# Patient Record
Sex: Male | Born: 1955
Health system: Southern US, Community
[De-identification: ages and names within clinical notes are randomized; demographics above are authoritative.]

## PROBLEM LIST (undated history)

## (undated) DIAGNOSIS — I10 Essential (primary) hypertension: Secondary | ICD-10-CM

## (undated) DIAGNOSIS — M792 Neuralgia and neuritis, unspecified: Secondary | ICD-10-CM

## (undated) DIAGNOSIS — Z720 Tobacco use: Secondary | ICD-10-CM

## (undated) DIAGNOSIS — C801 Malignant (primary) neoplasm, unspecified: Secondary | ICD-10-CM

## (undated) DIAGNOSIS — T4145XA Adverse effect of unspecified anesthetic, initial encounter: Secondary | ICD-10-CM

## (undated) DIAGNOSIS — T8859XA Other complications of anesthesia, initial encounter: Secondary | ICD-10-CM

## (undated) DIAGNOSIS — Z9889 Other specified postprocedural states: Secondary | ICD-10-CM

## (undated) DIAGNOSIS — N179 Acute kidney failure, unspecified: Secondary | ICD-10-CM

## (undated) DIAGNOSIS — Z9189 Other specified personal risk factors, not elsewhere classified: Secondary | ICD-10-CM

## (undated) DIAGNOSIS — N182 Chronic kidney disease, stage 2 (mild): Secondary | ICD-10-CM

## (undated) DIAGNOSIS — M5136 Other intervertebral disc degeneration, lumbar region: Secondary | ICD-10-CM

## (undated) DIAGNOSIS — M4800 Spinal stenosis, site unspecified: Secondary | ICD-10-CM

## (undated) DIAGNOSIS — Z8719 Personal history of other diseases of the digestive system: Secondary | ICD-10-CM

## (undated) DIAGNOSIS — R112 Nausea with vomiting, unspecified: Secondary | ICD-10-CM

## (undated) DIAGNOSIS — E7800 Pure hypercholesterolemia, unspecified: Secondary | ICD-10-CM

## (undated) DIAGNOSIS — M51369 Other intervertebral disc degeneration, lumbar region without mention of lumbar back pain or lower extremity pain: Secondary | ICD-10-CM

## (undated) HISTORY — PX: COLONOSCOPY: SHX174

## (undated) HISTORY — PX: HERNIA REPAIR: SHX51

## (undated) HISTORY — DX: Tobacco use: Z72.0

---

## 2002-05-24 ENCOUNTER — Ambulatory Visit (HOSPITAL_COMMUNITY): Admission: RE | Admit: 2002-05-24 | Discharge: 2002-05-24 | Payer: Self-pay | Admitting: Family Medicine

## 2002-05-24 ENCOUNTER — Encounter: Payer: Self-pay | Admitting: Family Medicine

## 2004-05-07 ENCOUNTER — Ambulatory Visit (HOSPITAL_COMMUNITY): Admission: RE | Admit: 2004-05-07 | Discharge: 2004-05-07 | Payer: Self-pay | Admitting: Family Medicine

## 2008-09-03 ENCOUNTER — Ambulatory Visit (HOSPITAL_COMMUNITY): Admission: RE | Admit: 2008-09-03 | Discharge: 2008-09-03 | Payer: Self-pay | Admitting: Family Medicine

## 2008-09-05 ENCOUNTER — Ambulatory Visit (HOSPITAL_COMMUNITY): Admission: RE | Admit: 2008-09-05 | Discharge: 2008-09-05 | Payer: Self-pay | Admitting: Family Medicine

## 2008-09-18 ENCOUNTER — Encounter (HOSPITAL_COMMUNITY): Admission: RE | Admit: 2008-09-18 | Discharge: 2008-09-30 | Payer: Self-pay | Admitting: Family Medicine

## 2008-10-09 ENCOUNTER — Ambulatory Visit: Payer: Self-pay | Admitting: Internal Medicine

## 2008-10-09 ENCOUNTER — Encounter: Payer: Self-pay | Admitting: Urgent Care

## 2008-10-09 LAB — CONVERTED CEMR LAB
ALT: 28 units/L (ref 0–53)
AST: 27 units/L (ref 0–37)
Albumin: 4.6 g/dL (ref 3.5–5.2)
Alkaline Phosphatase: 57 units/L (ref 39–117)
Amylase: 77 units/L (ref 0–105)
Bilirubin, Direct: 0.1 mg/dL (ref 0.0–0.3)
Lipase: 30 units/L (ref 0–75)
Total Bilirubin: 0.7 mg/dL (ref 0.3–1.2)
Total Protein: 6.8 g/dL (ref 6.0–8.3)

## 2008-11-07 ENCOUNTER — Ambulatory Visit (HOSPITAL_COMMUNITY): Admission: RE | Admit: 2008-11-07 | Discharge: 2008-11-07 | Payer: Self-pay | Admitting: Internal Medicine

## 2008-11-07 ENCOUNTER — Ambulatory Visit: Payer: Self-pay | Admitting: Internal Medicine

## 2008-12-04 ENCOUNTER — Encounter: Payer: Self-pay | Admitting: Gastroenterology

## 2008-12-04 ENCOUNTER — Ambulatory Visit: Payer: Self-pay | Admitting: Internal Medicine

## 2008-12-04 DIAGNOSIS — K7689 Other specified diseases of liver: Secondary | ICD-10-CM | POA: Insufficient documentation

## 2008-12-04 DIAGNOSIS — R1011 Right upper quadrant pain: Secondary | ICD-10-CM | POA: Insufficient documentation

## 2009-02-07 ENCOUNTER — Emergency Department (HOSPITAL_COMMUNITY): Admission: EM | Admit: 2009-02-07 | Discharge: 2009-02-07 | Payer: Self-pay | Admitting: Emergency Medicine

## 2010-01-05 ENCOUNTER — Telehealth (INDEPENDENT_AMBULATORY_CARE_PROVIDER_SITE_OTHER): Payer: Self-pay

## 2010-05-17 ENCOUNTER — Ambulatory Visit (HOSPITAL_COMMUNITY): Admission: RE | Admit: 2010-05-17 | Discharge: 2010-05-17 | Payer: Self-pay | Admitting: General Surgery

## 2010-11-02 NOTE — Progress Notes (Signed)
----   Converted from flag ---- ---- 01/05/2010 9:16 AM, Leanna Battles. Dixon Boos wrote: He can have with PCP  ---- 01/01/2010 4:35 PM, Hendricks Limes LPN wrote: do you want this pt to have lfts done? he was on recall list but there was not an order for it in the recall labs box. ------------------------------

## 2010-12-17 LAB — BASIC METABOLIC PANEL
Chloride: 105 mEq/L (ref 96–112)
GFR calc Af Amer: >60 mL/min (ref >60)
GFR calc non Af Amer: >60 mL/min (ref >60)
Potassium: 4.1 mEq/L (ref 3.5–5.1)
Sodium: 137 mEq/L (ref 135–145)

## 2010-12-17 LAB — SURGICAL PCR SCREEN
MRSA, PCR: NEGATIVE
Staphylococcus aureus: NEGATIVE

## 2010-12-17 LAB — HEMOGLOBIN AND HEMATOCRIT, BLOOD
HCT: 42.3 % (ref 39.0–52.0)
Hemoglobin: 14.6 g/dL (ref 13.0–17.0)

## 2011-02-15 NOTE — Op Note (Signed)
NAME:  [name] [name], [name] [name]                ACCOUNT NO.:  [id number]   MEDICAL RECORD NO.:  [id number]          PATIENT TYPE:  AMB   LOCATION:  DAY                           FACILITY:  [facility]   PHYSICIAN:  R. Roetta Sessions, M.D. DATE OF BIRTH:  December 02, 1955   DATE OF PROCEDURE:  11/07/2008  DATE OF DISCHARGE:                               OPERATIVE REPORT   PROCEDURE:  Diagnostic esophagogastroduodenoscopy followed by screening  colonoscopy.   INDICATIONS FOR PROCEDURE:  A 55 year old gentleman with 79-month history  of right upper quadrant abdominal pain, which began insidiously on a  trip abroad.  He localizes his discomfort to a small area with a  diameter of approximately an inch medial right upper quadrant along the  medial right costal margin.  Symptoms are not affected by movement,  eating, or bowel function.  Discomfort is unrelenting.  He wakes up with  it and goes to bed with it.  He denies any history of trauma.  LFTs,  amylase, lipase, and right upper quadrant ultrasound failed to  demonstrate the cause for his symptoms.  He does take Zetia for  hypercholesterolemia.  EGD is now being done to further evaluate his  symptoms.  Also, he has never had a colonoscopy and desires colorectal  cancer screening at the same time.  Risks, benefits, alternatives, and  limitations to this approach have been reviewed.   An endoscopy reexamined [name] [name] and he does have very superficial  tenderness along the right costal margin medially, particularly on the  inferior aspect of the right costal margin.  He does not have any  visceral abdominal discomfort to deep palpation.   PROCEDURE NOTE:  O2 saturation, blood pressure, pulse, and respirations  were monitored throughout the entirety of the both procedures.   CONSCIOUS SEDATION:  Versed 6 mg IV, Demerol 125 mg IV in divided doses,  and Phenergan 12.5 mg dilute slow IV push.   INSTRUMENT:  Pentax video chip system.   FINDINGS:  EGD examination  of the tubular esophagus revealed no mucosal  abnormalities.  EGD junction easily traversed.  Stomach:  Gastric cavity  was emptied and insufflated well with air.  Thorough examination of the  gastric mucosa including the retroflexed view of proximal stomach and  esophagogastric junction demonstrated small hiatal hernia; otherwise,  gastric mucosa appeared entirely normal.  Pylorus was patent and easily  traversed.  Examination of the bulb and second portion revealed no  abnormalities.  Therapeutic/diagnostic maneuvers, none.   The patient tolerated the procedure well and was prepared for  colonoscopy.  Digital rectal exam revealed no abnormalities.  Endoscopic  findings:  The prep was good.  Colon:  Colonic mucosa was surveyed from  the rectosigmoid junction through the left transverse, right colon, to  the appendiceal orifice, ileocecal valve, and cecum.  These structures  were well seen and photographed for the record.  From this level, the  scope was slowly withdrawn.  All previously mentioned mucosal surfaces  were again seen.  The colonic mucosa appeared entirely normal.  Scope  was pulled down the rectum where thorough examination  of the rectal  mucosa including retroflexed view of the distal rectum revealed no  rectal mucosal abnormalities.  The patient tolerated the both procedures  well and was reacted to Endoscopy.   IMPRESSION:  Esophagogastroduodenoscopy; normal esophagus, slightly  patulous esophagogastroduodenoscopy junction, small hiatal hernia;  otherwise, normal stomach, D1 and D2.  Colonoscopy findings; normal  rectum and colon.   DISCUSSION:  I believe [name] [name] has a very localized musculoskeletal  discomfort producing his right upper quadrant abdominal pain, which is  really more abdominal wall and right lower rib cage pain.   I do not believe he has any significant ongoing visceral pathology in  his GI tract.   He may have a sustained and occult musculus  skeletal strain along the  way.  In addition, I would also be concerned about the use of Zetia as  it can sometimes produce strange musculoskeletal symptoms.   RECOMMENDATIONS:  1. I told [name] [name] just to go ahead and stop the Zetia for 2 weeks      and see if this has any effect on his pain.  If the pain subsides      with this maneuver, then it will be very useful information.  2. If not, we would consider a course of Lidoderm patches and focus on      pain management physical therapy consultation.  3. He does have echogenic liver on ultrasound, but normal LFTs.  I      recommended moderation and minimization of alcohol consumption and      stressed regular aerobic exercise.  4. Consider repeat screening colonoscopy 10 years.  5. Followup appointment with [place] in 1 month.  He is to call us and let      [place] know how he is doing after he has been off of Zetia for 2 weeks.      [name] [name], M.D.  Electronically Signed     RMR/MEDQ  D:  11/07/2008  T:  11/07/2008  Job:  [id number]   cc:   [name] [name]

## 2011-02-15 NOTE — Consult Note (Signed)
NAME:  Jeanmarie, Zamarian                ACCOUNT NO.:  [id number]   MEDICAL RECORD NO.:  [id number]          PATIENT TYPE:  AMB   LOCATION:  DAY                           FACILITY:  [facility]   PHYSICIAN:  R. Roetta Sessions, M.D. DATE OF BIRTH:  1955/12/13   DATE OF CONSULTATION:  DATE OF DISCHARGE:                                 CONSULTATION   REQUESTING PHYSICIAN:  Corrie Mckusick, MD   REASON FOR CONSULTATION:  Right upper quadrant/epigastric pain.   HISTORY OF PRESENT ILLNESS:  Mr. Cindric is a 55 year old Caucasian male.  Approximately 6-7 weeks ago, he was traveling in [place].  While working,  he began to have right upper quadrant/epigastric pain.  He felt the  sensation of indigestion.  The area seem to be worse to touch.  He rates  the pain anywhere from 3-7/10.  He describes the pain as a nagging  aching pain.  At times, he feels like a pulled muscle.  He has noticed  increased number of loose stools.  He is having, what he describes as,  diarrhea with a couple of loose stools about twice per week.  Occasionally, he will have vomiting without any warning or nausea.  He  denies any radiation of his pain to his back or shoulders or chest.  He  denies any rectal bleeding, melena, or mucus in his stools.  He denies  any fever or chills.  He was in [place] for approximately 2 weeks.  He did  receive hepatitis A and B vaccines a while ago prior to his travels to  [place].  He consumes 2-3 beers or cocktails per week per his report.  His  weight has steadily increased since he stopped running 3 years ago.  He  denies any anorexia, denies any dysphagia or odynophagia.  He did do a 3-  day trial of Prilosec, but did not notice any relief.   LFTs were normal on July 31, 2008, as was CBC.  These were both prior  to the onset of his pain, however.  His triglycerides are normal and his  cholesterol is fine.  Abdominal ultrasound shows mild diffuse fatty  liver and a 6-mm common bile duct, which is  upper limits of normal.   PAST MEDICAL AND SURGICAL HISTORY:  Mild pancreatitis, etiology unknown  in 1992.  He tells me he had an EGD at the time, which was fine.  Hypertension, left inguinal herniorrhaphy, and wisdom teeth extraction.   CURRENT MEDICATIONS:  1. Benicar 40/25 mg daily.  2. Zetia 10 mg daily.  3. Aspirin 325 mg p.r.n.   ALLERGIES:  No known drug allergies.   FAMILY HISTORY:  There is no known family history of colorectal  carcinoma, liver, or chronic GI problems.  Mother at age 71 has diabetes  mellitus.  Father at age 77 is healthy.  He has 1 brother with history  of back injury.   SOCIAL HISTORY:  Mr. Thong is married.  He has 2 grown healthy  daughters.  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  He has a 15-pack-year history of  tobacco use.  He occasionally consumes alcohol as above.  He denies any  drug use.   REVIEW OF SYSTEMS:  See HPI; otherwise, negative.   PHYSICAL EXAMINATION:  VITAL SIGNS:  Weight 212.5 pounds, height 68  inches, temperature 98.4, blood pressure 122/90, and pulse 64.  GENERAL:  He is an obese Caucasian male who is alert, oriented,  pleasant, and cooperative in no acute distress.  HEENT:  Sclerae clear, nonicteric.  Conjunctivae pink.  Oropharynx pink  and moist without any lesions.  NECK:  Supple without mass or thyromegaly.  CHEST:  Heart regular rate and rhythm.  Normal S1 and S2 without  murmurs, clicks, rubs, or gallops.  LUNGS:  Clear to auscultation bilaterally.  ABDOMEN:  Protuberant.  Positive bowel sounds x4.  No bruits  auscultated.  Soft, nontender, and nondistended without palpable mass or  hepatosplenomegaly.  No rebound tenderness or guarding.  EXTREMITIES:  Without clubbing or edema.   IMPRESSION:  Mr. Donado is a 55 year old Caucasian male with a 6- to 7-  week history of constant right upper quadrant and epigastric pain.  He  has had a negative gallbladder workup.  I  suspect he may have gastritis  or peptic ulcer disease or refractory gastroesophageal reflux disease as  the culprit.  He also has had some intermittent vomiting.  He is also  noted to have fatty infiltration of the liver with normal LFTs in  October 2009 prior to the onset of his pain.  I doubt we are dealing  with viral hepatitis.  He has been vaccinated for hepatitis A and B.  I  suspect this is nonalcoholic steatohepatitis.   PLAN:  1. He is going to need colonoscopy with Dr. Jena Gauss in the near future.  2. I would like him to give a 2 weeks trial Zegerid 40 mg daily.  3. He is going to call with a progress report in 2 weeks.  If his      symptoms are not 100% resolved, he will undergo EGD at the time of      colonoscopy.  4. I have discussed risks of EGD and colonoscopy which included, but      not limited to bleeding, infection, perforation, or drug reaction.      He agrees with this plan and consent will be obtained.  5. Recommended gradual 1- to 2-pound weight loss per week through diet      and exercise.  6. Low-fat, low-cholesterol diet given  7. GERD diet given  8. We will check LFTs, amylase, and lipase given his history of      pancreatitis and fatty liver on ultrasound.   Thank you Dr. Phillips Odor for allowing [place] to participate in the care of Mr.  Caponi.      Lorenza Burton, N.P.      Jonathon Bellows, M.D.  Electronically Signed    KJ/MEDQ  D:  10/09/2008  T:  10/09/2008  Job:  [id number]   cc:   Corrie Mckusick, M.D.  Fax: [phone]

## 2011-05-04 HISTORY — PX: FINGER EXPLORATION: SHX1635

## 2011-05-17 ENCOUNTER — Emergency Department (HOSPITAL_COMMUNITY): Payer: 59

## 2011-05-17 ENCOUNTER — Inpatient Hospital Stay (HOSPITAL_COMMUNITY)
Admission: EM | Admit: 2011-05-17 | Discharge: 2011-05-18 | Disposition: A | Discharge status: Nursing Home (Includes ICF) | Payer: 59 | Source: Home / Self Care | Attending: Internal Medicine | Admitting: Internal Medicine

## 2011-05-17 DIAGNOSIS — L03019 Cellulitis of unspecified finger: Secondary | ICD-10-CM | POA: Diagnosis present

## 2011-05-17 DIAGNOSIS — L03119 Cellulitis of unspecified part of limb: Secondary | ICD-10-CM | POA: Diagnosis present

## 2011-05-17 DIAGNOSIS — Z7982 Long term (current) use of aspirin: Secondary | ICD-10-CM

## 2011-05-17 DIAGNOSIS — T63001A Toxic effect of unspecified snake venom, accidental (unintentional), initial encounter: Secondary | ICD-10-CM | POA: Diagnosis present

## 2011-05-17 DIAGNOSIS — IMO0001 Reserved for inherently not codable concepts without codable children: Secondary | ICD-10-CM | POA: Diagnosis present

## 2011-05-17 DIAGNOSIS — M79644 Pain in right finger(s): Secondary | ICD-10-CM | POA: Diagnosis present

## 2011-05-17 DIAGNOSIS — W5911XA Bitten by nonvenomous snake, initial encounter: Secondary | ICD-10-CM

## 2011-05-17 DIAGNOSIS — E782 Mixed hyperlipidemia: Secondary | ICD-10-CM | POA: Diagnosis present

## 2011-05-17 DIAGNOSIS — E785 Hyperlipidemia, unspecified: Secondary | ICD-10-CM | POA: Diagnosis present

## 2011-05-17 DIAGNOSIS — T6391XA Toxic effect of contact with unspecified venomous animal, accidental (unintentional), initial encounter: Principal | ICD-10-CM | POA: Diagnosis present

## 2011-05-17 DIAGNOSIS — T63121A Toxic effect of venom of other venomous lizard, accidental (unintentional), initial encounter: Secondary | ICD-10-CM | POA: Diagnosis present

## 2011-05-17 DIAGNOSIS — E7800 Pure hypercholesterolemia, unspecified: Secondary | ICD-10-CM | POA: Diagnosis present

## 2011-05-17 DIAGNOSIS — F17200 Nicotine dependence, unspecified, uncomplicated: Secondary | ICD-10-CM | POA: Diagnosis present

## 2011-05-17 DIAGNOSIS — I10 Essential (primary) hypertension: Secondary | ICD-10-CM | POA: Diagnosis present

## 2011-05-17 HISTORY — DX: Essential (primary) hypertension: I10

## 2011-05-17 HISTORY — DX: Pure hypercholesterolemia, unspecified: E78.00

## 2011-05-17 LAB — COMPREHENSIVE METABOLIC PANEL
ALT: 71 U/L — ABNORMAL HIGH (ref 0–53)
AST: 61 U/L — ABNORMAL HIGH (ref 0–37)
Albumin: 4.2 g/dL (ref 3.5–5.2)
CO2: 16 mEq/L — ABNORMAL LOW (ref 19–32)
Calcium: 9.4 mg/dL (ref 8.4–10.5)
Chloride: 96 mEq/L (ref 96–112)
Creatinine, Ser: 1.21 mg/dL (ref 0.50–1.35)
GFR calc non Af Amer: >60 mL/min (ref >60)
Sodium: 132 mEq/L — ABNORMAL LOW (ref 135–145)
Total Bilirubin: 0.3 mg/dL (ref 0.3–1.2)

## 2011-05-17 LAB — DIFFERENTIAL
Basophils Absolute: 0 10*3/uL (ref 0.0–0.1)
Basophils Relative: 0 % (ref 0–1)
Lymphocytes Relative: 35 % (ref 12–46)
Neutro Abs: 5 10*3/uL (ref 1.7–7.7)

## 2011-05-17 LAB — CBC
MCHC: 34.3 g/dL (ref 30.0–36.0)
Platelets: 226 10*3/uL (ref 150–400)
RDW: 12.3 % (ref 11.5–15.5)
WBC: 9.2 10*3/uL (ref 4.0–10.5)

## 2011-05-17 LAB — PROTIME-INR
INR: 0.92 (ref 0.00–1.49)
INR: 0.93 (ref 0.00–1.49)
Prothrombin Time: 12.6 seconds (ref 11.6–15.2)

## 2011-05-17 LAB — HEPATIC FUNCTION PANEL
ALT: 72 U/L — ABNORMAL HIGH (ref 0–53)
Alkaline Phosphatase: 65 U/L (ref 39–117)
Bilirubin, Direct: 0.1 mg/dL (ref 0.0–0.3)
Indirect Bilirubin: 0.2 mg/dL — ABNORMAL LOW (ref 0.3–0.9)

## 2011-05-17 LAB — PLATELET COUNT: Platelets: 224 10*3/uL (ref 150–400)

## 2011-05-17 LAB — FIBRINOGEN: Fibrinogen: 377 mg/dL (ref 204–475)

## 2011-05-17 MED ORDER — PROMETHAZINE HCL 25 MG/ML IJ SOLN: 12.5000 mg | Freq: Four times a day (QID) | INTRAMUSCULAR | Status: DC | PRN

## 2011-05-17 MED ORDER — ONDANSETRON HCL 4 MG/2ML IJ SOLN
4.0000 mg | Freq: Four times a day (QID) | INTRAMUSCULAR | Status: DC | PRN
  Administered 2011-05-18 (×2): 4 mg via INTRAVENOUS
  Filled 2011-05-17 (×2): qty 2

## 2011-05-17 MED ORDER — OLMESARTAN MEDOXOMIL-HCTZ 40-25 MG PO TABS: 1.0000 | ORAL_TABLET | Freq: Every day | ORAL | Status: DC

## 2011-05-17 MED ORDER — OMEGA-3-ACID ETHYL ESTERS 1 G PO CAPS
2.0000 g | ORAL_CAPSULE | Freq: Every day | ORAL | Status: DC
  Administered 2011-05-17 – 2011-05-18 (×2): 2 g via ORAL
  Filled 2011-05-17: qty 2
  Filled 2011-05-17: qty 1

## 2011-05-17 MED ORDER — TETANUS-DIPHTH-ACELL PERTUSSIS 5-2.5-18.5 LF-MCG/0.5 IM SUSP
0.5000 mL | Freq: Once | INTRAMUSCULAR | Status: AC
  Administered 2011-05-17: 0.5 mL via INTRAMUSCULAR
  Filled 2011-05-17: qty 0.5

## 2011-05-17 MED ORDER — TRAZODONE HCL 50 MG PO TABS
25.0000 mg | ORAL_TABLET | Freq: Every evening | ORAL | Status: DC | PRN
  Administered 2011-05-17: 25 mg via ORAL
  Filled 2011-05-17: qty 1

## 2011-05-17 MED ORDER — BISACODYL 10 MG RE SUPP: 10.0000 mg | RECTAL | Status: DC | PRN

## 2011-05-17 MED ORDER — ACETAMINOPHEN 325 MG PO TABS: 650.0000 mg | ORAL_TABLET | Freq: Four times a day (QID) | ORAL | Status: DC | PRN

## 2011-05-17 MED ORDER — DOCUSATE SODIUM 100 MG PO CAPS
100.0000 mg | ORAL_CAPSULE | Freq: Two times a day (BID) | ORAL | Status: DC
  Administered 2011-05-18: 100 mg via ORAL
  Filled 2011-05-17 (×2): qty 1

## 2011-05-17 MED ORDER — FLEET ENEMA 7-19 GM/118ML RE ENEM: 1.0000 | ENEMA | RECTAL | Status: DC | PRN

## 2011-05-17 MED ORDER — SENNOSIDES-DOCUSATE SODIUM 8.6-50 MG PO TABS: 1.0000 | ORAL_TABLET | Freq: Every day | ORAL | Status: DC | PRN

## 2011-05-17 MED ORDER — HYDROMORPHONE HCL 1 MG/ML IJ SOLN
1.0000 mg | INTRAMUSCULAR | Status: DC | PRN
  Administered 2011-05-17 – 2011-05-18 (×8): 1 mg via INTRAVENOUS
  Filled 2011-05-17 (×8): qty 1

## 2011-05-17 MED ORDER — PROMETHAZINE HCL 12.5 MG PO TABS: 12.5000 mg | ORAL_TABLET | Freq: Four times a day (QID) | ORAL | Status: DC | PRN

## 2011-05-17 MED ORDER — SODIUM CHLORIDE 0.9 % IV SOLN
4.0000 | Freq: Once | INTRAVENOUS | Status: AC
  Administered 2011-05-17: 40 mL via INTRAVENOUS
  Filled 2011-05-17: qty 40
  Filled 2011-05-17: qty 20

## 2011-05-17 MED ORDER — OLMESARTAN MEDOXOMIL 20 MG PO TABS
40.0000 mg | ORAL_TABLET | Freq: Every day | ORAL | Status: DC
  Administered 2011-05-18: 40 mg via ORAL
  Filled 2011-05-17: qty 2

## 2011-05-17 MED ORDER — ROSUVASTATIN CALCIUM 20 MG PO TABS
10.0000 mg | ORAL_TABLET | Freq: Every day | ORAL | Status: DC
  Filled 2011-05-17: qty 1

## 2011-05-17 MED ORDER — SODIUM CHLORIDE 0.9 % IV BOLUS (SEPSIS)
1000.0000 mL | Freq: Once | INTRAVENOUS | Status: AC
  Administered 2011-05-17: 1000 mL via INTRAVENOUS

## 2011-05-17 MED ORDER — OXYCODONE HCL 5 MG PO TABS
5.0000 mg | ORAL_TABLET | ORAL | Status: DC | PRN
  Administered 2011-05-17: 5 mg via ORAL
  Filled 2011-05-17: qty 1

## 2011-05-17 MED ORDER — SODIUM CHLORIDE 0.9 % IV SOLN
INTRAVENOUS | Status: AC
  Administered 2011-05-18: 10:00:00 via INTRAVENOUS

## 2011-05-17 MED ORDER — MORPHINE SULFATE 4 MG/ML IJ SOLN
4.0000 mg | INTRAMUSCULAR | Status: DC | PRN
  Administered 2011-05-17 (×2): 4 mg via INTRAVENOUS
  Filled 2011-05-17 (×2): qty 1

## 2011-05-17 MED ORDER — ACETAMINOPHEN 650 MG RE SUPP: 650.0000 mg | Freq: Four times a day (QID) | RECTAL | Status: DC | PRN

## 2011-05-17 MED ORDER — ONDANSETRON HCL 4 MG PO TABS: 4.0000 mg | ORAL_TABLET | Freq: Four times a day (QID) | ORAL | Status: DC | PRN

## 2011-05-17 MED ORDER — MORPHINE SULFATE 4 MG/ML IJ SOLN
4.0000 mg | Freq: Once | INTRAMUSCULAR | Status: AC
  Administered 2011-05-17: 4 mg via INTRAVENOUS
  Filled 2011-05-17: qty 1

## 2011-05-17 MED ORDER — HYDROCHLOROTHIAZIDE 25 MG PO TABS
25.0000 mg | ORAL_TABLET | Freq: Every day | ORAL | Status: DC
  Administered 2011-05-18: 25 mg via ORAL
  Filled 2011-05-17: qty 1

## 2011-05-17 MED ORDER — SODIUM CHLORIDE 0.9 % IV SOLN
INTRAVENOUS | Status: DC
  Filled 2011-05-17 (×5): qty 1000

## 2011-05-17 NOTE — ED Notes (Signed)
Patient noted to have swelling to right hand and fingers.   Patient placed on continuous cardiac monitoring, continuous pulse oximetry, and NBP cycling q 1 hour.  Patient's right hand elevated above the heart per poison control's request.. Patient alert/oriented x 4. NAD noted. Will continue to monitor.

## 2011-05-17 NOTE — Plan of Care (Signed)
Problem: Consults Goal: General Medical Patient Education See Patient Education Module for specific education. Pt bitten by copperhead while gardening at his home, wound to 2nd finger of right hand, measuring hourly. Goal: Skin Care Protocol Initiated - if indicated If consults are not indicated, leave blank or document N/A 2nd finger has aprox 25% blackened area rt snake bite, pt has received all of abo at this time. C/o throbing.  Goal: Nutrition Consult-if indicated Pt on heart healthy diet at this time.  Problem: Phase I Progression Outcomes Goal: Pain controlled with appropriate interventions Pt receiving morphine in ed will continue to monitior while on floor and act accordingly. Goal: Initial discharge plan identified Explained to pt will have to see how well cro-fab has worked before being discharged. Will continue to measure it hourly throughout stay. Goal: Hemodynamically stable Labs have been ordered. Goal: Other Phase I Outcomes/Goals Pt appers to be tolerating situation well, family at bedside.

## 2011-05-17 NOTE — ED Notes (Signed)
Spoke with Merdis Delay at [facility].  Was instructed to elevate hand above heart with minimal flexion of elbow.  Measure finger, hand, wrist, and forearm for baseline measurements and reassess at least every 2 hours.  In 6 hours post animal bite obtain Platelet count, fibrinogen, and PT. Indications for crofab would be significant pain or swelling, or swelling surpasses a major joint like wrist.    Notifed Dr. Bernette Mayers and pt's primary nurse, Lake Bells RN.

## 2011-05-17 NOTE — H&P (Signed)
PCP:   Colette Ribas, MD   Chief Complaint:  Snake bite about 3:15pm today.  HPI: 54yo CM with h/o HTN, hyperlipidemia, bitten by copperhead snake in the grass today. Arrived in the emergency room at about 4 PM. While in the emergency room her swelling has progressed despite symptomatic measures and patient is now been started on CroFab antivenom. His only complaints are pain in the hand area  Other than the above complete review of systems unremarkable. He denies diabetes. He denies alcohol abuse he now denies alcohol use today  He declined tobacco abuse counseling times a nicotine patch.  Review of Systems:  The patient denies anorexia, fever, weight loss,, vision loss, decreased hearing, hoarseness, chest pain, syncope, dyspnea on exertion, peripheral edema, balance deficits, hemoptysis, abdominal pain, melena, hematochezia, severe indigestion/heartburn, hematuria, incontinence, genital sores, muscle weakness, suspicious skin lesions, transient blindness, difficulty walking, depression, unusual weight change, abnormal bleeding, enlarged lymph nodes, angioedema, and breast masses.  Past Medical History: Past Medical History  Diagnosis Date  . Hypertension   . Hypercholesterolemia    Past Surgical History  Procedure Date  . Hernia repair     as a child    Medications: Prior to Admission medications   Medication Sig Start Date End Date Taking? Authorizing Provider  aspirin 325 MG EC tablet Take 325-650 mg by mouth daily.     Yes Historical Provider, MD  Multiple Vitamins-Minerals (CENTRUM ULTRA MENS) TABS Take 1 tablet by mouth daily.     Yes Historical Provider, MD  olmesartan-hydrochlorothiazide (BENICAR HCT) 40-25 MG per tablet Take 1 tablet by mouth daily.     Yes Historical Provider, MD  Omega-3 Fatty Acids (FISH OIL) 1000 MG CAPS Take 1 capsule by mouth daily.     Yes Historical Provider, MD  rosuvastatin (CRESTOR) 10 MG tablet Take 10 mg by mouth at bedtime.     Yes  Historical Provider, MD    Allergies:  No Known Allergies  Social History:  reports that he has been smoking.  He does not have any smokeless tobacco history on file. He reports that he drinks alcohol. He reports that he does not use illicit drugs.  Smokes 1ppd.  Family History: Family History  Problem Relation Age of Onset  . Diabetes Mother     Physical Exam: Filed Vitals:   05/17/11 1851 05/17/11 1942 05/17/11 2029 05/17/11 2204  BP: 108/66 102/58 130/89 146/92  Pulse: 99 90 88 95  Temp:  98.2 F (36.8 C) 98.1 F (36.7 C) 98.1 F (36.7 C)  TempSrc:  Oral Oral Oral  Resp: 15 16 14 18   Height:   5\' 8"  (1.727 m)   Weight:   98.4 kg (216 lb 14.9 oz)   SpO2: 95% 94% 97% 95%   General appearance: alert, cooperative and moderately obese Head: Normocephalic, without obvious abnormality, atraumatic Eyes: conjunctivae/corneas clear. PERRL, EOM's intact. Throat: lips, mucosa, and tongue normal; teeth and gums normal Neck: thick neck; no adenopathy, no carotid bruit, no JVD, supple, symmetrical, trachea midline and thyroid not enlarged, symmetric, no tenderness/mass/nodules Resp: clear to auscultation bilaterally Chest wall: no tenderness Cardio: regular rate and rhythm, S1, S2 normal, no murmur, click, rub or gallop GI: obese, soft, non-tender; bowel sounds normal; no masses,  no organomegaly Extremities: Swelling and inflamation right index finger, with edema of hand and wrist. Black necrosis of skin onthe medial aspect of  the middle segment of the finger. Pulses: 2+ and symmetric Skin: other than noted above, skin color, texture,  turgor normal. No rashes or lesions Neurologic: Grossly normal   Labs on Admission:   [facility] 05/17/11 1626  NA 132*  K 3.5  CL 96  CO2 16*  GLUCOSE 109*  BUN 21  CREATININE 1.21  CALCIUM 9.4  MG --  PHOS --    Basename 05/17/11 2114 05/17/11 1626  AST 64* 61*  ALT 72* 71*  ALKPHOS 65 63  BILITOT 0.3 0.3  PROT 7.2 7.5  ALBUMIN  4.3 4.2   No results found for this basename: LIPASE:2,AMYLASE:2 in the last 72 hours  Basename 05/17/11 2030 05/17/11 1626  WBC -- 9.2  NEUTROABS -- 5.0  HGB -- 14.8  HCT -- 43.1  MCV -- 96.4  PLT 224 226   No results found for this basename: CKTOTAL:3,CKMB:3,CKMBINDEX:3,TROPONINI:3 in the last 72 hours No results found for this basename: TSH,T4TOTAL,FREET3,T3FREE,THYROIDAB in the last 72 hours No results found for this basename: VITAMINB12:2,FOLATE:2,FERRITIN:2,TIBC:2,IRON:2,RETICCTPCT:2 in the last 72 hours  Radiological Exams on Admission: Dg Finger Index Right  05/17/2011  *RADIOLOGY REPORT*  Clinical Data: Copper head bite to right index finger, puncture wounds over PIP joint.  RIGHT INDEX FINGER 2+V  Comparison: None.  Findings:  There is minimal soft tissue swelling about the dorsal surface of the PIP joint of the index finger.  No radiopaque foreign body.  No fracture or dislocation.  IMPRESSION: Soft tissue swelling about the dorsal soft tissues of the PIP joint of the index finger without radiopaque foreign body.  Original Report Authenticated By: Waynard Reeds, M.D.    Assessment/Plan Present on Admission:  .Snake bite poisoning .Swelling of second finger of right hand .Pain in finger of right hand .HTN (hypertension) .Hyperlipidemia  PLAN: Admit this gentleman for pain control and elevation and monitoring of the swelling and response to treatment. If any evidence of vascular compromise develops will consult a hand surgeon. Will monitor for development of coagulopathy.  Will continue his home medication.  Other plans as per orders.  Rily Nickey 05/17/2011, 11:18 PM

## 2011-05-17 NOTE — ED Notes (Signed)
Measurements  Right second finger: 4 cm Right hand: 21.5 cm Right wrist: 14.5 cm Right Elbow: 25 cm

## 2011-05-17 NOTE — ED Notes (Signed)
Measurements:  Right index finger: 4 cm Right hand: 19 cm Right wrist: 13.5 cm Right elbow: 25 cm

## 2011-05-17 NOTE — ED Provider Notes (Signed)
History     CSN: [id number] Arrival date & time: 05/17/2011  4:20 PM  Chief Complaint  Patient presents with  . Animal Bite   HPI Pt reports he was clearing weeds in his yard just prior to arrival when he was bit by a snake. He is confident it was a copperhead which bit him on the R index finger. Complaining of moderate aching pain, worse with movement and associated with swelling. No lightheadedness or dizziness. No fever. He has had some EtOH today.  Past Medical History  Diagnosis Date  . Hypertension   . Hypercholesterolemia     Past Surgical History  Procedure Date  . Hernia repair     as a child    Family History  Problem Relation Age of Onset  . Diabetes Mother     History  Substance Use Topics  . Smoking status: Current Everyday Smoker  . Smokeless tobacco: Not on file  . Alcohol Use: Yes     drinks liqour occasionally, last time around noon.        Review of Systems All other systems reviewed and are negative except as noted in HPI.   Physical Exam  BP 110/33  Pulse 104  Temp(Src) 98.9 F (37.2 C) (Oral)  Ht 5\' 8"  (1.727 m)  Wt 205 lb (92.987 kg)  BMI 31.17 kg/m2  SpO2 97%  Physical Exam  Nursing note and vitals reviewed. Constitutional: He is oriented to person, place, and time. He appears well-developed and well-nourished.  HENT:  Head: Normocephalic and atraumatic.  Eyes: EOM are normal. Pupils are equal, round, and reactive to light.  Neck: Normal range of motion. Neck supple.  Cardiovascular: Normal rate, normal heart sounds and intact distal pulses.   Pulmonary/Chest: Effort normal and breath sounds normal.  Abdominal: Bowel sounds are normal. He exhibits no distension. There is no tenderness.  Musculoskeletal: Normal range of motion. He exhibits edema and tenderness.       Ecchymosis and swelling to R index finger with puncture wounds. Erythema spreading towards the hand just proximal to the MCP joint  Neurological: He is alert and  oriented to person, place, and time. No cranial nerve deficit.  Skin: Skin is warm and dry. No rash noted.  Psychiatric: He has a normal mood and affect.    ED Course  Procedures  MDM Pt with increased swelling and pain, now extending to his wrist. Per Poison Control recommendations, will begin CroFab and admit. Dr. Orvan Falconer to see.     Jaking Thayer B. Bernette Mayers, MD 05/17/11 1904

## 2011-05-17 NOTE — ED Notes (Signed)
Spoke with [place] with poison control. Clinical indications and lab relayed to [facility] per request about patient condition. No further instructions or recommendations received from poison control.

## 2011-05-17 NOTE — ED Notes (Signed)
Crofab started at 9ml/hr with RN in room for first 10 minutes

## 2011-05-17 NOTE — ED Notes (Signed)
No reaction noted - crofab increased to 270ml/hr

## 2011-05-17 NOTE — ED Notes (Signed)
Pt pulled some weeds at his pool and snake bit pt's right index finger.  3 puncture marks noted.  Finger swollen, bruised, and red.  Pt describes pain as throbbing.  Pt says snake was a copper head.  Says body of snake was approx the same size as pt's wrist.  EMS has marked swelling upon their arrival and reports area had spread by the time they arrived to ED.  EMS marked 2nd area of redness.

## 2011-05-18 ENCOUNTER — Inpatient Hospital Stay (HOSPITAL_COMMUNITY)
Admission: AD | Admit: 2011-05-18 | Discharge: 2011-05-19 | DRG: 918 | Disposition: A | Discharge status: Home / Self Care | Payer: 59 | Source: Other Acute Inpatient Hospital | Attending: Emergency Medicine | Admitting: Emergency Medicine

## 2011-05-18 ENCOUNTER — Encounter (HOSPITAL_COMMUNITY): Payer: Self-pay

## 2011-05-18 DIAGNOSIS — IMO0001 Reserved for inherently not codable concepts without codable children: Secondary | ICD-10-CM

## 2011-05-18 DIAGNOSIS — L03119 Cellulitis of unspecified part of limb: Secondary | ICD-10-CM | POA: Diagnosis present

## 2011-05-18 DIAGNOSIS — T63001A Toxic effect of unspecified snake venom, accidental (unintentional), initial encounter: Secondary | ICD-10-CM

## 2011-05-18 DIAGNOSIS — L03019 Cellulitis of unspecified finger: Secondary | ICD-10-CM | POA: Diagnosis present

## 2011-05-18 DIAGNOSIS — T6391XA Toxic effect of contact with unspecified venomous animal, accidental (unintentional), initial encounter: Principal | ICD-10-CM

## 2011-05-18 LAB — COMPREHENSIVE METABOLIC PANEL
AST: 40 U/L — ABNORMAL HIGH (ref 0–37)
Albumin: 3.6 g/dL (ref 3.5–5.2)
Calcium: 8.4 mg/dL (ref 8.4–10.5)
Creatinine, Ser: 1.14 mg/dL (ref 0.50–1.35)

## 2011-05-18 MED ORDER — POTASSIUM CHLORIDE IN NACL 20-0.9 MEQ/L-% IV SOLN: INTRAVENOUS | Status: DC

## 2011-05-18 MED ORDER — HYDROMORPHONE HCL 1 MG/ML IJ SOLN
2.0000 mg | INTRAMUSCULAR | Status: DC | PRN
  Administered 2011-05-18 (×2): 2 mg via INTRAVENOUS
  Filled 2011-05-18 (×2): qty 2

## 2011-05-18 MED ORDER — AMPICILLIN-SULBACTAM SODIUM 3 (2-1) G IJ SOLR
INTRAMUSCULAR | Status: AC
  Filled 2011-05-18: qty 3

## 2011-05-18 MED ORDER — SODIUM CHLORIDE 0.9 % IV SOLN
3.0000 g | Freq: Four times a day (QID) | INTRAVENOUS | Status: DC
  Administered 2011-05-18: 3 g via INTRAVENOUS
  Filled 2011-05-18 (×4): qty 3

## 2011-05-18 MED ORDER — SODIUM CHLORIDE 0.9 % IV SOLN
INTRAVENOUS | Status: DC
  Administered 2011-05-18: 20:00:00 via INTRAVENOUS

## 2011-05-18 NOTE — Discharge Summary (Signed)
NAME:  [name] [name], [name] [name] NO.:  [id number]  MEDICAL RECORD NO.:  [id number]  LOCATION:  A319                          FACILITY:  [facility]  PHYSICIAN:  Tarry Kos, MD       DATE OF BIRTH:  Feb 26, 1956  DATE OF ADMISSION:  05/17/2011 DATE OF DISCHARGE:  LH                         DISCHARGE SUMMARY-REFERRING   REASON FOR TRANSFER:  Per Dr. Mort Sawyers recommendations, the patient with copper head snakebite to the right hand on May 17, 2011 at approximately 3:15 p.m. in the afternoon.  SUMMARY OF HOSPITAL COURSE:  [name] [name] is a 55 year old male who came to the ED last night after experiencing copper head snakebite.  He had some status swelling, second finger of the right hand.  He received CroFab.  The next day, orthopedic surgeon, Dr. Romeo Apple subsequently saw the patient and felt the patient needs to be transferred as he was not hand surgeon and cannot adequately follow this patient's hand.  He thought that he was possibly developing cellulitis and septic arthritis and started him on Unasyn.  He has not shown any signs of developing coagulopathy.  He will be transferred to [facility] to [facility] Team [facility]6 to a monitored bed.  We will continue him on Unasyn 3 g IV q.6 h, Dilaudid for pain.  I will check a CBC, fibrinogen level coags in the morning and will need to get a hand surgeon to see him in the morning. The patient is being transferred in stable condition.  He has been afebrile.  His vital signs have been stable.  He is a full code.                                           ______________________________ Tarry Kos, MD     RD/MEDQ  D:  05/18/2011  T:  05/18/2011  Job:  [id number]

## 2011-05-18 NOTE — Progress Notes (Addendum)
Measurement of Rt. Hand:26cm; Rt. Wrist:22cm; Rt. Forearm: 27cm; Rt. Upper arm: 34cm. Care link called for information for transfer talked to them.

## 2011-05-18 NOTE — Progress Notes (Signed)
Measurement of Rt. Hand:26cm; Rt. Wrist:20.5cm; Rt. Forearm: 31cm; Rt. Upper arm: 33.5cm.

## 2011-05-18 NOTE — Progress Notes (Signed)
Measurements of Rt. Hand: 26.5cm; Rt. Wrist: 20cm; Rt. Forearm: 31cm; Rt. Upper Arm: 33.5cm

## 2011-05-18 NOTE — Progress Notes (Signed)
Measurements of Rt hand: 26cm; Rt wrist: 20cm; Rt. Forearm: 30.5cm; Rt upper arm: 33.5cm.

## 2011-05-18 NOTE — Progress Notes (Signed)
Subjective: Is concerned about swelling of his right index finger. Says no one has done anything for him since his arrival.  Objective: Vital signs in last 24 hours: Temp:  [98.1 F (36.7 C)-98.7 F (37.1 C)] 98.7 F (37.1 C) (08/15 1446) Pulse Rate:  [82-99] 82  (08/15 1446) Resp:  [14-18] 16  (08/15 1446) BP: (101-146)/(58-92) 115/76 mmHg (08/15 1446) SpO2:  [92 %-97 %] 94 % (08/15 1446) Weight:  [98.4 kg (216 lb 14.9 oz)] 216 lb 14.9 oz (98.4 kg) (08/14 2029) Weight change:  Last BM Date: 05/17/11  Intake/Output from previous day:     Physical Exam: General: Alert, awake, oriented x3, in no acute distress. HEENT: No bruits, no goiter. Heart: Regular rate and rhythm, without murmurs, rubs, gallops. Lungs: Clear to auscultation bilaterally. Abdomen: Soft, nontender, nondistended, positive bowel sounds. Extremities: No clubbing cyanosis or edema with positive pedal pulses. Right hand swollen to about the midforearm there is some mild erythema around the dorsum of the hand around the knuckle area. Right index finger is markedly swollen and necrotic. He has good wrist pulses. Neuro: Grossly intact, nonfocal.    Lab Results:  Basename 05/17/11 2030 05/17/11 1626  WBC -- 9.2  HGB -- 14.8  HCT -- 43.1  PLT 224 226    Basename 05/18/11 0447 05/17/11 1626  NA 133* 132*  K 4.8 3.5  CL 101 96  CO2 18* 16*  GLUCOSE 103* 109*  BUN 24* 21  CREATININE 1.14 1.21  CALCIUM 8.4 9.4    Studies/Results: Dg Finger Index Right  05/17/2011  *RADIOLOGY REPORT*  Clinical Data: Copper head bite to right index finger, puncture wounds over PIP joint.  RIGHT INDEX FINGER 2+V  Comparison: None.  Findings:  There is minimal soft tissue swelling about the dorsal surface of the PIP joint of the index finger.  No radiopaque foreign body.  No fracture or dislocation.  IMPRESSION: Soft tissue swelling about the dorsal soft tissues of the PIP joint of the index finger without radiopaque foreign  body.  Original Report Authenticated By: Waynard Reeds, M.D.    Medications: I have reviewed the patient's current medications.  Assessment/Plan:  1.  Snake bite poisoning: Received antivenom in the emergency department. He is extremely concerned about the swelling of his right index finger. We'll see if orthopedics here can have a look at it and see if they have any further recommendations. 2.  Swelling of second finger of right hand: See above. 3.  HTN (hypertension): Well controlled. 4.  Hyperlipidemia    LOS: 1 day   HERNANDEZ ACOSTA,ESTELA 05/18/2011, 4:30 PM

## 2011-05-18 NOTE — Progress Notes (Signed)
Measurement of Rt. Hand:26cm; Rt. Wrist:20cm; Rt forearm: 31cm; Rt upper arm:34cm

## 2011-05-18 NOTE — Consult Note (Signed)
Reason for Consult:swelling of the hand and finger  Referring Physician: Dr [name] is an 55 y.o. male.  HPI: Right-hand-dominant male employed in [employer] industry currently between jobs was bitten by a copperhead last night. He received antivenom. He was observed for compartment syndrome. At approximately 3:30 PM this afternoon I was asked to see the patient regarding his request for second opinion regarding his finger. He complains of pain and swelling and increasing redness in the right index finger.  Past Medical History  Diagnosis Date  . Hypertension   . Hypercholesterolemia     Past Surgical History  Procedure Date  . Hernia repair     as a child    Family History  Problem Relation Age of Onset  . Diabetes Mother     Social History:  reports that he has been smoking.  He does not have any smokeless tobacco history on file. He reports that he drinks alcohol. He reports that he does not use illicit drugs.  Allergies: No Known Allergies  Medications: I have reviewed the patient's current medications.  Results for orders placed during the hospital encounter of 05/17/11 (from the past 48 hour(s))  CBC     Status: Normal   Collection Time   05/17/11  4:26 PM      Component Value Range Comment   WBC 9.2  4.0 - 10.5 (K/uL)    RBC 4.47  4.22 - 5.81 (MIL/uL)    Hemoglobin 14.8  13.0 - 17.0 (g/dL)    HCT 98.1  19.1 - 47.8 (%)    MCV 96.4  78.0 - 100.0 (fL)    MCH 33.1  26.0 - 34.0 (pg)    MCHC 34.3  30.0 - 36.0 (g/dL)    RDW 29.5  62.1 - 30.8 (%)    Platelets 226  150 - 400 (K/uL)   DIFFERENTIAL     Status: Normal   Collection Time   05/17/11  4:26 PM      Component Value Range Comment   Neutrophils Relative 54  43 - 77 (%)    Neutro Abs 5.0  1.7 - 7.7 (K/uL)    Lymphocytes Relative 35  12 - 46 (%)    Lymphs Abs 3.2  0.7 - 4.0 (K/uL)    Monocytes Relative 10  3 - 12 (%)    Monocytes Absolute 0.9  0.1 - 1.0 (K/uL)    Eosinophils Relative 1  0 - 5  (%)    Eosinophils Absolute 0.1  0.0 - 0.7 (K/uL)    Basophils Relative 0  0 - 1 (%)    Basophils Absolute 0.0  0.0 - 0.1 (K/uL)   COMPREHENSIVE METABOLIC PANEL     Status: Abnormal   Collection Time   05/17/11  4:26 PM      Component Value Range Comment   Sodium 132 (*) 135 - 145 (mEq/L)    Potassium 3.5  3.5 - 5.1 (mEq/L)    Chloride 96  96 - 112 (mEq/L)    CO2 16 (*) 19 - 32 (mEq/L)    Glucose, Bld 109 (*) 70 - 99 (mg/dL)    BUN 21  6 - 23 (mg/dL)    Creatinine, Ser 6.57  0.50 - 1.35 (mg/dL)    Calcium 9.4  8.4 - 10.5 (mg/dL)    Total Protein 7.5  6.0 - 8.3 (g/dL)    Albumin 4.2  3.5 - 5.2 (g/dL)    AST 61 (*) 0 - 37 (  U/L)    ALT 71 (*) 0 - 53 (U/L)    Alkaline Phosphatase 63  39 - 117 (U/L)    Total Bilirubin 0.3  0.3 - 1.2 (mg/dL)    GFR calc non Af Amer >60  >60 (mL/min)    GFR calc Af Amer >60  >60 (mL/min)   APTT     Status: Normal   Collection Time   05/17/11  4:26 PM      Component Value Range Comment   aPTT 29  24 - 37 (seconds)   PROTIME-INR     Status: Normal   Collection Time   05/17/11  4:26 PM      Component Value Range Comment   Prothrombin Time 12.6  11.6 - 15.2 (seconds)    INR 0.92  0.00 - 1.49    PROTIME-INR     Status: Normal   Collection Time   05/17/11  8:30 PM      Component Value Range Comment   Prothrombin Time 12.7  11.6 - 15.2 (seconds)    INR 0.93  0.00 - 1.49    FIBRINOGEN     Status: Normal   Collection Time   05/17/11  8:30 PM      Component Value Range Comment   Fibrinogen 377  204 - 475 (mg/dL)   PLATELET COUNT     Status: Normal   Collection Time   05/17/11  8:30 PM      Component Value Range Comment   Platelets 224  150 - 400 (K/uL)   HEPATIC FUNCTION PANEL     Status: Abnormal   Collection Time   05/17/11  9:14 PM      Component Value Range Comment   Total Protein 7.2  6.0 - 8.3 (g/dL)    Albumin 4.3  3.5 - 5.2 (g/dL)    AST 64 (*) 0 - 37 (U/L)    ALT 72 (*) 0 - 53 (U/L)    Alkaline Phosphatase 65  39 - 117 (U/L)    Total  Bilirubin 0.3  0.3 - 1.2 (mg/dL)    Bilirubin, Direct 0.1  0.0 - 0.3 (mg/dL)    Indirect Bilirubin 0.2 (*) 0.3 - 0.9 (mg/dL)   COMPREHENSIVE METABOLIC PANEL     Status: Abnormal   Collection Time   05/18/11  4:47 AM      Component Value Range Comment   Sodium 133 (*) 135 - 145 (mEq/L)    Potassium 4.8  3.5 - 5.1 (mEq/L) DELTA CHECK NOTED   Chloride 101  96 - 112 (mEq/L)    CO2 18 (*) 19 - 32 (mEq/L)    Glucose, Bld 103 (*) 70 - 99 (mg/dL)    BUN 24 (*) 6 - 23 (mg/dL)    Creatinine, Ser 5.62  0.50 - 1.35 (mg/dL)    Calcium 8.4  8.4 - 10.5 (mg/dL)    Total Protein 6.0  6.0 - 8.3 (g/dL)    Albumin 3.6  3.5 - 5.2 (g/dL)    AST 40 (*) 0 - 37 (U/L)    ALT 52  0 - 53 (U/L)    Alkaline Phosphatase 51  39 - 117 (U/L)    Total Bilirubin 0.6  0.3 - 1.2 (mg/dL)    GFR calc non Af Amer >60  >60 (mL/min)    GFR calc Af Amer >60  >60 (mL/min)     Dg Finger Index Right  05/17/2011  *RADIOLOGY REPORT*  Clinical Data: Copper head bite to right index  finger, puncture wounds over PIP joint.  RIGHT INDEX FINGER 2+V  Comparison: None.  Findings:  There is minimal soft tissue swelling about the dorsal surface of the PIP joint of the index finger.  No radiopaque foreign body.  No fracture or dislocation.  IMPRESSION: Soft tissue swelling about the dorsal soft tissues of the PIP joint of the index finger without radiopaque foreign body.  Original Report Authenticated By: Waynard Reeds, M.D.    Review of Systems  Constitutional: Negative for fever and chills.  Eyes: Negative for blurred vision.  Respiratory: Negative for cough.   Cardiovascular: Negative for chest pain.  Gastrointestinal: Negative for heartburn.  Genitourinary: Negative for dysuria.  Musculoskeletal: Positive for myalgias.  Skin: Negative for itching and rash.  Neurological: Negative for dizziness and headaches.  Endo/Heme/Allergies: Does not bruise/bleed easily.  Psychiatric/Behavioral: Negative for depression.   Blood pressure  115/76, pulse 82, temperature 98.7 F (37.1 C), temperature source Oral, resp. rate 16, height 5\' 8"  (1.727 m), weight 98.4 kg (216 lb 14.9 oz), SpO2 94.00%. Physical Exam  Constitutional: He is oriented to person, place, and time. He appears well-developed and well-nourished.  HENT:  Head: Normocephalic and atraumatic.  Eyes: Pupils are equal, round, and reactive to light.  Neck: Normal range of motion.  Cardiovascular: Normal rate and regular rhythm.   Respiratory: Effort normal and breath sounds normal.  Musculoskeletal: He exhibits edema and tenderness.       Right shoulder: Normal.       Right elbow: Normal.      Right wrist: Normal.       Arms: Lymphadenopathy:    He has no cervical adenopathy.    He has no axillary adenopathy.       Left: No epitrochlear adenopathy present.  Neurological: He is alert and oriented to person, place, and time. He has normal reflexes. He displays no atrophy. No sensory deficit. He exhibits normal muscle tone.  Skin: Skin is warm, dry and intact. Ecchymosis noted. There is erythema. Nails show no clubbing.     Psychiatric: He has a normal mood and affect. His speech is normal and behavior is normal. Judgment and thought content normal. Cognition and memory are normal.    Assessment/Plan: X-ray was taken in the emergency room it was negative. I believe he has progressing cellulitis of the index finger and possible septic arthritis of the PIP joint as the puncture wounds are over the joint. The redness and cellulitic areas have progressed to the dorsum of the hand.  He is not on antibiotics I've advised that we start Unasyn. I've also advised that he be transferred to a facility that has a hand surgeon in case he needs incision and drainage.  [name] [name] 05/18/2011, 6:29 PM

## 2011-05-18 NOTE — Progress Notes (Signed)
Talked to [name] at [facility] and gave report on the patient

## 2011-05-19 LAB — PROTIME-INR: INR: 1.02 (ref 0.00–1.49)

## 2011-05-19 LAB — URINALYSIS, MICROSCOPIC ONLY
Glucose, UA: NEGATIVE mg/dL
Hgb urine dipstick: NEGATIVE
Specific Gravity, Urine: 1.015 (ref 1.005–1.030)

## 2011-05-19 LAB — FIBRINOGEN: Fibrinogen: 421 mg/dL (ref 204–475)

## 2011-05-19 LAB — CBC
MCH: 32.5 pg (ref 26.0–34.0)
Platelets: 196 10*3/uL (ref 150–400)
RBC: 3.72 MIL/uL — ABNORMAL LOW (ref 4.22–5.81)
WBC: 8.1 10*3/uL (ref 4.0–10.5)

## 2011-05-19 LAB — APTT: aPTT: 33 seconds (ref 24–37)

## 2011-06-01 NOTE — Consult Note (Signed)
[name] [name], [name] [name] NO.:  [id number]  MEDICAL RECORD NO.:  [id number]  LOCATION:  2507                         FACILITY:  [facility]  PHYSICIAN:  Betha Loa, MD        DATE OF BIRTH:  [birth year]  DATE OF CONSULTATION:  05/19/2011 DATE OF DISCHARGE:  05/19/2011                                CONSULTATION   REASON FOR CONSULTATION:  Snake bite injury, right index finger.  HISTORY:  [name] [name] is a 55 year old right-hand dominant male who is here with his family members.  He reports that 2 days ago, he was bitten on the right index finger by what he states as a copperhead.  His daughter saw it as well and stated it was a copperhead.  He was taken to the [facility] by EMS and was admitted overnight.  He was given antivenin and Unasyn was started the following day.  He was seen by Orthopedics the following day and it was felt that transfer to [facility] was appropriate.  This was performed last night.  He was admitted by Hospitalist.  He reports no injuries other than the right index finger.  It is painful but his pain is controlled with medication.  He has had a couple of low-grade fevers and some sweats.  ALLERGIES:  No known drug allergies.  PAST MEDICAL HISTORY:  Hypertension.  PAST SURGICAL HISTORY:  Hernia as a child and skin biopsy on his neck.  PREOPERATIVE MEDICATIONS:  Benicar, fish oil, p.r.n. aspirin, and vitamins.  SOCIAL HISTORY:  [name] [name] smokes 1-1/2 packs per day x20 years and drinks alcohol occasionally.  FAMILY HISTORY:  Positive for diabetes.  REVIEW OF SYSTEMS:  Denies chills, chest pain, shortness breath, nausea, vomiting, diarrhea, constipation, easy bleeding or bruising.  PHYSICAL EMANATION:  VITAL SIGNS:  Temperature 98.1, pulse 79, respirations 16, blood pressure 116/75. HEENT:  Head normocephalic, atraumatic. NECK:  Supple.  Full range of motion. GENERAL:  He is resting comfortably in the hospital bed.  He is  alert and oriented x3. EXTREMITIES:  The left upper extremity has intact sensation and capillary refill in all fingertips.  He can flex and extend the IP joint of his thumb and can cross his fingers.  He has good capillary refill. He has no wounds, no tenderness to palpation.  Right upper extremity with the exception of the index finger has intact sensation.  He has some decreased sensation at the tip of the index finger.  He has good capillary refill in all fingertips.  He can flex and extend the IP joint of thumb and can abduct his fingers.  He has swelling in the index finger and a hemorrhagic bulla dorsally.  There is some erythema in the finger and lightly over the MP joint.  This does not track more proximally and there is no streaking.  He is swollen slightly in the hand and forearm, but his compartments are soft.  He can wiggle his fingers.  It is tender to palpation over the distal aspect of the index finger.  He has no tenderness at the MP joint.  There is serous drainage coming from the bulla.  RADIOGRAPHS:  AP, lateral, and oblique views of the index finger show no fractures, dislocations, or radiopaque foreign bodies.  There is soft tissue swelling.  LAB RESULTS:  White blood count 8.1, hemoglobin 12.1, hematocrit 36.4, platelets 196.  Sodium 133, potassium 4.8, chloride 101, CO2 18, glucose 103, BUN 24, creatinine 1.14.  Alk phos 51, AST 40, ALT 52, protein 6.0, calcium 8.4.  ASSESSMENT AND PLAN:  Right index finger snake bite.  This appears to be mostly toxic reaction at this point.  I discussed the nature of snake bite on admission with Mr. Denham.  At this point, I recommended unroofing the bulla dorsally, elevation in a machine sling and IV pole and hydrotherapy.  Continue on antibiotics.  He is okay to be discharged per Hand Surgery on oral antibiotics.  We will follow up with him next week.  If he has any problems prior to that, he can call the office and we will  have him seen sooner.  He agrees with the plan of care.  DESCRIPTION OF PROCEDURE:  The bulla on the dorsum of the index finger was unroofed proximally using sterile scissors with prepping of the skin with Betadine.  There is serous expression from underneath.  The finger was then dressed with sterile 4x4s and wrapped with a Kerlix bandage.     Betha Loa, MD     KK/MEDQ  D:  05/19/2011  T:  05/19/2011  Job:  [id number]  Electronically Signed by Betha Loa  on 06/01/2011 04:14:25 PM

## 2011-06-07 ENCOUNTER — Ambulatory Visit (HOSPITAL_BASED_OUTPATIENT_CLINIC_OR_DEPARTMENT_OTHER)
Admission: RE | Admit: 2011-06-07 | Discharge: 2011-06-07 | Disposition: A | Discharge status: Home / Self Care | Payer: 59 | Source: Ambulatory Visit | Attending: Orthopedic Surgery | Admitting: Orthopedic Surgery

## 2011-06-07 DIAGNOSIS — S61209A Unspecified open wound of unspecified finger without damage to nail, initial encounter: Secondary | ICD-10-CM | POA: Insufficient documentation

## 2011-06-07 DIAGNOSIS — T63121A Toxic effect of venom of other venomous lizard, accidental (unintentional), initial encounter: Secondary | ICD-10-CM | POA: Insufficient documentation

## 2011-06-07 DIAGNOSIS — IMO0002 Reserved for concepts with insufficient information to code with codable children: Secondary | ICD-10-CM | POA: Insufficient documentation

## 2011-06-07 DIAGNOSIS — T63001A Toxic effect of unspecified snake venom, accidental (unintentional), initial encounter: Secondary | ICD-10-CM | POA: Insufficient documentation

## 2011-06-07 LAB — POCT I-STAT, CHEM 8
BUN: 20 mg/dL (ref 6–23)
Calcium, Ion: 1.14 mmol/L (ref 1.12–1.32)
Glucose, Bld: 118 mg/dL — ABNORMAL HIGH (ref 70–99)
TCO2: 21 mmol/L (ref 0–100)

## 2011-06-08 NOTE — Progress Notes (Signed)
Encounter addended by: Clarene Critchley on: 06/08/2011  3:03 PM<BR>     Documentation filed: Flowsheet VN

## 2011-06-10 NOTE — Op Note (Signed)
NAME:  [name] [name], [name] [name] NO.:  [id number]  MEDICAL RECORD NO.:  [id number]  LOCATION:                                 FACILITY:  PHYSICIAN:  Betha Loa, MD        DATE OF BIRTH:  April 07, 1956  DATE OF PROCEDURE:  06/07/2011 DATE OF DISCHARGE:                              OPERATIVE REPORT   PREOPERATIVE DIAGNOSES:  Right index finger snake bite with eschar.  POSTOPERATIVE DIAGNOSES:  Right index finger snake bite with eschar.  PROCEDURE PERFORMED:  Right index finger excision of eschar and full- thickness skin grafting to defect approximately 1 x 3 cm.  SURGEON:  Betha Loa, M.D.  ASSISTANCE:  Cindee Salt, M.D.  ANESTHESIA:  General.  IV FLUIDS:  Per anesthesia flow sheet.  ESTIMATED BLOOD LOSS:  Minimal.  COMPLICATIONS:  None.  SPECIMENS:  None.  TOURNIQUET TIME:  47 minutes.  DISPOSITION:  Stable to PACU.  INDICATIONS:  [name] [name] is a 55 year old male who was been bitten approximately 2 weeks ago by copperhead on his right index finger.  I followed him up in the office.  His swelling and redness decreased, but there was an eschar forming on the dorsum of the index finger at the level of the middle phalanx.  I discussed with [name] [name] the nature of snake bites, and we discussed potential for escharotomy and full- thickness skin grafting to prevent contracture and to allow the wound to heal sooner.  Risks, benefits, and alternatives of procedure were discussed including the risk of blood loss, infection, damage to nerves, vessels, tendons, ligaments, bone, failure to surgery, need for additional surgery, complications with wound healing, continued pain, failure of the graft.  He voiced understanding of these risks and elected to proceed.  PROCEDURE IN DETAIL:  After being identified preoperatively by myself, the patient & I agreed upon the procedure and site of procedure.  The surgical site was marked.  Risks, benefits, alternative to surgery  were reviewed and he wished to proceed.  Surgical consent had been signed. He was given 1 g of IV Ancef as preoperative antibiotic prophylaxis.  He was transferred to the operating room and placed on the operating table in supine position with the right upper extremity on arm board.  General anesthesia was induced by the anesthesiologist.  The right upper extremity was prepped and draped in normal sterile orthopedic fashion. A surgical pause was performed between surgeons, Anesthesia, and operating staff, and all were agreement as to the patient procedure and site of procedure.  Tourniquet to proximal aspect of the extremity was inflated to 250 mmHg after exsanguination of limb with an Esmarch bandage.  Escharotomy was performed.  This is approximately 1 x 3 cm in size.  There had been some necrosis of subcutaneous tissues.  The tenon appeared to be intact.  There were some thrombosed veins.  These were excised and bipolar was used to ensure adequate hemostasis.  Once all necrotic tissue had been removed, the wound was measured and its shape transferred to the volar aspect of the wrist.  This area was chosen for the full-thickness skin grafting because there was no hair.  There  was significant amount of hair at  the proximal forearm and distal upper arm.  An ellipsoid full-thickness skin graft was taken at the level of the wrist in transverse fashion.  This was defatted.  The defect was closed primarily using 4-0 nylon suture.  The full-thickness graft was transferred to the defect on the finger.  It was held in place using a running 5-0 chromic suture.  A bolster was tied over top including Xeroform, fluff gauze, and cotton batting.  This was held on with 4-0 nylon suture in a crossed fashion over top of the bolster.  The wound at the wrist was injected with 3 mL of 0.25% plain Marcaine.  A digital block was performed with 6 mL of 0.25% plain Marcaine to aid in postoperative analgesia.   The wound at the wrist was dressed with sterile Xeroform and 4 x 4s, and the hand was wrapped with Kerlix.  A volar splint was placed with the wrist in neutral.  The MP was slightly flexed and the IP was extended.  This was wrapped with Kerlix and Ace bandage.  The tourniquet was deflated at 47 minutes.  Fingertips were pink with brisk capillary refill after deflation of the tourniquet. Operative drapes were broken down and the patient was awoken from anesthesia safely.  He was transferred back to the stretcher and taken to PACU in stable condition.  I will see him back in the office in 1 week for postprocedure follow-up.  I will give him Percocet 5/325 one to two p.o. q.6h. p.r.n. pain, dispensed #40.     Betha Loa, MD     KK/MEDQ  D:  06/07/2011  T:  06/08/2011  Job:  [id number]  Electronically Signed by Betha Loa  on 06/10/2011 09:06:34 AM

## 2011-06-21 NOTE — H&P (Signed)
NAME:  [name] [name]                ACCOUNT NO.:  [id number]  MEDICAL RECORD NO.:  [id number]  LOCATION:                                 FACILITY:  PHYSICIAN:  Wiley Magan, DO         DATE OF BIRTH:  Jun 16, 1956  DATE OF ADMISSION: DATE OF DISCHARGE:                             HISTORY & PHYSICAL   CHIEF COMPLAINT:  Copperhead snake bite to finger.  HISTORY OF PRESENT ILLNESS:  The patient is a 55 year old male who presented to the emergency room at [facility] after a bite to his second digit on his right hand after clearing some weeds out from [place] bushes. The patient was seen by Orthopedic Surgery there.  He was started on IV antibiotics and then later that day early in the early morning hours of May 19, 2011, he was transferred to [facility] for consultation with a Hydrographic surveyor.  PAST MEDICAL HISTORY:  Significant for hypertension.  PAST SURGICAL HISTORY:  None.  MEDICATIONS AT HOME: 1. Benicar 20 mg 1 p.o. daily. 2. Fish oil tablets 1000 mg 1 p.o. daily. 3. Multivitamin 1 p.o. daily.  ALLERGIES:  NKDA.  REVIEW OF SYSTEMS:  CONSTITUTIONAL:  Negative for fever.  Negative for chills.  Negative for weakness.  Negative for fatigue.  CNS:  No headaches.  No seizures.  No limb weakness.  ENT:  No nasal congestion. No throat pain.  No coryza.  CARDIOVASCULAR:  No chest pain.  No palpitations.  No orthopnea.  RESPIRATORY:  No cough.  No shortness breath.  No wheezing.  GASTROINTESTINAL:  No nausea.  No vomiting.  No constipation.  No diarrhea.  GENITOURINARY:  No dysuria.  No hematuria. No urinary frequency.  RENAL:  No flank pain.  No swelling.  No pruritus.  SKIN:  No rashes.  No sores.  No lesions.  HEMATOLOGICAL:  No easy bruising.  No purpura.  No clots.  LYMPH:  No lymphadenopathy.  No painful nodes.  No specific lymph swelling.  PSYCHIATRIC:  No anxiety. No depression.  No insomnia.  FAMILY HISTORY:  Negative for coronary artery disease.  Negative  for cancer.  SOCIAL HISTORY:  No tobacco.  Occasional alcohol.  No recreational drug use.  PHYSICAL EXAMINATION:  VITAL SIGNS:  Temperature 98.1, pulse 79, respiratory rate 16, blood pressure 116/75, and O2 sat 94% on room air. GENERAL:  The patient is awake, alert, and ordered x3, in no acute distress. EYES:  Pupils are equal, round, and reactive to light and accommodation. External ocular movements bilaterally are intact.  Sclerae are nonicteric and noninjected. MOUTH:  Oral mucosa moist.  No lesions.  No sores.  Pharynx is clear. No erythema.  No exudate. NECK:  Negative for JVD.  Negative for thyromegaly.  Negative for lymphadenopathy. HEART:  Regular rate and rhythm at 80 beats per minute without murmur, ectopy, or gallops.  No lateral PMI.  No thrills. LUNGS:  Clear to auscultation bilaterally without wheezes, rales, or rhonchi.  No increase work of breathing.  No tactile fremitus. ABDOMEN:  Protuberant, soft, nontender, and nondistended.  Positive bowel sounds.  No hepatosplenomegaly or hernias palpated. CARDIOVASCULAR:  Extremities are negative for  cyanosis, clubbing, or edema.  He has fair dorsalis pedis and popliteal pulses bilaterally.  No carotid bruits bilaterally. NEUROLOGICAL:  Cranial nerves II-XII are grossly intact.  Motor and sensory are intact.  LABORATORY DATA:  WBC 8.1, hemoglobin 12.1, hematocrit 36.4, and platelets 196.  PT 13.6, INR 1.02, PTT is 33, fibrinogen is 421. Urinalysis is negative.  ASSESSMENT: 1. Copperhead bite to second digit of right hand. 2. Hypertension. 3. Inflammation and blistering of second digit on right hand.  PLAN:  Consult hand surgeon to evaluate hand, continue IV antibiotics and pain control.          ______________________________ Fran Lowes, DO     AS/MEDQ  D:  05/19/2011  T:  05/20/2011  Job:  [id number]  cc:   Dr. Elonda Husky  Electronically Signed by Fran Lowes DO on 06/21/2011 10:35:21 PM

## 2011-06-21 NOTE — Discharge Summary (Signed)
  NAME:  [name] [name], CAPOZZI                ACCOUNT NO.:  [id number]  MEDICAL RECORD NO.:  [id number]  LOCATION:  2507                         FACILITY:  [facility]  PHYSICIAN:  Elexius Minar, DO         DATE OF BIRTH:  1956-08-19  DATE OF ADMISSION:  05/18/2011 DATE OF DISCHARGE:  05/19/2011                              DISCHARGE SUMMARY   ADMISSION DIAGNOSES: 1. Snakebite, inflammation and blistering of second digit right hand. 2. Hypertension.  DISCHARGE DIAGNOSES: 1. Snakebite to his second digit right hand. 2. Inflammation and blistering, second digit right hand. 3. Hypertension.  HOSPITAL COURSE:  The patient was admitted.  His IV antibiotics were continued.  He was given pain control.  Dr. Merlyn Lot was consulted for Hand Surgery.  He evaluated the patient.  He unroofed some of the blood blisters and the patient had a hydrotherapy on that hand.  Dr. Merlyn Lot also demonstrated to the patient how he can do hydrotherapy at home and how he could bandage that hand himself at home.  The patient was told to keep the hand elevated over the level of his heart that has been done. Dr. Merlyn Lot stated that he was comfortable with the patient going home with p.o. antibiotics.  The patient is comfortable with Dr. Merrilee Seashore instructions.  The patient is doing well.  He is eating.  He is afebrile and he will be discharged home in good condition.  DISCHARGE DIAGNOSES: 1. Snakebite, second digit right hand. 2. Inflammation and blistering, second digit right hand. 3. Hypertension.  DISCHARGE INSTRUCTIONS:  Include keep hand elevated above the level of his heart.  He is to have hydrotherapy on that hand daily.  He is to dress the hand as demonstrated by Dr. Merlyn Lot.  Diet is cardiac.  Activity is as tolerated.  MEDICATIONS FOR DISCHARGE: 1. Oxycodone/acetaminophen 5/325 one p.o. q.4 hours p.r.n. pain.  The     patient may repeat x1 after the first tablet if pain is unrelieved. 2. Benicar/hydrochlorothiazide 40/25  one p.o. daily. 3. Fish oil 1 p.o. daily. 4. Multivitamin 1 p.o. daily.  ALLERGIES:  To ROSUVASTATIN.  The patient is to follow up with Dr. Elonda Husky in [place] in 2-4 weeks and with Dr. Merlyn Lot within 1 week.  I spent 30 minutes for this discharge.          ______________________________ Fran Lowes, DO     AS/MEDQ  D:  05/19/2011  T:  05/20/2011  Job:  [id number]  cc:   Dr. Merlyn Lot Dr. Elonda Husky  Electronically Signed by Fran Lowes DO on 06/21/2011 10:35:19 PM

## 2011-11-22 ENCOUNTER — Encounter (HOSPITAL_BASED_OUTPATIENT_CLINIC_OR_DEPARTMENT_OTHER): Payer: Self-pay | Admitting: *Deleted

## 2011-11-22 ENCOUNTER — Other Ambulatory Visit: Payer: Self-pay | Admitting: Orthopedic Surgery

## 2011-11-22 NOTE — Progress Notes (Signed)
Pt here 8/12 with snakebite rt index-still drg-infected Needs istat-had ekg 8/12

## 2011-11-24 ENCOUNTER — Encounter (HOSPITAL_BASED_OUTPATIENT_CLINIC_OR_DEPARTMENT_OTHER): Payer: Self-pay | Admitting: Certified Registered Nurse Anesthetist

## 2011-11-24 ENCOUNTER — Encounter (HOSPITAL_BASED_OUTPATIENT_CLINIC_OR_DEPARTMENT_OTHER)
Admission: RE | Disposition: A | Discharge status: Home / Self Care | Payer: Self-pay | Source: Ambulatory Visit | Attending: Orthopedic Surgery

## 2011-11-24 ENCOUNTER — Ambulatory Visit (HOSPITAL_BASED_OUTPATIENT_CLINIC_OR_DEPARTMENT_OTHER): Payer: 59 | Admitting: Certified Registered Nurse Anesthetist

## 2011-11-24 ENCOUNTER — Encounter (HOSPITAL_BASED_OUTPATIENT_CLINIC_OR_DEPARTMENT_OTHER): Payer: Self-pay

## 2011-11-24 ENCOUNTER — Encounter (HOSPITAL_BASED_OUTPATIENT_CLINIC_OR_DEPARTMENT_OTHER): Payer: Self-pay | Admitting: Orthopedic Surgery

## 2011-11-24 ENCOUNTER — Ambulatory Visit (HOSPITAL_BASED_OUTPATIENT_CLINIC_OR_DEPARTMENT_OTHER)
Admission: RE | Admit: 2011-11-24 | Discharge: 2011-11-24 | Disposition: A | Discharge status: Home / Self Care | Payer: 59 | Source: Ambulatory Visit | Attending: Orthopedic Surgery | Admitting: Orthopedic Surgery

## 2011-11-24 DIAGNOSIS — T8140XA Infection following a procedure, unspecified, initial encounter: Secondary | ICD-10-CM | POA: Insufficient documentation

## 2011-11-24 DIAGNOSIS — Y832 Surgical operation with anastomosis, bypass or graft as the cause of abnormal reaction of the patient, or of later complication, without mention of misadventure at the time of the procedure: Secondary | ICD-10-CM | POA: Insufficient documentation

## 2011-11-24 DIAGNOSIS — F17200 Nicotine dependence, unspecified, uncomplicated: Secondary | ICD-10-CM | POA: Insufficient documentation

## 2011-11-24 DIAGNOSIS — I10 Essential (primary) hypertension: Secondary | ICD-10-CM | POA: Insufficient documentation

## 2011-11-24 DIAGNOSIS — E7800 Pure hypercholesterolemia, unspecified: Secondary | ICD-10-CM | POA: Insufficient documentation

## 2011-11-24 HISTORY — DX: Other complications of anesthesia, initial encounter: T88.59XA

## 2011-11-24 HISTORY — DX: Other specified postprocedural states: R11.2

## 2011-11-24 HISTORY — PX: I & D EXTREMITY: SHX5045

## 2011-11-24 HISTORY — DX: Adverse effect of unspecified anesthetic, initial encounter: T41.45XA

## 2011-11-24 HISTORY — DX: Other specified postprocedural states: Z98.890

## 2011-11-24 LAB — POCT I-STAT, CHEM 8
Chloride: 106 mEq/L (ref 96–112)
Creatinine, Ser: 1.4 mg/dL — ABNORMAL HIGH (ref 0.50–1.35)
Glucose, Bld: 106 mg/dL — ABNORMAL HIGH (ref 70–99)
HCT: 45 % (ref 39.0–52.0)
Hemoglobin: 15.3 g/dL (ref 13.0–17.0)
Potassium: 4.4 mEq/L (ref 3.5–5.1)
Sodium: 134 mEq/L — ABNORMAL LOW (ref 135–145)

## 2011-11-24 SURGERY — IRRIGATION AND DEBRIDEMENT EXTREMITY
Anesthesia: General | Laterality: Right

## 2011-11-24 MED ORDER — PROPOFOL 10 MG/ML IV EMUL
INTRAVENOUS | Status: DC | PRN
  Administered 2011-11-24: 200 mg via INTRAVENOUS

## 2011-11-24 MED ORDER — FENTANYL CITRATE 0.05 MG/ML IJ SOLN
INTRAMUSCULAR | Status: DC | PRN
Start: 2011-11-24 — End: 2011-11-24
  Administered 2011-11-24: 25 ug via INTRAVENOUS
  Administered 2011-11-24: 100 ug via INTRAVENOUS

## 2011-11-24 MED ORDER — ONDANSETRON HCL 4 MG/2ML IJ SOLN
INTRAMUSCULAR | Status: DC | PRN
  Administered 2011-11-24 (×2): 4 mg via INTRAVENOUS

## 2011-11-24 MED ORDER — EPHEDRINE SULFATE 50 MG/ML IJ SOLN
INTRAMUSCULAR | Status: DC | PRN
  Administered 2011-11-24 (×3): 10 mg via INTRAVENOUS

## 2011-11-24 MED ORDER — CHLORHEXIDINE GLUCONATE 4 % EX LIQD: 60.0000 mL | Freq: Once | CUTANEOUS | Status: DC

## 2011-11-24 MED ORDER — BUPIVACAINE HCL (PF) 0.25 % IJ SOLN
INTRAMUSCULAR | Status: DC | PRN
  Administered 2011-11-24: 3 mL

## 2011-11-24 MED ORDER — DEXAMETHASONE SODIUM PHOSPHATE 10 MG/ML IJ SOLN: INTRAMUSCULAR | Status: DC | PRN

## 2011-11-24 MED ORDER — LIDOCAINE HCL (CARDIAC) 20 MG/ML IV SOLN
INTRAVENOUS | Status: DC | PRN
  Administered 2011-11-24: 60 mg via INTRAVENOUS

## 2011-11-24 MED ORDER — DROPERIDOL 2.5 MG/ML IJ SOLN
INTRAMUSCULAR | Status: DC | PRN
  Administered 2011-11-24: 0.625 mg via INTRAVENOUS

## 2011-11-24 MED ORDER — SULFAMETHOXAZOLE-TRIMETHOPRIM 800-160 MG PO TABS: 1.0000 | ORAL_TABLET | Freq: Two times a day (BID) | ORAL | Status: DC

## 2011-11-24 MED ORDER — LACTATED RINGERS IV SOLN
INTRAVENOUS | Status: DC
  Administered 2011-11-24 (×2): via INTRAVENOUS

## 2011-11-24 MED ORDER — MIDAZOLAM HCL 5 MG/5ML IJ SOLN
INTRAMUSCULAR | Status: DC | PRN
  Administered 2011-11-24: 2 mg via INTRAVENOUS

## 2011-11-24 MED ORDER — OXYCODONE-ACETAMINOPHEN 5-325 MG PO TABS
1.0000 | ORAL_TABLET | Freq: Once | ORAL | Status: AC
  Administered 2011-11-24: 1 via ORAL

## 2011-11-24 MED ORDER — OXYCODONE-ACETAMINOPHEN 5-325 MG PO TABS: ORAL_TABLET | ORAL | Status: AC

## 2011-11-24 SURGICAL SUPPLY — 52 items
BAG DECANTER FOR FLEXI CONT (MISCELLANEOUS) IMPLANT
BANDAGE ELASTIC 3 VELCRO ST LF (GAUZE/BANDAGES/DRESSINGS) IMPLANT
BANDAGE GAUZE ELAST BULKY 4 IN (GAUZE/BANDAGES/DRESSINGS) IMPLANT
BANDAGE GAUZE STRT 1 STR LF (GAUZE/BANDAGES/DRESSINGS) IMPLANT
BLADE MINI RND TIP GREEN BEAV (BLADE) IMPLANT
BLADE SURG 15 STRL LF DISP TIS (BLADE) ×2 IMPLANT
BLADE SURG 15 STRL SS (BLADE) ×4
BNDG CMPR 9X4 STRL LF SNTH (GAUZE/BANDAGES/DRESSINGS) ×1
BNDG CMPR MD 5X2 ELC HKLP STRL (GAUZE/BANDAGES/DRESSINGS)
BNDG COHESIVE 1X5 TAN STRL LF (GAUZE/BANDAGES/DRESSINGS) ×1 IMPLANT
BNDG ELASTIC 2 VLCR STRL LF (GAUZE/BANDAGES/DRESSINGS) IMPLANT
BNDG ESMARK 4X9 LF (GAUZE/BANDAGES/DRESSINGS) ×1 IMPLANT
CHLORAPREP W/TINT 26ML (MISCELLANEOUS) ×2 IMPLANT
CLOTH BEACON ORANGE TIMEOUT ST (SAFETY) ×2 IMPLANT
CORDS BIPOLAR (ELECTRODE) ×2 IMPLANT
COVER MAYO STAND STRL (DRAPES) ×2 IMPLANT
COVER TABLE BACK 60X90 (DRAPES) ×2 IMPLANT
CUFF TOURNIQUET SINGLE 18IN (TOURNIQUET CUFF) ×2 IMPLANT
DRAPE EXTREMITY T 121X128X90 (DRAPE) ×2 IMPLANT
DRAPE OEC MINIVIEW 54X84 (DRAPES) ×1 IMPLANT
DRAPE SURG 17X23 STRL (DRAPES) ×2 IMPLANT
GAUZE PACKING IODOFORM 1/4X5 (PACKING) ×1 IMPLANT
GAUZE XEROFORM 1X8 LF (GAUZE/BANDAGES/DRESSINGS) ×1 IMPLANT
GLOVE BIO SURGEON STRL SZ7.5 (GLOVE) ×2 IMPLANT
GLOVE BIOGEL PI IND STRL 8 (GLOVE) ×1 IMPLANT
GLOVE BIOGEL PI INDICATOR 8 (GLOVE) ×1
GOWN PREVENTION PLUS XLARGE (GOWN DISPOSABLE) ×2 IMPLANT
GOWN STRL REIN XL XLG (GOWN DISPOSABLE) ×2 IMPLANT
LOOP VESSEL MAXI BLUE (MISCELLANEOUS) IMPLANT
NDL HYPO 25X1 1.5 SAFETY (NEEDLE) IMPLANT
NEEDLE HYPO 25X1 1.5 SAFETY (NEEDLE) ×2 IMPLANT
NS IRRIG 1000ML POUR BTL (IV SOLUTION) ×2 IMPLANT
PACK BASIN DAY SURGERY FS (CUSTOM PROCEDURE TRAY) ×2 IMPLANT
PAD CAST 3X4 CTTN HI CHSV (CAST SUPPLIES) IMPLANT
PADDING CAST ABS 4INX4YD NS (CAST SUPPLIES) ×1
PADDING CAST ABS COTTON 4X4 ST (CAST SUPPLIES) ×1 IMPLANT
PADDING CAST COTTON 3X4 STRL (CAST SUPPLIES)
SPLINT FINGER 5/8X3.25 (SOFTGOODS) IMPLANT
SPLINT FINGER FOAM 3 9119 05 (SOFTGOODS) ×2
SPLINT PLASTER CAST XFAST 3X15 (CAST SUPPLIES) IMPLANT
SPLINT PLASTER XTRA FASTSET 3X (CAST SUPPLIES)
SPONGE GAUZE 4X4 12PLY (GAUZE/BANDAGES/DRESSINGS) ×2 IMPLANT
STOCKINETTE 4X48 STRL (DRAPES) ×2 IMPLANT
SUT ETHILON 4 0 PS 2 18 (SUTURE) IMPLANT
SWAB CULTURE LIQ STUART DBL (MISCELLANEOUS) ×1 IMPLANT
SYR BULB 3OZ (MISCELLANEOUS) ×2 IMPLANT
SYR CONTROL 10ML LL (SYRINGE) ×1 IMPLANT
TOWEL OR 17X24 6PK STRL BLUE (TOWEL DISPOSABLE) ×4 IMPLANT
TUBE ANAEROBIC SPECIMEN COL (MISCELLANEOUS) ×1 IMPLANT
TUBE FEEDING 5FR 15 INCH (TUBING) IMPLANT
UNDERPAD 30X30 INCONTINENT (UNDERPADS AND DIAPERS) ×2 IMPLANT
WATER STERILE IRR 1000ML POUR (IV SOLUTION) ×1 IMPLANT

## 2011-11-24 NOTE — Anesthesia Preprocedure Evaluation (Signed)
Anesthesia Evaluation  Patient identified by MRN, date of birth, ID band Patient awake    Reviewed: Allergy & Precautions, H&P , NPO status , Patient's Chart, lab work & pertinent test results  History of Anesthesia Complications (+) PONV  Airway Mallampati: I TM Distance: >3 FB Neck ROM: Full    Dental No notable dental hx. (+) Teeth Intact and Dental Advisory Given   Pulmonary Current Smoker,  clear to auscultation  Pulmonary exam normal       Cardiovascular hypertension, Pt. on medications Regular Normal    Neuro/Psych Negative Neurological ROS     GI/Hepatic negative GI ROS, Neg liver ROS,   Endo/Other  Negative Endocrine ROS  Renal/GU negative Renal ROS     Musculoskeletal   Abdominal (+) obese,   Peds  Hematology negative hematology ROS (+)   Anesthesia Other Findings   Reproductive/Obstetrics                           Anesthesia Physical Anesthesia Plan  ASA: II  Anesthesia Plan: General   Post-op Pain Management:    Induction: Intravenous  Airway Management Planned: LMA  Additional Equipment:   Intra-op Plan:   Post-operative Plan:   Informed Consent: I have reviewed the patients History and Physical, chart, labs and discussed the procedure including the risks, benefits and alternatives for the proposed anesthesia with the patient or authorized representative who has indicated his/her understanding and acceptance.   Dental advisory given  Plan Discussed with: CRNA and Surgeon  Anesthesia Plan Comments: (Plan routine monitors, GA- LMA OK)        Anesthesia Quick Evaluation

## 2011-11-24 NOTE — Op Note (Signed)
Dictation [id number]

## 2011-11-24 NOTE — Brief Op Note (Signed)
11/24/2011  11:48 AM  PATIENT:  Johny Blamer  56 y.o. male  PRE-OPERATIVE DIAGNOSIS:  right index finger infection  POST-OPERATIVE DIAGNOSIS:  right index finger infection  PROCEDURE:  Procedure(s) (LRB): IRRIGATION AND DEBRIDEMENT EXTREMITY (Right)  SURGEON:  Surgeon(s) and Role:    * Tami Ribas, MD - Primary  PHYSICIAN ASSISTANT:   ASSISTANTS: none   ANESTHESIA:   general  EBL:  Total I/O In: 1000 [I.V.:1000] Out: -   BLOOD ADMINISTERED:none  DRAINS: iodoform packing  LOCAL MEDICATIONS USED:  MARCAINE     SPECIMEN:  Source of Specimen:  right index pip joint  DISPOSITION OF SPECIMEN:  micro  COUNTS:  YES  TOURNIQUET:   Total Tourniquet Time Documented: Forearm (Right) - 30 minutes  DICTATION: .Other Dictation: Dictation Number [id number]  PLAN OF CARE: Discharge to home after PACU  PATIENT DISPOSITION:  PACU - hemodynamically stable.

## 2011-11-24 NOTE — Anesthesia Procedure Notes (Signed)
Procedure Name: LMA Insertion Date/Time: 11/24/2011 11:05 AM Performed by: Tamir Wallman D Pre-anesthesia Checklist: Patient identified, Emergency Drugs available, Suction available and Patient being monitored Patient Re-evaluated:Patient Re-evaluated prior to inductionOxygen Delivery Method: Circle System Utilized Preoxygenation: Pre-oxygenation with 100% oxygen Intubation Type: IV induction Ventilation: Mask ventilation without difficulty LMA: LMA inserted LMA Size: 5.0 Number of attempts: 1 Placement Confirmation: positive ETCO2 Tube secured with: Tape Dental Injury: Teeth and Oropharynx as per pre-operative assessment

## 2011-11-24 NOTE — Transfer of Care (Signed)
Immediate Anesthesia Transfer of Care Note  Patient: [name] [name]  Procedure(s) Performed: Procedure(s) (LRB): IRRIGATION AND DEBRIDEMENT EXTREMITY (Right)  Patient Location: PACU  Anesthesia Type: General  Level of Consciousness: awake, alert , oriented and patient cooperative  Airway & Oxygen Therapy: Patient Spontanous Breathing and Patient connected to face mask oxygen  Post-op Assessment: Report given to PACU RN and Post -op Vital signs reviewed and stable  Post vital signs: Reviewed and stable  Complications: No apparent anesthesia complications

## 2011-11-24 NOTE — Anesthesia Postprocedure Evaluation (Signed)
  Anesthesia Post-op Note  Patient: [name] [name]  Procedure(s) Performed: Procedure(s) (LRB): IRRIGATION AND DEBRIDEMENT EXTREMITY (Right)  Patient Location: PACU  Anesthesia Type: General  Level of Consciousness: awake, alert  and oriented  Airway and Oxygen Therapy: Patient Spontanous Breathing  Post-op Pain: none  Post-op Assessment: Post-op Vital signs reviewed, Patient's Cardiovascular Status Stable, Respiratory Function Stable, Patent Airway, No signs of Nausea or vomiting and Pain level controlled  Post-op Vital Signs: Reviewed and stable  Complications: No apparent anesthesia complications

## 2011-11-24 NOTE — Discharge Instructions (Addendum)
[facility] Instructions Hand Surgery  Wound Care: Keep your hand elevated above the level of your heart.  Do not allow it to dangle  by your side.  Keep the dressing dry and do not remove it unless your doctor advises you to do so.  He will usually change it at the time of your post-op visit.  Moving your fingers is advised to stimulate circulation but will depend on the site of your surgery.  If you have a splint applied, your doctor will advise you regarding movement.  Activity: Do not drive or operate machinery today.  Rest today and then you may return to your normal activity and work as indicated by your physician.  Diet:  Drink liquids today or eat a light diet.  You may resume a regular diet tomorrow.    General expectations: Pain for two to three days. Fingers may become slightly swollen.  Call your doctor if any of the following occur: Severe pain not relieved by pain medication. Elevated temperature. Dressing soaked with blood. Inability to move fingers. White or bluish color to fingers.[facility]  [address] [place], [place] [address] [phone]   Post Anesthesia Home Care Instructions  Activity: Get plenty of rest for the remainder of the day. A responsible adult should stay with you for 24 hours following the procedure.  For the next 24 hours, DO NOT: -Drive a car -Operate machinery -Drink alcoholic beverages -Take any medication unless instructed by your physician -Make any legal decisions or sign important papers.  Meals: Start with liquid foods such as gelatin or soup. Progress to regular foods as tolerated. Avoid greasy, spicy, heavy foods. If nausea and/or vomiting occur, drink only clear liquids until the nausea and/or vomiting subsides. Call your physician if vomiting continues.  Special Instructions/Symptoms: Your throat may feel dry or sore from the anesthesia or the breathing tube placed in your throat during surgery. If  this causes discomfort, gargle with warm salt water. The discomfort should disappear within 24 hours.   

## 2011-11-24 NOTE — H&P (Signed)
[name] [name] is an 56 y.o. male.   Chief Complaint: left index finger pip infection HPI: 56 yo male 6 months s/p copperhead bite to left index finger.  Required full thickness skin graft to cover wound on finger.  Has drainage from proximal aspect of grafted area and erythema, swelling, and lost rom pip joint.  Past Medical History  Diagnosis Date  . Hypertension   . Hypercholesterolemia   . Complication of anesthesia   . PONV (postoperative nausea and vomiting)     Past Surgical History  Procedure Date  . Hernia repair     as a child  . Finger exploration 8/12    rt index finger snakebite  . Colonoscopy     Family History  Problem Relation Age of Onset  . Diabetes Mother    Social History:  reports that he has been smoking Cigarettes.  He has a 20 pack-year smoking history. He does not have any smokeless tobacco history on file. He reports that he drinks alcohol. He reports that he does not use illicit drugs.  Allergies: No Known Allergies  Medications Prior to Admission  Medication Dose Route Frequency Provider Last Rate Last Dose  . chlorhexidine (HIBICLENS) 4 % liquid 4 application  60 mL Topical Once Tami Ribas, MD      . lactated ringers infusion   Intravenous Continuous Remonia Richter, MD 20 mL/hr at 11/24/11 4540     Medications Prior to Admission  Medication Sig Dispense Refill  . Multiple Vitamins-Minerals (CENTRUM ULTRA MENS) TABS Take 1 tablet by mouth daily.        Marland Kitchen olmesartan-hydrochlorothiazide (BENICAR HCT) 40-25 MG per tablet Take 1 tablet by mouth daily.        . Omega-3 Fatty Acids (FISH OIL) 1000 MG CAPS Take 1 capsule by mouth daily.        . rosuvastatin (CRESTOR) 10 MG tablet Take 10 mg by mouth at bedtime.        Marland Kitchen aspirin 325 MG EC tablet Take 325-650 mg by mouth daily.          Results for orders placed during the hospital encounter of 11/24/11 (from the past 48 hour(s))  POCT I-STAT, CHEM 8     Status: Abnormal   Collection Time   11/24/11  9:32 AM      Component Value Range Comment   Sodium 134 (*) 135 - 145 (mEq/L)    Potassium 4.4  3.5 - 5.1 (mEq/L)    Chloride 106  96 - 112 (mEq/L)    BUN 14  6 - 23 (mg/dL)    Creatinine, Ser 9.81 (*) 0.50 - 1.35 (mg/dL)    Glucose, Bld 191 (*) 70 - 99 (mg/dL)    Calcium, Ion 4.78  1.12 - 1.32 (mmol/L)    TCO2 21  0 - 100 (mmol/L)    Hemoglobin 15.3  13.0 - 17.0 (g/dL)    HCT 29.5  62.1 - 30.8 (%)     No results found.   A comprehensive review of systems was negative.  Blood pressure 97/62, pulse 90, temperature 97.7 F (36.5 C), temperature source Oral, resp. rate 20, height 5\' 8"  (1.727 m), weight 88.451 kg (195 lb), SpO2 96.00%.  General appearance: alert, cooperative and appears stated age Head: Normocephalic, without obvious abnormality, atraumatic Neck: supple, symmetrical, trachea midline Resp: clear to auscultation bilaterally Cardio: regular rate and rhythm GI: soft, non-tender; bowel sounds normal; no masses,  no organomegaly Extremities: light touch sensation and  capillary refill intact all digits except left index.  +epl/fpl/io.  left index with decreased sensation on radial side.  wound on dorsoradial side at level of pip joint.  erythema surrounding wound.  Pulses: 2+ and symmetric Skin: as above Neurologic: Grossly normal Incision/Wound: As above  Assessment/Plan Left index finger pip joint chronic infection s/p copperhead bite and full thickness skin grafting.  Discussed nature of condition with patient including non operative and operative treatment measures.  Operative irrigation and debridement was selected.  Risks, benefits, and alternatives of surgery were discussed and the patient agrees with the plan of care.   Lailoni Baquera R 11/24/2011, 10:36 AM

## 2011-11-25 NOTE — Op Note (Signed)
NAME:  JANSSEN, ZEE NO.:  [id number]  MEDICAL RECORD NO.:  [id number]  LOCATION:                                 FACILITY:  PHYSICIAN:  Betha Loa, MD             DATE OF BIRTH:  DATE OF PROCEDURE:  11/24/2011 DATE OF DISCHARGE:                              OPERATIVE REPORT   PREOPERATIVE DIAGNOSIS:  Right index finger proximal interphalangeal joint infection status post snake bite.  POSTOPERATIVE DIAGNOSIS:  Right index finger proximal interphalangeal joint infection status post snake bite.  PROCEDURE:  Right index finger irrigation and debridement of proximal interphalangeal joint and bony abscess.  SURGEON:  Betha Loa, MD  ASSISTANT:  None.  ANESTHESIA:  General.  IV FLUIDS:  Per anesthesia flow sheet.  ESTIMATED BLOOD LOSS:  Minimal.  COMPLICATIONS:  None.  SPECIMENS:  Cultures to micro for aerobes and anaerobes.  TOURNIQUET TIME:  30 minutes.  DISPOSITION:  Stable to PACU.  INDICATIONS:  Mr. Wyka is a 56 year old male who approximately 6 months ago was bitten on the right index finger by a copperhead snake. This required a full-thickness skin graft to cover an eschar.  He has had a wound at the dorsal radial aspect of the PIP joint.  He states this has intermittently swollen and become reddened but would always go way.  He returned to the office last week with a reddened and swollen PIP joint of the right index finger.  There was purulence draining from the wound.  He was started on antibiotics.  He was doing daily soaks. He returned on Monday.  The erythema decreased some, but there was still swelling and drainage.  It was felt it was appropriate to return to the operating room for irrigation and debridement of the wound and joint. Risks, benefits, and alternatives of surgery were discussed including risk of blood loss, infection, damage to nerves, vessels, tendons, ligaments, bone, failure of procedure, need for additional  procedures, complications, wound healing, continued pain, continued infection, need for repeat irrigation and debridement.  He voiced understanding of these risks and elected to proceed.  OPERATIVE COURSE:  After being identified preoperatively by myself, the patient and I agreed upon the procedure and site of procedure.  The surgery site was marked.  Risks, benefits, and alternatives of surgery were reviewed and he wished to proceed.  Surgical consent had been signed.  He was transferred to operating room, placed on the operating table in supine position with the right upper extremity on arm board. General anesthesia was induced by the anesthesiologist.  The right upper extremity was prepped and draped in normal sterile orthopedic fashion. Surgical pause was performed between surgeons, anesthesia, operating staff, and all were in agreement with the patient, procedure, and site of procedure.  Tourniquet on the proximal aspect of the forearm was inflated to 250 mmHg after exsanguination of the hand and forearm with an Esmarch bandage.  The incision was made on the radial side of the index finger including the chronic draining wound.  It was extended both proximally and distally.  This was carried into subcutaneous tissues by spreading technique.  There was  no gross purulence superficial to the extensor tendon.  There was significant amount patulous and chronically infected tissue at the radial side of the PIP joint.  The collateral ligaments dorsal half was degenerated and infected.  This had to be removed.  There was a small bony abscess at the dorsal aspect of the distal aspect of the proximal phalanx.  This was debrided using a curette back to normal-appearing bone.  There was also a bony abscess at the radial side of the base of the middle phalanx.  Again, this was debrided using the curette.  There was no gross purulence within the joint.  The wound and abscess and the joint was  copiously irrigated with 300 mL of sterile saline by Angiocath needle.  The wound and joints were then packed with Iodoform gauze.  This was dressed with sterile Xeroform.  A 3 mL of 0.25% plain Marcaine were injected around the wound edges to aid in postoperative analgesia.  The wound was dressed with sterile Xeroform and 4x4s and wrapped with a Coban.  An Alumafoam splint was placed and wrapped with Coban as well lightly.  The tourniquet was deflated at 30 minutes.  The operative drapes were broken down, and the patient was awoken from anesthesia safely.  He was transferred back to the stretcher and taken to PACU in stable condition.  I will see him back in the office on Monday for initiation of hydrotherapy.  We will continue him on Bactrim DS 1 p.o. b.i.d. x10 days.  I will give him Percocet 5/325 one to two p.o. q.6 hours p.r.n. pain, dispensed #40.     Betha Loa, MD     KK/MEDQ  D:  11/24/2011  T:  11/25/2011  Job:  [id number]

## 2011-11-27 LAB — TISSUE CULTURE: Gram Stain: NONE SEEN

## 2011-11-28 ENCOUNTER — Encounter (HOSPITAL_BASED_OUTPATIENT_CLINIC_OR_DEPARTMENT_OTHER): Payer: Self-pay | Admitting: Orthopedic Surgery

## 2011-11-29 LAB — ANAEROBIC CULTURE: Gram Stain: NONE SEEN

## 2011-12-06 ENCOUNTER — Other Ambulatory Visit (HOSPITAL_COMMUNITY): Payer: Self-pay | Admitting: Nephrology

## 2011-12-06 DIAGNOSIS — N289 Disorder of kidney and ureter, unspecified: Secondary | ICD-10-CM

## 2011-12-07 ENCOUNTER — Ambulatory Visit: Payer: 59 | Admitting: Infectious Diseases

## 2011-12-09 ENCOUNTER — Ambulatory Visit (HOSPITAL_COMMUNITY)
Admission: RE | Admit: 2011-12-09 | Discharge: 2011-12-09 | Disposition: A | Discharge status: Home / Self Care | Payer: 59 | Source: Ambulatory Visit | Attending: Nephrology | Admitting: Nephrology

## 2011-12-09 DIAGNOSIS — N289 Disorder of kidney and ureter, unspecified: Secondary | ICD-10-CM | POA: Insufficient documentation

## 2011-12-12 ENCOUNTER — Encounter: Payer: Self-pay | Admitting: Infectious Diseases

## 2011-12-12 ENCOUNTER — Ambulatory Visit (INDEPENDENT_AMBULATORY_CARE_PROVIDER_SITE_OTHER): Payer: 59 | Admitting: Infectious Diseases

## 2011-12-12 VITALS — BP 99/64 | HR 88 | Temp 97.7°F | Ht 68.0 in | Wt 211.0 lb

## 2011-12-12 DIAGNOSIS — L02519 Cutaneous abscess of unspecified hand: Secondary | ICD-10-CM

## 2011-12-12 DIAGNOSIS — F17200 Nicotine dependence, unspecified, uncomplicated: Secondary | ICD-10-CM

## 2011-12-12 DIAGNOSIS — Z720 Tobacco use: Secondary | ICD-10-CM | POA: Insufficient documentation

## 2011-12-12 MED ORDER — CEFAZOLIN SODIUM 1 G IV SOLR: 1.0000 g | Freq: Three times a day (TID) | INTRAVENOUS | Status: DC

## 2011-12-12 NOTE — Progress Notes (Signed)
  Subjective:    Patient ID: [name] [name], male    DOB: 07-23-1956, 56 y.o.   MRN: [id number]  HPI 56 yo M with a history of a snake bite to his left index finger in August of 2012. He had some loss of skin after this. He required an I&D of the eschar, as well as split her full thickness skin grafting on roughly 06/08/2011. He was treated with Bactrim  December of 2012. After this treatment, he noted that the swelling and erythema that he had his wound increased significant decreased significantly. In the beginning of February 2013 he noted more redness and swelling as well as drainage from his wound. He was restarted on antibiotics at that time 11/16/2011 is followup on 11/21/2011 he was found to have wound drainage as well as erythema. He had plain films done that showed decreased joint space at the PIP but no bony abscess. He returned to the OR for I&D on 11/27/2011. His culture was positive for MSSA (resistant to penicillin). He is getting hydrotherapy and having the wound packed there. No fever or chills. Continues to have wound d/c- yellow, brown. Has swelling and redness (better than previous), also has soreness.    Review of Systems  Constitutional: Negative for fever, chills, appetite change and unexpected weight change.  Gastrointestinal: Negative for diarrhea and constipation.  Genitourinary: Negative for dysuria.  denies lymphadenopathy, no thrush.     Objective:   Physical Exam  Constitutional: He appears well-developed and well-nourished.  HENT:  Mouth/Throat: No oropharyngeal exudate.  Eyes: EOM are normal. Pupils are equal, round, and reactive to light.  Neck: Neck supple.  Cardiovascular: Normal rate, regular rhythm and normal heart sounds.   Pulmonary/Chest: Effort normal and breath sounds normal. No respiratory distress.  Abdominal: Soft. Bowel sounds are normal. He exhibits no distension. There is no tenderness.  Musculoskeletal:       Arms: Lymphadenopathy:    He  has no cervical adenopathy.          Assessment & Plan:

## 2011-12-12 NOTE — Assessment & Plan Note (Signed)
Encourage pt to quit 

## 2011-12-12 NOTE — Assessment & Plan Note (Signed)
His Cx appears to be a true surgical Cx (not a superficial wound). Doubt that this wound needs anaerobic coverage given that it has had multiple debridements. Discussed options with him, will set him up for a PIC line and for IV ancef for 4-6 weeks depending on the progress of his wounds. Will see him back in 2 weeks

## 2011-12-13 ENCOUNTER — Other Ambulatory Visit: Payer: Self-pay | Admitting: Infectious Diseases

## 2011-12-13 DIAGNOSIS — L02519 Cutaneous abscess of unspecified hand: Secondary | ICD-10-CM

## 2011-12-13 DIAGNOSIS — L03119 Cellulitis of unspecified part of limb: Secondary | ICD-10-CM

## 2011-12-13 MED ORDER — CEFAZOLIN SODIUM 1 G IV SOLR: 1.0000 g | Freq: Three times a day (TID) | INTRAVENOUS | Status: AC

## 2011-12-14 ENCOUNTER — Ambulatory Visit (HOSPITAL_COMMUNITY)
Admission: RE | Admit: 2011-12-14 | Discharge: 2011-12-14 | Disposition: A | Discharge status: Home / Self Care | Payer: 59 | Source: Ambulatory Visit | Attending: Infectious Diseases | Admitting: Infectious Diseases

## 2011-12-14 ENCOUNTER — Other Ambulatory Visit: Payer: Self-pay | Admitting: Infectious Diseases

## 2011-12-14 DIAGNOSIS — L0889 Other specified local infections of the skin and subcutaneous tissue: Secondary | ICD-10-CM | POA: Insufficient documentation

## 2011-12-14 DIAGNOSIS — L02519 Cutaneous abscess of unspecified hand: Secondary | ICD-10-CM

## 2011-12-14 DIAGNOSIS — L03119 Cellulitis of unspecified part of limb: Secondary | ICD-10-CM

## 2011-12-14 NOTE — Procedures (Signed)
Successful placement of right brachial vein approach 38 cm single lumen PICC line with tip at the superior caval-atrial junction.  The PICC line is ready for immediate use. 

## 2011-12-16 ENCOUNTER — Telehealth (HOSPITAL_COMMUNITY): Payer: Self-pay

## 2011-12-28 ENCOUNTER — Ambulatory Visit (INDEPENDENT_AMBULATORY_CARE_PROVIDER_SITE_OTHER): Payer: 59 | Admitting: Infectious Diseases

## 2011-12-28 ENCOUNTER — Encounter: Payer: Self-pay | Admitting: Infectious Diseases

## 2011-12-28 VITALS — BP 109/74 | HR 91 | Temp 97.7°F | Ht 68.0 in | Wt 193.8 lb

## 2011-12-28 DIAGNOSIS — IMO0001 Reserved for inherently not codable concepts without codable children: Secondary | ICD-10-CM

## 2011-12-28 DIAGNOSIS — L02519 Cutaneous abscess of unspecified hand: Secondary | ICD-10-CM

## 2011-12-28 NOTE — Progress Notes (Signed)
  Subjective:    Patient ID: [name] [name], male    DOB: 1956-03-28, 56 y.o.   MRN: [id number]  HPI 56 yo M with a history of a snake bite to his left index finger in August of 2012. He had some loss of skin after this. He required an I&D of the eschar, as well as split her full thickness skin grafting on roughly 06/08/2011. He was treated with Bactrim December of 2012. After this treatment, he noted that the swelling and erythema that he had his wound increased significant decreased significantly. In the beginning of February 2013 he noted more redness and swelling as well as drainage from his wound. He was restarted on antibiotics at that time 11/16/2011 is followup on 11/21/2011 he was found to have wound drainage as well as erythema. He had plain films done that showed decreased joint space at the PIP but no bony abscess. He returned to the OR for I&D on 11/27/2011. His culture was positive for MSSA (resistant to penicillin).  He is getting hydrotherapy and having the wound packed there. Started on IV ancef at last ID visit.  Has been doing well. Feels like his finger got somewhat more red since his Friday hydrotherapy. He had some d/c at that time. He has had no problems with his PIC, worried about air bubbles during injection.     Review of Systems     Objective:   Physical Exam  Constitutional: He appears well-developed and well-nourished.  Musculoskeletal:       Arms:         Assessment & Plan:

## 2011-12-28 NOTE — Assessment & Plan Note (Signed)
He appears to be doing ok. Will continue his anbx for an additional month from today. See him back at that time. Some concern about the long term function on his finger, it has atrophied significantly.

## 2012-01-09 ENCOUNTER — Encounter: Payer: Self-pay | Admitting: Infectious Diseases

## 2012-01-13 ENCOUNTER — Encounter: Payer: Self-pay | Admitting: Infectious Diseases

## 2012-01-16 ENCOUNTER — Other Ambulatory Visit: Payer: Self-pay | Admitting: Orthopedic Surgery

## 2012-01-18 ENCOUNTER — Encounter: Payer: Self-pay | Admitting: Infectious Diseases

## 2012-01-19 ENCOUNTER — Encounter (HOSPITAL_BASED_OUTPATIENT_CLINIC_OR_DEPARTMENT_OTHER): Payer: Self-pay | Admitting: *Deleted

## 2012-01-19 NOTE — Progress Notes (Signed)
Needs istat dos-here 2/13,8,12 Snake bite to finger Has picc line rt arm-getting home iv antibiotics

## 2012-01-23 ENCOUNTER — Encounter (HOSPITAL_BASED_OUTPATIENT_CLINIC_OR_DEPARTMENT_OTHER): Payer: Self-pay | Admitting: Certified Registered"

## 2012-01-23 ENCOUNTER — Encounter (HOSPITAL_BASED_OUTPATIENT_CLINIC_OR_DEPARTMENT_OTHER): Payer: Self-pay

## 2012-01-23 ENCOUNTER — Ambulatory Visit (HOSPITAL_BASED_OUTPATIENT_CLINIC_OR_DEPARTMENT_OTHER): Payer: 59 | Admitting: Certified Registered"

## 2012-01-23 ENCOUNTER — Encounter (HOSPITAL_BASED_OUTPATIENT_CLINIC_OR_DEPARTMENT_OTHER): Payer: Self-pay | Admitting: Orthopedic Surgery

## 2012-01-23 ENCOUNTER — Encounter (HOSPITAL_BASED_OUTPATIENT_CLINIC_OR_DEPARTMENT_OTHER)
Admission: RE | Disposition: A | Discharge status: Home / Self Care | Payer: Self-pay | Source: Ambulatory Visit | Attending: Orthopedic Surgery

## 2012-01-23 ENCOUNTER — Ambulatory Visit (HOSPITAL_BASED_OUTPATIENT_CLINIC_OR_DEPARTMENT_OTHER)
Admission: RE | Admit: 2012-01-23 | Discharge: 2012-01-23 | Disposition: A | Discharge status: Home / Self Care | Payer: 59 | Source: Ambulatory Visit | Attending: Orthopedic Surgery | Admitting: Orthopedic Surgery

## 2012-01-23 DIAGNOSIS — T63001A Toxic effect of unspecified snake venom, accidental (unintentional), initial encounter: Secondary | ICD-10-CM | POA: Insufficient documentation

## 2012-01-23 DIAGNOSIS — S61209A Unspecified open wound of unspecified finger without damage to nail, initial encounter: Secondary | ICD-10-CM | POA: Insufficient documentation

## 2012-01-23 DIAGNOSIS — T6591XS Toxic effect of unspecified substance, accidental (unintentional), sequela: Secondary | ICD-10-CM | POA: Insufficient documentation

## 2012-01-23 HISTORY — PX: MASS EXCISION: SHX2000

## 2012-01-23 HISTORY — DX: Other specified personal risk factors, not elsewhere classified: Z91.89

## 2012-01-23 LAB — POCT I-STAT, CHEM 8
BUN: 12 mg/dL (ref 6–23)
Calcium, Ion: 1.09 mmol/L — ABNORMAL LOW (ref 1.12–1.32)
Creatinine, Ser: 1 mg/dL (ref 0.50–1.35)
Glucose, Bld: 100 mg/dL — ABNORMAL HIGH (ref 70–99)
TCO2: 23 mmol/L (ref 0–100)

## 2012-01-23 SURGERY — EXCISION MASS
Anesthesia: General | Site: Finger | Laterality: Right | Wound class: Contaminated

## 2012-01-23 MED ORDER — FENTANYL CITRATE 0.05 MG/ML IJ SOLN
INTRAMUSCULAR | Status: DC | PRN
  Administered 2012-01-23: 100 ug via INTRAVENOUS

## 2012-01-23 MED ORDER — PROPOFOL 10 MG/ML IV EMUL
INTRAVENOUS | Status: DC | PRN
  Administered 2012-01-23: 250 mg via INTRAVENOUS

## 2012-01-23 MED ORDER — OXYCODONE-ACETAMINOPHEN 5-325 MG PO TABS
1.0000 | ORAL_TABLET | Freq: Once | ORAL | Status: AC
  Administered 2012-01-23: 1 via ORAL

## 2012-01-23 MED ORDER — LIDOCAINE HCL (CARDIAC) 20 MG/ML IV SOLN
INTRAVENOUS | Status: DC | PRN
  Administered 2012-01-23: 100 mg via INTRAVENOUS

## 2012-01-23 MED ORDER — ONDANSETRON HCL 4 MG/2ML IJ SOLN
INTRAMUSCULAR | Status: DC | PRN
  Administered 2012-01-23: 4 mg via INTRAVENOUS

## 2012-01-23 MED ORDER — METOCLOPRAMIDE HCL 5 MG/ML IJ SOLN: 10.0000 mg | Freq: Once | INTRAMUSCULAR | Status: DC | PRN

## 2012-01-23 MED ORDER — HYDROMORPHONE HCL PF 1 MG/ML IJ SOLN: 0.2500 mg | INTRAMUSCULAR | Status: DC | PRN

## 2012-01-23 MED ORDER — DROPERIDOL 2.5 MG/ML IJ SOLN
INTRAMUSCULAR | Status: DC | PRN
  Administered 2012-01-23: 0.625 mg via INTRAVENOUS

## 2012-01-23 MED ORDER — OXYCODONE-ACETAMINOPHEN 5-325 MG PO TABS: ORAL_TABLET | ORAL | Status: AC

## 2012-01-23 MED ORDER — CHLORHEXIDINE GLUCONATE 4 % EX LIQD: 60.0000 mL | Freq: Once | CUTANEOUS | Status: DC

## 2012-01-23 MED ORDER — EPHEDRINE SULFATE 50 MG/ML IJ SOLN
INTRAMUSCULAR | Status: DC | PRN
  Administered 2012-01-23: 5 mg via INTRAVENOUS
  Administered 2012-01-23: 10 mg via INTRAVENOUS
  Administered 2012-01-23 (×2): 5 mg via INTRAVENOUS

## 2012-01-23 MED ORDER — MIDAZOLAM HCL 5 MG/5ML IJ SOLN
INTRAMUSCULAR | Status: DC | PRN
  Administered 2012-01-23: 2 mg via INTRAVENOUS

## 2012-01-23 MED ORDER — 0.9 % SODIUM CHLORIDE (POUR BTL) OPTIME
TOPICAL | Status: DC | PRN
  Administered 2012-01-23: 1000 mL

## 2012-01-23 MED ORDER — DEXAMETHASONE SODIUM PHOSPHATE 4 MG/ML IJ SOLN
INTRAMUSCULAR | Status: DC | PRN
  Administered 2012-01-23: 4 mg via INTRAVENOUS

## 2012-01-23 MED ORDER — LACTATED RINGERS IV SOLN
INTRAVENOUS | Status: DC
  Administered 2012-01-23 (×2): via INTRAVENOUS

## 2012-01-23 MED ORDER — VANCOMYCIN HCL IN DEXTROSE 1-5 GM/200ML-% IV SOLN
1000.0000 mg | INTRAVENOUS | Status: AC
  Administered 2012-01-23: 1000 mg via INTRAVENOUS

## 2012-01-23 SURGICAL SUPPLY — 57 items
APL SKNCLS STERI-STRIP NONHPOA (GAUZE/BANDAGES/DRESSINGS)
BANDAGE COBAN STERILE 2 (GAUZE/BANDAGES/DRESSINGS) IMPLANT
BANDAGE CONFORM 2  STR LF (GAUZE/BANDAGES/DRESSINGS) ×1 IMPLANT
BANDAGE ELASTIC 3 VELCRO ST LF (GAUZE/BANDAGES/DRESSINGS) IMPLANT
BANDAGE GAUZE ELAST BULKY 4 IN (GAUZE/BANDAGES/DRESSINGS) IMPLANT
BANDAGE GAUZE STRT 1 STR LF (GAUZE/BANDAGES/DRESSINGS) IMPLANT
BENZOIN TINCTURE PRP APPL 2/3 (GAUZE/BANDAGES/DRESSINGS) IMPLANT
BLADE MINI RND TIP GREEN BEAV (BLADE) IMPLANT
BLADE SURG 15 STRL LF DISP TIS (BLADE) ×2 IMPLANT
BLADE SURG 15 STRL SS (BLADE) ×4
BNDG CMPR 9X4 STRL LF SNTH (GAUZE/BANDAGES/DRESSINGS) ×1
BNDG CMPR MD 5X2 ELC HKLP STRL (GAUZE/BANDAGES/DRESSINGS)
BNDG COHESIVE 1X5 TAN STRL LF (GAUZE/BANDAGES/DRESSINGS) ×1 IMPLANT
BNDG ELASTIC 2 VLCR STRL LF (GAUZE/BANDAGES/DRESSINGS) IMPLANT
BNDG ESMARK 4X9 LF (GAUZE/BANDAGES/DRESSINGS) ×1 IMPLANT
BNDG PLASTER X FAST 3X3 WHT LF (CAST SUPPLIES) IMPLANT
BNDG PLSTR 9X3 FST ST WHT (CAST SUPPLIES)
CHLORAPREP W/TINT 26ML (MISCELLANEOUS) ×2 IMPLANT
CLOTH BEACON ORANGE TIMEOUT ST (SAFETY) ×2 IMPLANT
CORDS BIPOLAR (ELECTRODE) ×2 IMPLANT
COVER MAYO STAND STRL (DRAPES) ×2 IMPLANT
COVER TABLE BACK 60X90 (DRAPES) ×2 IMPLANT
CUFF TOURNIQUET SINGLE 18IN (TOURNIQUET CUFF) ×2 IMPLANT
DRAPE EXTREMITY T 121X128X90 (DRAPE) ×2 IMPLANT
DRAPE SURG 17X23 STRL (DRAPES) ×2 IMPLANT
DRSG EMULSION OIL 3X3 NADH (GAUZE/BANDAGES/DRESSINGS) ×1 IMPLANT
GAUZE XEROFORM 1X8 LF (GAUZE/BANDAGES/DRESSINGS) ×1 IMPLANT
GLOVE BIO SURGEON STRL SZ 6.5 (GLOVE) ×1 IMPLANT
GLOVE BIO SURGEON STRL SZ7.5 (GLOVE) ×2 IMPLANT
GLOVE ECLIPSE 6.5 STRL STRAW (GLOVE) ×1 IMPLANT
GOWN PREVENTION PLUS XLARGE (GOWN DISPOSABLE) ×2 IMPLANT
GOWN STRL REIN XL XLG (GOWN DISPOSABLE) ×2 IMPLANT
MATRIX SURGICAL PSM 5X5CM (Tissue) ×1 IMPLANT
MICROMATRIX 200MG (Tissue) ×1 IMPLANT
NDL HYPO 25X1 1.5 SAFETY (NEEDLE) ×1 IMPLANT
NEEDLE HYPO 25X1 1.5 SAFETY (NEEDLE) IMPLANT
NS IRRIG 1000ML POUR BTL (IV SOLUTION) ×2 IMPLANT
PACK BASIN DAY SURGERY FS (CUSTOM PROCEDURE TRAY) ×2 IMPLANT
PAD CAST 3X4 CTTN HI CHSV (CAST SUPPLIES) IMPLANT
PAD CAST 4YDX4 CTTN HI CHSV (CAST SUPPLIES) IMPLANT
PADDING CAST ABS 4INX4YD NS (CAST SUPPLIES)
PADDING CAST ABS COTTON 4X4 ST (CAST SUPPLIES) ×1 IMPLANT
PADDING CAST COTTON 3X4 STRL (CAST SUPPLIES)
PADDING CAST COTTON 4X4 STRL (CAST SUPPLIES)
SPLINT FNGR PLAIN END 5/8X3.25 (CAST SUPPLIES) IMPLANT
SPLINT PLASTALUME 3 1/4 (CAST SUPPLIES) ×2
SPONGE GAUZE 4X4 12PLY (GAUZE/BANDAGES/DRESSINGS) ×1 IMPLANT
STOCKINETTE 4X48 STRL (DRAPES) ×2 IMPLANT
STRIP CLOSURE SKIN 1/2X4 (GAUZE/BANDAGES/DRESSINGS) IMPLANT
SUT ETHILON 3 0 PS 1 (SUTURE) IMPLANT
SUT ETHILON 4 0 PS 2 18 (SUTURE) ×2 IMPLANT
SUT MON AB 5-0 P3 18 (SUTURE) ×1 IMPLANT
SYR BULB 3OZ (MISCELLANEOUS) ×2 IMPLANT
SYR CONTROL 10ML LL (SYRINGE) ×1 IMPLANT
TOWEL OR 17X24 6PK STRL BLUE (TOWEL DISPOSABLE) ×3 IMPLANT
UNDERPAD 30X30 INCONTINENT (UNDERPADS AND DIAPERS) ×2 IMPLANT
WATER STERILE IRR 1000ML POUR (IV SOLUTION) ×1 IMPLANT

## 2012-01-23 NOTE — Transfer of Care (Signed)
Immediate Anesthesia Transfer of Care Note  Patient: [name] [name]  Procedure(s) Performed: Procedure(s) (LRB): EXCISION MASS (Right)  Patient Location: PACU  Anesthesia Type: General  Level of Consciousness: awake, alert , oriented and patient cooperative  Airway & Oxygen Therapy: Patient Spontanous Breathing and Patient connected to face mask oxygen  Post-op Assessment: Report given to PACU RN and Post -op Vital signs reviewed and stable  Post vital signs: Reviewed and stable  Complications: No apparent anesthesia complications

## 2012-01-23 NOTE — Discharge Instructions (Addendum)

## 2012-01-23 NOTE — Brief Op Note (Signed)
01/23/2012  9:48 AM  PATIENT:  [name] [name]  56 y.o. male  PRE-OPERATIVE DIAGNOSIS:  Right index non-healing wound  POST-OPERATIVE DIAGNOSIS:  Right index non-healing wound  PROCEDURE:  Procedure(s) (LRB): EXCISION MASS (Right)  SURGEON:  Surgeon(s) and Role:    * Tami Ribas, MD - Primary  PHYSICIAN ASSISTANT:   ASSISTANTS: none   ANESTHESIA:   general  EBL:  Total I/O In: 600 [I.V.:600] Out: -   BLOOD ADMINISTERED:none  DRAINS: none   LOCAL MEDICATIONS USED:  NONE  SPECIMEN:  No Specimen  DISPOSITION OF SPECIMEN:  N/A  COUNTS:  YES  TOURNIQUET:   Total Tourniquet Time Documented: Forearm (Right) - 29 minutes  DICTATION: .Other Dictation: Dictation Number [id number]  PLAN OF CARE: Discharge to home after PACU  PATIENT DISPOSITION:  PACU - hemodynamically stable.

## 2012-01-23 NOTE — Anesthesia Postprocedure Evaluation (Signed)
Anesthesia Post Note  Patient: [name] [name]  Procedure(s) Performed: Procedure(s) (LRB): EXCISION MASS (Right)  Anesthesia type: General  Patient location: PACU  Post pain: Pain level controlled  Post assessment: Patient's Cardiovascular Status Stable  Last Vitals:  Filed Vitals:   01/23/12 1030  BP: 112/77  Pulse: 114  Temp:   Resp: 19    Post vital signs: Reviewed and stable  Level of consciousness: alert  Complications: No apparent anesthesia complications

## 2012-01-23 NOTE — Anesthesia Procedure Notes (Signed)
Procedure Name: LMA Insertion Date/Time: 01/23/2012 9:04 AM Performed by: Verlan Friends Pre-anesthesia Checklist: Patient identified, Emergency Drugs available, Suction available, Patient being monitored and Timeout performed Patient Re-evaluated:Patient Re-evaluated prior to inductionOxygen Delivery Method: Circle System Utilized Preoxygenation: Pre-oxygenation with 100% oxygen Intubation Type: IV induction Ventilation: Mask ventilation without difficulty LMA: LMA inserted LMA Size: 4.0 Number of attempts: 1 (atraumatic) Airway Equipment and Method: bite block (left posterior bite gard used) Placement Confirmation: positive ETCO2 Tube secured with: Tape (pink tape used) Dental Injury: Teeth and Oropharynx as per pre-operative assessment

## 2012-01-23 NOTE — Op Note (Signed)
NAME:  ZAKEE, DEERMAN NO.:  [id number]  MEDICAL RECORD NO.:  [id number]  LOCATION:                                 FACILITY:  PHYSICIAN:  Betha Loa, MD        DATE OF BIRTH:  05/30/1956  DATE OF PROCEDURE:  01/23/2012 DATE OF DISCHARGE:                              OPERATIVE REPORT   PREOPERATIVE DIAGNOSIS:  Right index finger, nonhealing wound, status post snake bite.  POSTOP DIAGNOSIS:  Right index finger, nonhealing wound, status post snake bite.  PROCEDURE:  Right index finger irrigation and debridement and A-Cell graft placement.  SURGEON:  Betha Loa, MD  ASSISTANTS:  None.  ANESTHESIA:  General.  IV FLUIDS:  Per anesthesia flow sheet.  ESTIMATED BLOOD LOSS:  Minimal.  COMPLICATIONS:  None.  SPECIMENS:  None.  TOURNIQUET TIME:  29 minutes.  DISPOSITION:  Stable to PACU.  INDICATIONS:  Mr. Diaz a 56 year old male, who was bitten by a snake in August 2012.  He has undergone a full-thickness skin graft for skin loss followed by an irrigation and debridement for osteomyelitis.  He has returned to the operating room today for nonhealing wound.  Risks, benefits, alternatives of the surgery were discussed including risk of blood loss, infection, damage to nerves, vessels, tendons, ligaments, bone, failure of surgery, need for additional surgery, complications, wound healing, continued pain, continued wound.  He voiced understanding of these risks and elected to proceed.  OPERATIVE COURSE:  After being identified preoperatively by myself, the patient and I agreed upon procedure and site of procedure.  Surgical site was marked.  The risks, benefits, and alternatives of the surgery were reviewed and wished to proceed.  Surgical consent had been signed. He is on IV medications already by PICC line.  He was transferred to the operating room and placed on the operating table in supine position with the right upper extremity on arm board.   General anesthesia was induced by the anesthesiologist.  The right upper extremity was prepped and draped in the normal sterile orthopedic fashion.  Surgical pause was performed between surgeons, anesthesia, and operating staff, and all were in agreement with the patient's procedure and site of procedure. Tourniquet at the proximal aspect of the forearm was inflated to 220 mmHg after exsanguination limb with an Esmarch bandage.  The wound was debrided sharply using the knife.  This was done on the skin and subcutaneous tissues.  Rongeur was used as well.  Skin edges were all freshened.  The joint was exposed.  The wound edges were slightly mobilized.  The wound was copiously irrigated with 500 mL of sterile saline by bulb syringe. There was no gross contamination.  The A-Cell powdered matrix was placed on the wound.  The sheath graft was then fenestrated and placed over the top of the wound and secured at the corners with a 5-0 Monocryl suture. It was then dressed with Adaptic Hydrating gel, moistened 4 x 4, dry 4 x 4, and wrapped with a Kling bandage.  An AlumaFoam splint was placed on the volar aspect of the finger and lightly wrapped with Kling and Coban dressing.  The tourniquet was deflated  at 29 minutes.  The fingertips were pink with brisk capillary refill after deflation of tourniquet. Operative drapes were broken down, and the patient was awoken from anesthesia safely.  He was transferred back to stretcher and taken to PACU in stable condition.  I will see him back in the office in 1 week for postoperative followup.  I will give him Percocet 5/325 one to two p.o. q.6 h. p.r.n. pain, dispensed #40.     Betha Loa, MD     KK/MEDQ  D:  01/23/2012  T:  01/23/2012  Job:  [id number]

## 2012-01-23 NOTE — Op Note (Signed)
Dictation [id number]

## 2012-01-23 NOTE — Anesthesia Preprocedure Evaluation (Signed)
Anesthesia Evaluation  Patient identified by MRN, date of birth, ID band Patient awake    Reviewed: Allergy & Precautions, H&P , NPO status , Patient's Chart, lab work & pertinent test results, reviewed documented beta blocker date and time   History of Anesthesia Complications (+) PONV  Airway Mallampati: II TM Distance: >3 FB Neck ROM: full    Dental   Pulmonary Current Smoker,          Cardiovascular hypertension, On Medications     Neuro/Psych negative neurological ROS  negative psych ROS   GI/Hepatic negative GI ROS, Neg liver ROS,   Endo/Other  negative endocrine ROS  Renal/GU negative Renal ROS  negative genitourinary   Musculoskeletal   Abdominal   Peds  Hematology negative hematology ROS (+)   Anesthesia Other Findings See surgeon's H&P   Reproductive/Obstetrics negative OB ROS                           Anesthesia Physical Anesthesia Plan  ASA: II  Anesthesia Plan: General   Post-op Pain Management:    Induction: Intravenous  Airway Management Planned: LMA  Additional Equipment:   Intra-op Plan:   Post-operative Plan: Extubation in OR  Informed Consent: I have reviewed the patients History and Physical, chart, labs and discussed the procedure including the risks, benefits and alternatives for the proposed anesthesia with the patient or authorized representative who has indicated his/her understanding and acceptance.     Plan Discussed with: CRNA and Surgeon  Anesthesia Plan Comments:         Anesthesia Quick Evaluation

## 2012-01-23 NOTE — H&P (Addendum)
[name] [name] is an 56 y.o. male.   Chief Complaint: right index non healing wound HPI: 56 yo male with non healing wound right index finger after snake bite.  Bite in August 2012.  Has had [facility] followed by I&D for osteomyelitis.  Now with non healing wound with exposed bone at base.    Past Medical History  Diagnosis Date  . Hypertension   . Hypercholesterolemia   . Tobacco abuse   . Complication of anesthesia   . PONV (postoperative nausea and vomiting)   . History of venomous snake bite     rt index finger-    Past Surgical History  Procedure Date  . Hernia repair     as a child  . Finger exploration 8/12    rt index finger snakebite  . Colonoscopy   . I&d extremity 11/24/2011    Procedure: IRRIGATION AND DEBRIDEMENT EXTREMITY;  Surgeon: [name] [name];  Location: [facility];  Service: Orthopedics;  Laterality: Right;  right index    Family History  Problem Relation Age of Onset  . Diabetes Mother    Social History:  reports that he has been smoking Cigarettes.  He has a 20 pack-year smoking history. He has never used smokeless tobacco. He reports that he drinks alcohol. He reports that he does not use illicit drugs.  Allergies:  Allergies  Allergen Reactions  . Augmentin Nausea And Vomiting    Medications Prior to Admission  Medication Dose Route Frequency Provider Last Rate Last Dose  . chlorhexidine (HIBICLENS) 4 % liquid 4 application  60 mL Topical Once [name] [name]      . lactated ringers infusion   Intravenous Continuous [name] [name] 20 mL/hr at 01/23/12 [phone]    . vancomycin (VANCOCIN) IVPB 1000 mg/200 mL premix  1,000 mg Intravenous 60 min Pre-Op [name] [name]   1,000 mg at 01/23/12 5621   Medications Prior to Admission  Medication Sig Dispense Refill  . aspirin 325 MG EC tablet Take 325-650 mg by mouth daily.        Marland Kitchen ceFAZolin (ANCEF) 1 G injection Inject 1,000 mg (1 g total) into the vein every 8 (eight) hours.  Inject 1g IVPB q8h  1 each  3  . HYDROcodone-acetaminophen (NORCO) 10-325 MG per tablet Take 1 tablet by mouth every 6 (six) hours as needed.      . Multiple Vitamins-Minerals (CENTRUM ULTRA MENS) TABS Take 1 tablet by mouth daily.        Marland Kitchen olmesartan-hydrochlorothiazide (BENICAR HCT) 40-25 MG per tablet Take 1 tablet by mouth daily.        . Omega-3 Fatty Acids (FISH OIL) 1000 MG CAPS Take 1 capsule by mouth daily.        Marland Kitchen oxyCODONE-acetaminophen (PERCOCET) 5-325 MG per tablet Take 1 tablet by mouth every 4 (four) hours as needed.      . rosuvastatin (CRESTOR) 10 MG tablet Take 10 mg by mouth at bedtime.        . sulfamethoxazole-trimethoprim (BACTRIM DS) 800-160 MG per tablet Take 1 tablet by mouth 2 (two) times daily.  14 tablet  0    Results for orders placed during the hospital encounter of 01/23/12 (from the past 48 hour(s))  POCT I-STAT, CHEM 8     Status: Abnormal   Collection Time   01/23/12  8:36 AM      Component Value Range Comment   Sodium 140  135 - 145 (  mEq/L)    Potassium 3.8  3.5 - 5.1 (mEq/L)    Chloride 106  96 - 112 (mEq/L)    BUN 12  6 - 23 (mg/dL)    Creatinine, Ser 7.82  0.50 - 1.35 (mg/dL)    Glucose, Bld 956 (*) 70 - 99 (mg/dL)    Calcium, Ion 2.13 (*) 1.12 - 1.32 (mmol/L)    TCO2 23  0 - 100 (mmol/L)    Hemoglobin 15.0  13.0 - 17.0 (g/dL)    HCT 08.6  57.8 - 46.9 (%)     No results found.   A comprehensive review of systems was negative.  Blood pressure 118/78, pulse 83, temperature 97.9 F (36.6 C), temperature source Oral, resp. rate 20, height 5\' 8"  (1.727 m), weight 85.276 kg (188 lb), SpO2 97.00%.  General appearance: alert, cooperative and appears stated age Head: Normocephalic, without obvious abnormality, atraumatic Neck: supple, symmetrical, trachea midline Resp: clear to auscultation bilaterally Cardio: regular rate and rhythm GI: soft, non-tender; bowel sounds normal; no masses,  no organomegaly Extremities: light touch sensation and  capillary refill intact all digits with exception of right index.  +epl/fpl/io.  right index with decreased sensation on radial side.  intact capillary refill.  wound on radial side of pip joint. Pulses: 2+ and symmetric Skin: as above Neurologic: Grossly normal Incision/Wound: As above  Assessment/Plan Right index finger non healing wound s/p snake bite.  Discussed options with patient.  Decided on I&D of finger with possible ACell graft placement.  Risks, benefits, and alternatives of surgery were discussed and the patient agrees with the plan of care.   [name] [name] 01/23/2012, 8:49 AM

## 2012-01-24 ENCOUNTER — Encounter (HOSPITAL_BASED_OUTPATIENT_CLINIC_OR_DEPARTMENT_OTHER): Payer: Self-pay | Admitting: Orthopedic Surgery

## 2012-01-25 ENCOUNTER — Encounter: Payer: Self-pay | Admitting: Infectious Diseases

## 2012-01-27 ENCOUNTER — Telehealth: Payer: Self-pay | Admitting: *Deleted

## 2012-01-27 NOTE — Telephone Encounter (Signed)
Call from patient regarding his end date for IV antibiotics.  He stated that Dr. Merlyn Lot advised him to call the office to see if he needed any more antibiotics because he had another surgery.  I called [facility] and spoke with Melissa and she said she had a fax from this office giving the stop date of 01/23/12. This originated from his first office visit of 12/12/11.  Advised patient to continue to flush PICC line through the weekend and Dr. Ninetta Lights will see in clinic on Monday 01/30/12. Wendall Mola CMA

## 2012-01-30 ENCOUNTER — Ambulatory Visit (INDEPENDENT_AMBULATORY_CARE_PROVIDER_SITE_OTHER): Payer: 59 | Admitting: Infectious Diseases

## 2012-01-30 ENCOUNTER — Encounter: Payer: Self-pay | Admitting: Infectious Diseases

## 2012-01-30 DIAGNOSIS — IMO0001 Reserved for inherently not codable concepts without codable children: Secondary | ICD-10-CM

## 2012-01-30 DIAGNOSIS — L02519 Cutaneous abscess of unspecified hand: Secondary | ICD-10-CM

## 2012-01-30 DIAGNOSIS — M7989 Other specified soft tissue disorders: Secondary | ICD-10-CM

## 2012-01-30 LAB — CBC
MCV: 98.8 fL (ref 78.0–100.0)
Platelets: 189 10*3/uL (ref 150–400)
RBC: 4.26 MIL/uL (ref 4.22–5.81)
WBC: 5.4 10*3/uL (ref 4.0–10.5)

## 2012-01-30 LAB — C-REACTIVE PROTEIN: CRP: 0.09 mg/dL (ref <0.60)

## 2012-01-30 MED ORDER — CEPHALEXIN 500 MG PO CAPS: 1000.0000 mg | ORAL_CAPSULE | Freq: Two times a day (BID) | ORAL | Status: AC

## 2012-01-30 NOTE — Progress Notes (Signed)
  Subjective:    Patient ID: [name] [name], male    DOB: 02-01-1956, 56 y.o.   MRN: [id number]  HPI 56 yo M with a history of a snake bite to his left index finger in August of 2012. He had some loss of skin after this. He required an I&D of the eschar, as well as split her full thickness skin grafting on roughly 06/08/2011. He was treated with Bactrim December of 2012. After this treatment, he noted that the swelling and erythema that he had his wound increased significant decreased significantly. In the beginning of February 2013 he noted more redness and swelling as well as drainage from his wound. He was restarted on antibiotics at that time (11/16/2011) . He returned to the OR for I&D on 11/27/2011. His culture was positive for MSSA (resistant to penicillin).  He was started on IV ancef 12-14-11. Had repeat I & D 01-23-12. Had last IV anbx on 01-27-12 (day 44). Still has PIC in.  Feels like his finger is better- less swelling, no drainage. No fever or chills. Has noted heat in his hand.     Review of Systems     Objective:   Physical Exam  Constitutional: He appears well-developed and well-nourished.  Musculoskeletal:       Arms:         Assessment & Plan:

## 2012-01-30 NOTE — Progress Notes (Signed)
Addended by: Mariea Clonts D on: 01/30/2012 12:36 PM   Modules accepted: Orders

## 2012-01-30 NOTE — Progress Notes (Signed)
Lab draw from PICC : Pt. identified w/ name and DOB. Donned gloves. Clean tubing/hub connection cap with CHG wipe for 20 seconds, using scrubbing motion. Attached empty, sterile 10 cc syringe, opened clamp, withdrew 10 cc of waste and set aside. Attached next syringe and withrew 16 cc of blood, transferred to lab tube. Order to remove PICC line obtained from Dr. Ninetta Lights. Pt. identified with name and date of birth. PICC dressing removed, site unremarkable. Sutures removed PICC line removed using sterile procedure @ 1215 pm. PICC length equal to that noted in pt's hospital chart of 83 cm. Sterile petroleum gauze + sterile 4X4 applied to PICC site, pressure applied for 10 minutes and covered with Medipore tape as a pressure dressing. Pt. instructed to limit use of arm for 1 hour. Pt. instructed that the pressure dressing should remain in place for 24 hours. Pt. verablized understanding of these instructions.

## 2012-01-30 NOTE — Assessment & Plan Note (Signed)
Overall his finger looks good. Will pull his PIC today, check ESR/CRP/CBC today. Will start him on keflex and have him return in 1 month. He will call in the intervening period if he has problems taking keflex or problems with his wound.

## 2012-02-29 ENCOUNTER — Other Ambulatory Visit: Payer: Self-pay | Admitting: Infectious Diseases

## 2012-03-07 ENCOUNTER — Ambulatory Visit: Payer: 59 | Admitting: Infectious Diseases

## 2012-03-26 ENCOUNTER — Other Ambulatory Visit: Payer: Self-pay | Admitting: Orthopedic Surgery

## 2012-03-30 NOTE — Progress Notes (Signed)
This is 3rd time here-had skin graft-got antibiotics- Will need [employer]

## 2012-04-03 ENCOUNTER — Encounter (HOSPITAL_BASED_OUTPATIENT_CLINIC_OR_DEPARTMENT_OTHER): Payer: Self-pay | Admitting: Anesthesiology

## 2012-04-03 ENCOUNTER — Encounter (HOSPITAL_BASED_OUTPATIENT_CLINIC_OR_DEPARTMENT_OTHER)
Admission: RE | Disposition: A | Discharge status: Home / Self Care | Payer: Self-pay | Source: Ambulatory Visit | Attending: Orthopedic Surgery

## 2012-04-03 ENCOUNTER — Encounter (HOSPITAL_BASED_OUTPATIENT_CLINIC_OR_DEPARTMENT_OTHER): Payer: Self-pay | Admitting: Orthopedic Surgery

## 2012-04-03 ENCOUNTER — Ambulatory Visit (HOSPITAL_BASED_OUTPATIENT_CLINIC_OR_DEPARTMENT_OTHER)
Admission: RE | Admit: 2012-04-03 | Discharge: 2012-04-03 | Disposition: A | Discharge status: Home / Self Care | Payer: 59 | Source: Ambulatory Visit | Attending: Orthopedic Surgery | Admitting: Orthopedic Surgery

## 2012-04-03 ENCOUNTER — Ambulatory Visit (HOSPITAL_BASED_OUTPATIENT_CLINIC_OR_DEPARTMENT_OTHER): Payer: 59 | Admitting: Anesthesiology

## 2012-04-03 DIAGNOSIS — I10 Essential (primary) hypertension: Secondary | ICD-10-CM | POA: Insufficient documentation

## 2012-04-03 DIAGNOSIS — Y834 Other reconstructive surgery as the cause of abnormal reaction of the patient, or of later complication, without mention of misadventure at the time of the procedure: Secondary | ICD-10-CM | POA: Insufficient documentation

## 2012-04-03 DIAGNOSIS — E7800 Pure hypercholesterolemia, unspecified: Secondary | ICD-10-CM | POA: Insufficient documentation

## 2012-04-03 DIAGNOSIS — F17200 Nicotine dependence, unspecified, uncomplicated: Secondary | ICD-10-CM | POA: Insufficient documentation

## 2012-04-03 DIAGNOSIS — T8189XA Other complications of procedures, not elsewhere classified, initial encounter: Secondary | ICD-10-CM | POA: Insufficient documentation

## 2012-04-03 LAB — POCT I-STAT, CHEM 8
Calcium, Ion: 1.2 mmol/L (ref 1.12–1.32)
Creatinine, Ser: 0.9 mg/dL (ref 0.50–1.35)
Glucose, Bld: 112 mg/dL — ABNORMAL HIGH (ref 70–99)
HCT: 46 % (ref 39.0–52.0)
Hemoglobin: 15.6 g/dL (ref 13.0–17.0)
TCO2: 21 mmol/L (ref 0–100)

## 2012-04-03 SURGERY — CREATION, FLAP, ROTATION
Anesthesia: General | Site: Finger | Laterality: Right | Wound class: Clean

## 2012-04-03 MED ORDER — METOCLOPRAMIDE HCL 5 MG/ML IJ SOLN: 10.0000 mg | Freq: Once | INTRAMUSCULAR | Status: DC | PRN

## 2012-04-03 MED ORDER — OXYCODONE-ACETAMINOPHEN 5-325 MG PO TABS: ORAL_TABLET | ORAL | Status: AC

## 2012-04-03 MED ORDER — LACTATED RINGERS IV SOLN
INTRAVENOUS | Status: DC
  Administered 2012-04-03 (×3): via INTRAVENOUS

## 2012-04-03 MED ORDER — HYDROMORPHONE HCL PF 1 MG/ML IJ SOLN: 0.2500 mg | INTRAMUSCULAR | Status: DC | PRN

## 2012-04-03 MED ORDER — PROPOFOL 10 MG/ML IV EMUL
INTRAVENOUS | Status: DC | PRN
  Administered 2012-04-03: 180 mg via INTRAVENOUS

## 2012-04-03 MED ORDER — FENTANYL CITRATE 0.05 MG/ML IJ SOLN
INTRAMUSCULAR | Status: DC | PRN
  Administered 2012-04-03: 50 ug via INTRAVENOUS

## 2012-04-03 MED ORDER — DEXAMETHASONE SODIUM PHOSPHATE 4 MG/ML IJ SOLN
INTRAMUSCULAR | Status: DC | PRN
  Administered 2012-04-03: 10 mg via INTRAVENOUS

## 2012-04-03 MED ORDER — CHLORHEXIDINE GLUCONATE 4 % EX LIQD: 60.0000 mL | Freq: Once | CUTANEOUS | Status: DC

## 2012-04-03 MED ORDER — BUPIVACAINE HCL (PF) 0.25 % IJ SOLN
INTRAMUSCULAR | Status: DC | PRN
  Administered 2012-04-03: 10 mL

## 2012-04-03 MED ORDER — CEPHALEXIN 500 MG PO CAPS: 500.0000 mg | ORAL_CAPSULE | Freq: Four times a day (QID) | ORAL | Status: AC

## 2012-04-03 MED ORDER — ONDANSETRON HCL 4 MG/2ML IJ SOLN
INTRAMUSCULAR | Status: DC | PRN
  Administered 2012-04-03: 4 mg via INTRAVENOUS

## 2012-04-03 MED ORDER — EPHEDRINE SULFATE 50 MG/ML IJ SOLN
INTRAMUSCULAR | Status: DC | PRN
  Administered 2012-04-03: 10 mg via INTRAVENOUS

## 2012-04-03 MED ORDER — MIDAZOLAM HCL 5 MG/5ML IJ SOLN
INTRAMUSCULAR | Status: DC | PRN
  Administered 2012-04-03: 1 mg via INTRAVENOUS

## 2012-04-03 MED ORDER — LIDOCAINE HCL (CARDIAC) 20 MG/ML IV SOLN
INTRAVENOUS | Status: DC | PRN
  Administered 2012-04-03: 50 mg via INTRAVENOUS

## 2012-04-03 MED ORDER — OXYCODONE HCL 5 MG PO TABS: 5.0000 mg | ORAL_TABLET | Freq: Once | ORAL | Status: DC | PRN

## 2012-04-03 MED ORDER — DROPERIDOL 2.5 MG/ML IJ SOLN
INTRAMUSCULAR | Status: DC | PRN
  Administered 2012-04-03: 0.625 mg via INTRAVENOUS

## 2012-04-03 MED ORDER — VANCOMYCIN HCL IN DEXTROSE 1-5 GM/200ML-% IV SOLN
1000.0000 mg | INTRAVENOUS | Status: AC
  Administered 2012-04-03: 1000 mg via INTRAVENOUS

## 2012-04-03 SURGICAL SUPPLY — 53 items
BANDAGE CONFORM 2  STR LF (GAUZE/BANDAGES/DRESSINGS) ×2 IMPLANT
BANDAGE ELASTIC 3 VELCRO ST LF (GAUZE/BANDAGES/DRESSINGS) ×3 IMPLANT
BANDAGE GAUZE ELAST BULKY 4 IN (GAUZE/BANDAGES/DRESSINGS) ×3 IMPLANT
BLADE MINI RND TIP GREEN BEAV (BLADE) IMPLANT
BLADE SURG 15 STRL LF DISP TIS (BLADE) ×4 IMPLANT
BLADE SURG 15 STRL SS (BLADE) ×6
BNDG CMPR 9X4 STRL LF SNTH (GAUZE/BANDAGES/DRESSINGS)
BNDG CMPR MD 5X2 ELC HKLP STRL (GAUZE/BANDAGES/DRESSINGS)
BNDG COHESIVE 1X5 TAN STRL LF (GAUZE/BANDAGES/DRESSINGS) ×2 IMPLANT
BNDG ELASTIC 2 VLCR STRL LF (GAUZE/BANDAGES/DRESSINGS) IMPLANT
BNDG ESMARK 4X9 LF (GAUZE/BANDAGES/DRESSINGS) IMPLANT
BRUSH SCRUB EZ PLAIN DRY (MISCELLANEOUS) ×2 IMPLANT
CHLORAPREP W/TINT 26ML (MISCELLANEOUS) ×1 IMPLANT
CLOTH BEACON ORANGE TIMEOUT ST (SAFETY) ×3 IMPLANT
CORDS BIPOLAR (ELECTRODE) ×3 IMPLANT
COVER MAYO STAND STRL (DRAPES) ×3 IMPLANT
COVER TABLE BACK 60X90 (DRAPES) ×3 IMPLANT
CUFF TOURNIQUET SINGLE 18IN (TOURNIQUET CUFF) ×3 IMPLANT
DRAPE EXTREMITY T 121X128X90 (DRAPE) ×3 IMPLANT
DRAPE SURG 17X23 STRL (DRAPES) ×3 IMPLANT
DRSG PAD ABDOMINAL 8X10 ST (GAUZE/BANDAGES/DRESSINGS) IMPLANT
GAUZE XEROFORM 1X8 LF (GAUZE/BANDAGES/DRESSINGS) ×3 IMPLANT
GLOVE BIO SURGEON STRL SZ 6.5 (GLOVE) ×2 IMPLANT
GLOVE BIO SURGEON STRL SZ7.5 (GLOVE) ×3 IMPLANT
GLOVE BIOGEL PI IND STRL 7.0 (GLOVE) ×1 IMPLANT
GLOVE BIOGEL PI INDICATOR 7.0 (GLOVE) ×1
GLOVE SKINSENSE NS SZ7.0 (GLOVE) ×1
GLOVE SKINSENSE STRL SZ7.0 (GLOVE) ×1 IMPLANT
GLOVE SURG ORTHO 8.0 STRL STRW (GLOVE) ×2 IMPLANT
GOWN PREVENTION PLUS XLARGE (GOWN DISPOSABLE) ×5 IMPLANT
GOWN PREVENTION PLUS XXLARGE (GOWN DISPOSABLE) ×5 IMPLANT
NDL HYPO 25X1 1.5 SAFETY (NEEDLE) IMPLANT
NEEDLE HYPO 25X1 1.5 SAFETY (NEEDLE) ×3 IMPLANT
NS IRRIG 1000ML POUR BTL (IV SOLUTION) ×3 IMPLANT
PACK BASIN DAY SURGERY FS (CUSTOM PROCEDURE TRAY) ×3 IMPLANT
PADDING CAST ABS 4INX4YD NS (CAST SUPPLIES) ×1
PADDING CAST ABS COTTON 4X4 ST (CAST SUPPLIES) ×2 IMPLANT
SPLINT FNGR PLAIN END 5/8X3.25 (CAST SUPPLIES) ×1 IMPLANT
SPLINT PLASTALUME 3 1/4 (CAST SUPPLIES) ×3
SPONGE GAUZE 4X4 12PLY (GAUZE/BANDAGES/DRESSINGS) ×3 IMPLANT
STOCKINETTE 4X48 STRL (DRAPES) ×3 IMPLANT
STRIP CLOSURE SKIN 1/2X4 (GAUZE/BANDAGES/DRESSINGS) IMPLANT
SUT ETHILON 4 0 PS 2 18 (SUTURE) ×3 IMPLANT
SUT ETHILON 5 0 P 3 18 (SUTURE) ×1
SUT MNCRL AB 4-0 PS2 18 (SUTURE) IMPLANT
SUT NYLON ETHILON 5-0 P-3 1X18 (SUTURE) ×1 IMPLANT
SUT VICRYL 4-0 PS2 18IN ABS (SUTURE) IMPLANT
SYR BULB 3OZ (MISCELLANEOUS) ×3 IMPLANT
SYR CONTROL 10ML LL (SYRINGE) ×2 IMPLANT
TOWEL OR 17X24 6PK STRL BLUE (TOWEL DISPOSABLE) ×4 IMPLANT
TRAY DSU PREP LF (CUSTOM PROCEDURE TRAY) ×2 IMPLANT
UNDERPAD 30X30 INCONTINENT (UNDERPADS AND DIAPERS) ×3 IMPLANT
WATER STERILE IRR 1000ML POUR (IV SOLUTION) ×1 IMPLANT

## 2012-04-03 NOTE — Transfer of Care (Signed)
Immediate Anesthesia Transfer of Care Note  Patient: [name] [name]  Procedure(s) Performed: Procedure(s) (LRB): ROTATION FLAP (Right)  Patient Location: PACU  Anesthesia Type: General  Level of Consciousness: awake, alert  and oriented  Airway & Oxygen Therapy: Patient Spontanous Breathing and Patient connected to face mask oxygen  Post-op Assessment: Report given to PACU RN and Post -op Vital signs reviewed and stable  Post vital signs: Reviewed and stable  Complications: No apparent anesthesia complications

## 2012-04-03 NOTE — Anesthesia Preprocedure Evaluation (Signed)
Anesthesia Evaluation  Patient identified by MRN, date of birth, ID band Patient awake    Reviewed: Allergy & Precautions, H&P , NPO status , Patient's Chart, lab work & pertinent test results, reviewed documented beta blocker date and time   History of Anesthesia Complications (+) PONV  Airway Mallampati: II TM Distance: >3 FB Neck ROM: full    Dental   Pulmonary neg pulmonary ROS,          Cardiovascular hypertension, Pt. on medications     Neuro/Psych negative neurological ROS  negative psych ROS   GI/Hepatic negative GI ROS, (+)     substance abuse  alcohol use,   Endo/Other  negative endocrine ROS  Renal/GU negative Renal ROS  negative genitourinary   Musculoskeletal   Abdominal   Peds  Hematology negative hematology ROS (+)   Anesthesia Other Findings See surgeon's H&P   Reproductive/Obstetrics negative OB ROS                           Anesthesia Physical Anesthesia Plan  ASA: II  Anesthesia Plan: General   Post-op Pain Management:    Induction: Intravenous  Airway Management Planned: LMA  Additional Equipment:   Intra-op Plan:   Post-operative Plan: Extubation in OR  Informed Consent: I have reviewed the patients History and Physical, chart, labs and discussed the procedure including the risks, benefits and alternatives for the proposed anesthesia with the patient or authorized representative who has indicated his/her understanding and acceptance.   Dental Advisory Given  Plan Discussed with: CRNA and Surgeon  Anesthesia Plan Comments:         Anesthesia Quick Evaluation

## 2012-04-03 NOTE — Anesthesia Procedure Notes (Signed)
Procedure Name: LMA Insertion Date/Time: 04/03/2012 9:37 AM Performed by: Gar Gibbon Pre-anesthesia Checklist: Patient identified, Emergency Drugs available, Suction available and Patient being monitored Patient Re-evaluated:Patient Re-evaluated prior to inductionOxygen Delivery Method: Circle System Utilized Preoxygenation: Pre-oxygenation with 100% oxygen Intubation Type: IV induction Ventilation: Mask ventilation without difficulty LMA: LMA inserted LMA Size: 4.0 Number of attempts: 1 Airway Equipment and Method: bite block Placement Confirmation: positive ETCO2 Tube secured with: Tape Dental Injury: Teeth and Oropharynx as per pre-operative assessment

## 2012-04-03 NOTE — Op Note (Signed)
Dictation [id number]

## 2012-04-03 NOTE — Op Note (Signed)
NAME:  [name] [name], [name] [name] NO.:  [id number]  MEDICAL RECORD NO.:  [id number]  LOCATION:                                 FACILITY:  PHYSICIAN:  Betha Loa, MD        DATE OF BIRTH:  1956/05/24  DATE OF PROCEDURE:  04/03/2012 DATE OF DISCHARGE:                              OPERATIVE REPORT   PREOPERATIVE DIAGNOSIS:  Nonhealing wound, right index finger.  POSTOPERATIVE DIAGNOSIS:  Nonhealing wound, right index finger.  PROCEDURE:  Irrigation and debridement of right index finger PIP joint with bony specimen and rotational skin flap, right index finger.  SURGEON:  Betha Loa, MD.  ASSISTANT:  Cindee Salt, MD.  ANESTHESIA:  General.  IV FLUIDS:  Per anesthesia flow sheet.  ESTIMATED BLOOD LOSS:  Minimal.  COMPLICATIONS:  None.  SPECIMENS:  Right index finger proximal phalanx bone to Micro for cultures.  TOURNIQUET TIME:  30 minutes.  DISPOSITION:  Stable to PACU.  INDICATIONS:  [name] [name] is a 56 year old male, who was bitten by a snake approximately 1 year ago.  He had skin loss on the index finger from this.  This was treated with full-thickness skin graft.  He has a chronic wound at the PIP joint that required attempts at irrigation and debridement of bony osteomyelitis and ACell grafting.  The ACell failed. He was returned to the OR today for rotational skin flap for coverage of the PIP joint, which is exposed.  Risks, benefits, and alternatives of surgery had been discussed including the risk of blood loss, infection, damage to nerves, vessels, tendons, ligaments, bone, failure of surgery, need for additional surgery, complications, wound healing, continued pain, and failure of the flap.  Risks, benefits and alternatives of surgery were discussed including the risk of blood loss, infection, damage to nerves, vessels, tendons, ligaments, bone, fair surgery, need for additional surgery, complications, wound healing, continued pain, continued  numbness.  He voiced understanding of these risks and elected to proceed.  OPERATIVE COURSE:  After being identified preoperatively by myself, the patient and I agreed upon the procedure and site of the procedure.  The surgical site was marked.  The risks, benefits, and alternatives of surgery were reviewed and he wished to proceed.  Surgical consent had been signed.  He was given IV antibiotics as preoperative antibiotic prophylaxis.  He was transported to the operating room and placed on the operating room table in supine position with right upper extremity in arm board.  General anesthesia was induced by the anesthesiologist.  The right upper extremity was prepped and draped in the normal sterile orthopedic fashion.  A surgical pause was performed between surgeons, anesthesia, operating staff, and all were in agreement with the patient, procedure, and site of procedure.  Tourniquet to proximal aspect of the extremity was inflated to 250 mmHg after exsanguination of limb with Esmarch bandage.  The wound was explored.  The radial side of the distal aspect of the proximal phalanx was soft.  This was removed with a curette and rongeur, and sent to Micro for cultures.  The wound and joint were copiously irrigated with 500 mL of sterile saline.  There was no  gross contamination and no evidence of purulence.  The rotational skin flap was fashioned.  The full-thickness skin flap was developed. It was able to be rotated into the defect.  5-0 nylon suture was used to secure the rotational flap in its new position.  The tourniquet was deflated at 30 minutes.  The fingertip was pink with brisk capillary refill.  The flap was washed to ensure it turn pink.  Some sutures were removed to ensure full pinkness of the entire flap, which was the case. The remaining defect at the ulnar side of the PIP joint was left to granulate in.  There was good peritenon in this area.  A digital block was performed  with 10 mL of 0.25%  plain Marcaine to aid in postoperative analgesia.  The wound was dressed with sterile Xeroform, 4x4s, and wrapped with a Kling bandage.  An AlumaFoam splint was placed volarly and wrapped with Kling and Coban dressing lightly.  The operative drapes were broken down and the patient was awoken from anesthesia safely.  He was transferred back to stretcher and taken to PACU in stable condition.  I will see him back in the office in 1 week for postoperative followup.  I will give him Percocet 5/325, 1-2 p.o. q.6 hours p.r.n. pain, dispensed #30.     Betha Loa, MD     KK/MEDQ  D:  04/03/2012  T:  04/03/2012  Job:  [id number]

## 2012-04-03 NOTE — Anesthesia Postprocedure Evaluation (Signed)
Anesthesia Post Note  Patient: [name] [name]  Procedure(s) Performed: Procedure(s) (LRB): ROTATION FLAP (Right)  Anesthesia type: General  Patient location: PACU  Post pain: Pain level controlled  Post assessment: Patient's Cardiovascular Status Stable  Last Vitals:  Filed Vitals:   04/03/12 1115  BP: 100/68  Pulse: 73  Temp: 36.5 C  Resp: 16    Post vital signs: Reviewed and stable  Level of consciousness: alert  Complications: No apparent anesthesia complications

## 2012-04-03 NOTE — Discharge Instructions (Addendum)

## 2012-04-03 NOTE — H&P (Signed)
[name] [name] is an 56 y.o. male.   Chief Complaint: non healing wound right index HPI: 56 yo male with snakebite to right index finger 1 year ago.  Suffered skin loss treated with ftsg.  Graft with good survival, but developed wound at proximal aspect.  Treated with I&D of osteo and acell grafting with failure of graft.  Presents today for rotational skin flap coverage of exposed joint.  Past Medical History  Diagnosis Date  . Hypertension   . Hypercholesterolemia   . Tobacco abuse   . Complication of anesthesia   . PONV (postoperative nausea and vomiting)   . History of venomous snake bite     rt index finger-    Past Surgical History  Procedure Date  . Hernia repair     as a child  . Finger exploration 8/12    rt index finger snakebite  . Colonoscopy   . I&d extremity 11/24/2011    Procedure: IRRIGATION AND DEBRIDEMENT EXTREMITY;  Surgeon: Tami Ribas, MD;  Location: [facility];  Service: Orthopedics;  Laterality: Right;  right index  . Mass excision 01/23/2012    Procedure: EXCISION MASS;  Surgeon: Tami Ribas, MD;  Location: [facility];  Service: Orthopedics;  Laterality: Right;  Right index a-cell graft    Family History  Problem Relation Age of Onset  . Diabetes Mother    Social History:  reports that he has been smoking Cigarettes.  He has a 20 pack-year smoking history. He has never used smokeless tobacco. He reports that he drinks alcohol. He reports that he does not use illicit drugs.  Allergies:  Allergies  Allergen Reactions  . Amoxicillin-Pot Clavulanate Nausea And Vomiting    Medications Prior to Admission  Medication Sig Dispense Refill  . aspirin 325 MG EC tablet Take 325-650 mg by mouth daily.        . cephALEXin (KEFLEX) 500 MG capsule Take 500 mg by mouth 4 (four) times daily.      . Multiple Vitamins-Minerals (CENTRUM ULTRA MENS) TABS Take 1 tablet by mouth daily.        Marland Kitchen olmesartan-hydrochlorothiazide (BENICAR HCT)  40-25 MG per tablet Take 1 tablet by mouth daily.        . Omega-3 Fatty Acids (FISH OIL) 1000 MG CAPS Take 1 capsule by mouth daily.        . rosuvastatin (CRESTOR) 10 MG tablet Take 10 mg by mouth at bedtime.        Marland Kitchen oxyCODONE-acetaminophen (PERCOCET) 5-325 MG per tablet Take 1 tablet by mouth every 4 (four) hours as needed.        No results found for this or any previous visit (from the past 48 hour(s)).  No results found.   A comprehensive review of systems was negative except for: Eyes: positive for contacts/glasses  Blood pressure 109/74, pulse 81, temperature 98.4 F (36.9 C), temperature source Oral, resp. rate 18, SpO2 99.00%.  General appearance: alert, cooperative and appears stated age Head: Normocephalic, without obvious abnormality, atraumatic Neck: supple, symmetrical, trachea midline Resp: clear to auscultation bilaterally Cardio: regular rate and rhythm GI: soft, non-tender; bowel sounds normal; no masses,  no organomegaly Extremities: decreased sensation on radial side of index.  good capillary refill.  otherwise intact sensation and capillary refill.  +epl/fpl/io.  right index finger stiff in flexion. Pulses: 2+ and symmetric Skin: wound at radial pip of right index.  exposed bone in base. Neurologic: Grossly normal Incision/Wound: As above  Assessment/Plan Right index finger non healing wound.  Discussed treatment options with patient.  He wises to undergo rotational skin flap for coverage.  Risks, benefits, and alternatives of surgery were discussed and the patient agrees with the plan of care.   Lenetta Piche R 04/03/2012, 8:53 AM

## 2012-04-08 LAB — ANAEROBIC CULTURE

## 2012-04-10 ENCOUNTER — Encounter (HOSPITAL_BASED_OUTPATIENT_CLINIC_OR_DEPARTMENT_OTHER): Payer: Self-pay

## 2012-05-03 LAB — FUNGUS CULTURE W SMEAR

## 2012-05-16 LAB — AFB CULTURE WITH SMEAR (NOT AT ARMC): Acid Fast Smear: NONE SEEN

## 2016-01-11 ENCOUNTER — Emergency Department (HOSPITAL_COMMUNITY)
Admission: EM | Admit: 2016-01-11 | Discharge: 2016-01-11 | Disposition: A | Discharge status: Home / Self Care | Payer: 59 | Attending: Emergency Medicine | Admitting: Emergency Medicine

## 2016-01-11 ENCOUNTER — Encounter (HOSPITAL_COMMUNITY): Payer: Self-pay | Admitting: Emergency Medicine

## 2016-01-11 DIAGNOSIS — Z792 Long term (current) use of antibiotics: Secondary | ICD-10-CM | POA: Insufficient documentation

## 2016-01-11 DIAGNOSIS — I10 Essential (primary) hypertension: Secondary | ICD-10-CM | POA: Diagnosis not present

## 2016-01-11 DIAGNOSIS — E7800 Pure hypercholesterolemia, unspecified: Secondary | ICD-10-CM | POA: Insufficient documentation

## 2016-01-11 DIAGNOSIS — Z7982 Long term (current) use of aspirin: Secondary | ICD-10-CM | POA: Insufficient documentation

## 2016-01-11 DIAGNOSIS — Z79899 Other long term (current) drug therapy: Secondary | ICD-10-CM | POA: Insufficient documentation

## 2016-01-11 DIAGNOSIS — M4800 Spinal stenosis, site unspecified: Secondary | ICD-10-CM

## 2016-01-11 DIAGNOSIS — M545 Low back pain: Secondary | ICD-10-CM | POA: Diagnosis present

## 2016-01-11 DIAGNOSIS — Z880 Allergy status to penicillin: Secondary | ICD-10-CM | POA: Diagnosis not present

## 2016-01-11 DIAGNOSIS — F17210 Nicotine dependence, cigarettes, uncomplicated: Secondary | ICD-10-CM | POA: Diagnosis not present

## 2016-01-11 HISTORY — DX: Spinal stenosis, site unspecified: M48.00

## 2016-01-11 MED ORDER — HYDROCODONE-ACETAMINOPHEN 5-325 MG PO TABS
1.0000 | ORAL_TABLET | Freq: Once | ORAL | Status: AC
  Administered 2016-01-11: 1 via ORAL
  Filled 2016-01-11: qty 1

## 2016-01-11 MED ORDER — KETOROLAC TROMETHAMINE 60 MG/2ML IM SOLN
60.0000 mg | Freq: Once | INTRAMUSCULAR | Status: AC
  Administered 2016-01-11: 60 mg via INTRAMUSCULAR
  Filled 2016-01-11: qty 2

## 2016-01-11 NOTE — ED Provider Notes (Signed)
CSN: [id number]     Arrival date & time 01/11/16  [id number] History  By signing my name below, I, [name] [name], attest that this documentation has been prepared under the direction and in the presence of [name] Hayashi, PA-C. Electronically Signed: Stephania [name], ED Scribe. 01/11/2016. 10:14 AM.    Chief Complaint  Patient presents with  . Back Pain   The history is provided by the patient. No language interpreter was used.    HPI Comments: [name] [name] is a 60 y.o. male with a history of spinal stenosis, hypertension, and hyperlipidemia, who presents to the Emergency Department complaining of chronic, constant, worsening lower back pain radiating down his LLE that began 1 month ago and acutely worsened last night. He denies any new injury. He states he had called a nurse's line and was advised to come to the ED to rule out any emergent causes of worsening back pain. Patient states his PCP has been providing him pain medications, muscle relaxants, and prednisone. He states he had an MRI 6 days ago and was told he would be referred to Dr. Arnoldo Morale for a new diagnosis of "severe spinal stenosis," per pt. Patient states he has run out of pain medications and needs refills - per [place] Controlled Substances Database, patient last had a prescription of 60 tablets of hydrocodone prescribed by his PCP on 12/14/15. Patient states his pain is worse with walking, and he is forced to walk with an antalgic, hunched gait. He denies a prior history of injury causing his back pain. He denies bowel or bladder incontinence or saddle anesthesia.  Past Medical History  Diagnosis Date  . Hypertension   . Hypercholesterolemia   . Tobacco abuse   . Complication of anesthesia   . PONV (postoperative nausea and vomiting)   . History of venomous snake bite     rt index finger-  . Spinal stenosis    Past Surgical History  Procedure Laterality Date  . Hernia repair      as a child  . Finger exploration  8/12    rt index finger  snakebite  . Colonoscopy    . I&d extremity  11/24/2011    Procedure: IRRIGATION AND DEBRIDEMENT EXTREMITY;  Surgeon: Tennis Must, MD;  Location: [facility];  Service: Orthopedics;  Laterality: Right;  right index  . Mass excision  01/23/2012    Procedure: EXCISION MASS;  Surgeon: Tennis Must, MD;  Location: [facility];  Service: Orthopedics;  Laterality: Right;  Right index a-cell graft   Family History  Problem Relation Age of Onset  . Diabetes Mother    Social History  Substance Use Topics  . Smoking status: Current Every Day Smoker -- 1.00 packs/day for 20 years    Types: Cigarettes  . Smokeless tobacco: Never Used  . Alcohol Use: Yes     Comment: drinks a fifth in a week sometimes    Review of Systems  Gastrointestinal:       Negative for bowel incontinence.  Genitourinary:       Negative for bladder incontinence.  Musculoskeletal: Positive for back pain.  Neurological: Negative for numbness.      Allergies  Amoxicillin-pot clavulanate  Home Medications   Prior to Admission medications   Medication Sig Start Date End Date Taking? Authorizing Provider  aspirin 325 MG EC tablet Take 325-650 mg by mouth daily.      Historical Provider, MD  cephALEXin (KEFLEX) 500 MG capsule Take 500  mg by mouth 4 (four) times daily.    Historical Provider, MD  Multiple Vitamins-Minerals (CENTRUM ULTRA MENS) TABS Take 1 tablet by mouth daily.      Historical Provider, MD  olmesartan-hydrochlorothiazide (BENICAR HCT) 40-25 MG per tablet Take 1 tablet by mouth daily.      Historical Provider, MD  Omega-3 Fatty Acids (FISH OIL) 1000 MG CAPS Take 1 capsule by mouth daily.      Historical Provider, MD  oxyCODONE-acetaminophen (PERCOCET) 5-325 MG per tablet Take 1 tablet by mouth every 4 (four) hours as needed.    Historical Provider, MD  rosuvastatin (CRESTOR) 10 MG tablet Take 10 mg by mouth at bedtime.      Historical Provider, MD   BP 144/92 mmHg  Pulse 99   Temp(Src) 98.2 F (36.8 C) (Oral)  Resp 18  Ht 5\' 8"  (1.727 m)  Wt 180 lb (81.647 kg)  BMI 27.38 kg/m2  SpO2 99% Physical Exam  Constitutional: He is oriented to person, place, and time. He appears well-developed and well-nourished. No distress.  HENT:  Head: Normocephalic and atraumatic.  Eyes: Conjunctivae are normal. Right eye exhibits no discharge. Left eye exhibits no discharge. No scleral icterus.  Cardiovascular: Normal rate.   Pulmonary/Chest: Effort normal.  Musculoskeletal:  No midline spinal tenderness. FROM of C, T, and L spines. Negative SLR. No step-offs, crepitus, or bony deformities.  Neurological: He is alert and oriented to person, place, and time. Coordination normal.  Strength 5/5. No sensory deficits.   Skin: Skin is warm and dry. No rash noted. He is not diaphoretic. No erythema. No pallor.  Psychiatric: He has a normal mood and affect. His behavior is normal.  Nursing note and vitals reviewed.   ED Course  Procedures (including critical care time)  DIAGNOSTIC STUDIES: Oxygen Saturation is 99% on RA, normal by my interpretation.    COORDINATION OF CARE: 10:12 AM - Reassured pt that I do not believe there is any emergent cause of his worsening pain at this time. Discussed with pt that the ED is not able to provide refills of chronic pain medication, but we can manage pain while in the ED. Advised pt to return to his PCP for refills of pain medication. Pt verbalized understanding and agreed to plan.   MDM   Final diagnoses:  Spinal stenosis, unspecified spinal region   Patient with chronic back pain x 1 month.  Pt had an MRI 6 days ago and was referred by his PCP to Dr. Arnoldo Morale for a new diagnosis of spinal stenosis.  He has had pain medication prescribed to him by his PCP (last given 60 tablets of hydrocodone on 12/11/15, per [place] Controlled Substances database), but states he has since run out.  Discussed with pt that the ED is not able to provide refills of  chronic pain medication, but we can manage pain while in the ED.  Pt advised to follow up with his PCP for refills of pain medication.  No neurological deficits and normal neuro exam.  Patient can walk but states is painful.  No loss of bowel or bladder control.  No concern for cauda equina.  No fever, night sweats, weight loss, h/o cancer, IVDU. Return precautions outlined in patient discharge instructions.      I personally performed the services described in this documentation, which was scribed in my presence. The recorded information has been reviewed and is accurate.       Carlos Levering, PA-C 01/13/16 1040  Shanon Brow  Tamsen Meek, MD 01/13/16 [phone]

## 2016-01-11 NOTE — Discharge Instructions (Signed)
Spinal Stenosis Spinal stenosis is an abnormal narrowing of the canals of your spine (vertebrae). CAUSES  Spinal stenosis is caused by areas of bone pushing into the central canals of your vertebrae. This condition can be present at birth (congenital). It also may be caused by arthritic deterioration of your vertebrae (spinal degeneration).  SYMPTOMS   Pain that is generally worse with activities, particularly standing and walking.  Numbness, tingling, hot or cold sensations, weakness, or weariness in your legs.  Frequent episodes of falling.  A foot-slapping gait that leads to muscle weakness. DIAGNOSIS  Spinal stenosis is diagnosed with the use of magnetic resonance imaging (MRI) or computed tomography (CT). TREATMENT  Initial therapy for spinal stenosis focuses on the management of the pain and other symptoms associated with the condition. These therapies include:  Practicing postural changes to lessen pressure on your nerves.  Exercises to strengthen the core of your body.  Loss of excess body weight.  The use of nonsteroidal anti-inflammatory medicines to reduce swelling and inflammation in your nerves. When therapies to manage pain are not successful, surgery to treat spinal stenosis may be recommended. This surgery involves removing excess bone, which puts pressure on your nerve roots. During this surgery (laminectomy), the posterior boney arch (lamina) and excess bone around the facet joints are removed.   This information is not intended to replace advice given to you by your health care provider. Make sure you discuss any questions you have with your health care provider.   Follow up with your primary care provider for re-evaluation and medication refill. Return to the ED if you experience loss of bowel/bladder control, numbness/tingling in both of your lower extremities, fever, severe weakness, inability to lift your legs. Apply ice to affected area.

## 2016-01-11 NOTE — ED Notes (Signed)
Patient states he has had back pain x 1 month.  Patient states his primary doctor has been covering him for pain medications, muscle relaxants, prednisone.  Patient states had MRI last Tuesday and was told he was being referred to Dr. Arnoldo Morale regarding new diagnosis of spinal stenosis.   Patient states he is out of pain medications and needs refills.

## 2016-09-01 ENCOUNTER — Other Ambulatory Visit: Payer: Self-pay | Admitting: Neurosurgery

## 2016-09-01 DIAGNOSIS — M48062 Spinal stenosis, lumbar region with neurogenic claudication: Secondary | ICD-10-CM

## 2016-09-19 ENCOUNTER — Ambulatory Visit
Admission: RE | Admit: 2016-09-19 | Discharge: 2016-09-19 | Disposition: A | Discharge status: Home / Self Care | Payer: 59 | Source: Ambulatory Visit | Attending: Neurosurgery | Admitting: Neurosurgery

## 2016-09-19 DIAGNOSIS — M48062 Spinal stenosis, lumbar region with neurogenic claudication: Secondary | ICD-10-CM

## 2016-09-19 MED ORDER — IOPAMIDOL (ISOVUE-M 200) INJECTION 41%
15.0000 mL | Freq: Once | INTRAMUSCULAR | Status: AC
  Administered 2016-09-19: 15 mL via INTRATHECAL

## 2016-09-19 MED ORDER — OXYCODONE-ACETAMINOPHEN 5-325 MG PO TABS
1.0000 | ORAL_TABLET | Freq: Once | ORAL | Status: AC
  Administered 2016-09-19: 1 via ORAL

## 2016-09-19 MED ORDER — DIAZEPAM 5 MG PO TABS
5.0000 mg | ORAL_TABLET | Freq: Once | ORAL | Status: AC
  Administered 2016-09-19: 5 mg via ORAL

## 2016-09-19 NOTE — Discharge Instructions (Signed)

## 2016-09-22 ENCOUNTER — Telehealth: Payer: Self-pay | Admitting: Radiology

## 2016-09-22 NOTE — Telephone Encounter (Signed)
Pt states no headache and that he did great post myelo. He also said he has a f/u appointment with his dr.

## 2017-06-13 ENCOUNTER — Encounter: Payer: 59 | Admitting: Psychology

## 2017-08-03 DIAGNOSIS — Z9689 Presence of other specified functional implants: Secondary | ICD-10-CM

## 2017-08-03 HISTORY — DX: Presence of other specified functional implants: Z96.89

## 2018-01-18 DIAGNOSIS — M545 Low back pain: Secondary | ICD-10-CM | POA: Diagnosis not present

## 2018-01-18 DIAGNOSIS — M25561 Pain in right knee: Secondary | ICD-10-CM | POA: Diagnosis not present

## 2018-01-22 DIAGNOSIS — M25561 Pain in right knee: Secondary | ICD-10-CM | POA: Diagnosis not present

## 2018-01-22 DIAGNOSIS — M545 Low back pain: Secondary | ICD-10-CM | POA: Diagnosis not present

## 2018-03-12 ENCOUNTER — Encounter: Payer: Self-pay | Admitting: Orthopedic Surgery

## 2018-03-12 ENCOUNTER — Ambulatory Visit (INDEPENDENT_AMBULATORY_CARE_PROVIDER_SITE_OTHER): Payer: BLUE CROSS/BLUE SHIELD

## 2018-03-12 ENCOUNTER — Ambulatory Visit: Payer: BLUE CROSS/BLUE SHIELD | Admitting: Orthopedic Surgery

## 2018-03-12 VITALS — BP 114/81 | HR 90 | Ht 68.0 in | Wt 189.0 lb

## 2018-03-12 DIAGNOSIS — M25551 Pain in right hip: Secondary | ICD-10-CM

## 2018-03-12 DIAGNOSIS — M25561 Pain in right knee: Secondary | ICD-10-CM | POA: Diagnosis not present

## 2018-03-12 DIAGNOSIS — M25461 Effusion, right knee: Secondary | ICD-10-CM | POA: Diagnosis not present

## 2018-03-12 DIAGNOSIS — M879 Osteonecrosis, unspecified: Secondary | ICD-10-CM

## 2018-03-12 NOTE — Addendum Note (Signed)
Addended byCandice Camp on: 03/12/2018 04:14 PM   Modules accepted: Orders

## 2018-03-12 NOTE — Progress Notes (Signed)
NEW PATIENT OFFICE VISIT   Chief Complaint  Patient presents with  . Knee Pain    Right knee pain, no injury, referred by Dr. Arnoldo Morale.     MEDICAL DECISION SECTION  Xrays were done in the office  1.  Pelvis avascular necrosis subchondral fracture right hip 2.  Right knee x-ray shows no significant arthritis or deformity perhaps joint effusion Please see dictated reports  Encounter Diagnoses  Name Primary?  . Right knee pain, unspecified chronicity   . Pain of right hip joint   . Osteonecrosis of right hip ([place]) Yes  . Effusion of right knee     PLAN: (Rx., injectx, surgery, frx, mri/ct) The patient will need a right total hip replacement  I discussed with him direct anterior posterior and lateral approaches.  He will be referred for direct anterior approach right total hip  Regarding his right knee he has no significant arthritis on his knee on x-ray his exam was benign.  He may have had an effusion at the time of his initial knee pain but not having that now.  No orders of the defined types were placed in this encounter.   Chief Complaint  Patient presents with  . Knee Pain    Right knee pain, no injury, referred by Dr. Arnoldo Morale.    62 year old male had lumbar disc surgery eventually had spinal fusion.  He presented to his doctor with a warm swollen knee after driving to [place].  The x-ray was read as subluxation and possible instability of the right knee.  He was sent here for evaluation and treatment  His knee pain started about 2 months ago after he drove to [place].  Both legs were swollen including both knees but the left knee resolve the right knee did not.  When he went to his doctor x-rays were obtained and he was told that he had to see orthopedic surgeon.  However, he says his pain has changed over the last week he now has thigh and groin pain associated with painful weightbearing requiring him to use a cane or walker with no further swelling or pain in  his right knee.   Review of Systems  Constitutional: Negative for chills and fever.  Musculoskeletal: Positive for back pain.       Spinal stimulator has resolved his neurogenic pain  Skin: Negative.   Neurological: Negative for tingling, sensory change, focal weakness and weakness.     Past Medical History:  Diagnosis Date  . Complication of anesthesia   . History of venomous snake bite    rt index finger-  . Hypercholesterolemia   . Hypertension   . PONV (postoperative nausea and vomiting)   . Spinal stenosis   . Tobacco abuse     Past Surgical History:  Procedure Laterality Date  . COLONOSCOPY    . FINGER EXPLORATION  8/12   rt index finger snakebite  . HERNIA REPAIR     as a child  . I&D EXTREMITY  11/24/2011   Procedure: IRRIGATION AND DEBRIDEMENT EXTREMITY;  Surgeon: Tennis Must, MD;  Location: [facility];  Service: Orthopedics;  Laterality: Right;  right index  . MASS EXCISION  01/23/2012   Procedure: EXCISION MASS;  Surgeon: Tennis Must, MD;  Location: [facility];  Service: Orthopedics;  Laterality: Right;  Right index a-cell graft    Family History  Problem Relation Age of Onset  . Diabetes Mother    Social History   Tobacco Use  .  Smoking status: Current Every Day Smoker    Packs/day: 1.00    Years: 20.00    Pack years: 20.00    Types: Cigarettes  . Smokeless tobacco: Never Used  Substance Use Topics  . Alcohol use: Yes    Comment: drinks a fifth in a week sometimes  . Drug use: No   Allergies  Allergen Reactions  . Codeine Other (See Comments)    "Gives me a headache for days."    No outpatient medications have been marked as taking for the 03/12/18 encounter (Office Visit) with Carole Civil, MD.    BP 114/81   Pulse 90   Ht 5\' 8"  (1.727 m)   Wt 189 lb (85.7 kg)   BMI 28.74 kg/m   Physical Exam  Constitutional: He is oriented to person, place, and time. He appears well-developed and  well-nourished.  Vital signs have been reviewed and are stable. Gen. appearance the patient is well-developed and well-nourished with normal grooming and hygiene.   Neurological: He is alert and oriented to person, place, and time.  He is using a cane he has a significantly abnormal gait   Skin: Skin is warm and dry. No erythema.  Psychiatric: He has a normal mood and affect.  Vitals reviewed.   Ortho Exam Left hip normal range of motion no tenderness hip is stable strength is normal skin is excellent no erythema no rashes sensation is normal no edema  Right hip painful hip flexion the hip goes into external rotation with flexion and internal rotation causes pain in extension and flexion no instability strength and muscle tone normal skin is intact pulses are good sensation is normal  Is to upper extremities have normal range of motion stability strength and alignment   Arther Abbott, MD  03/12/2018 3:40 PM

## 2018-03-12 NOTE — Patient Instructions (Signed)
Total Hip Replacement Total hip replacement is a surgical procedure to remove damaged bone in your hip joint and replace it with an artificial hip joint (prosthetic hip joint). The purpose of this surgery is to reduce pain and to improve your hip function. During a total hip replacement, one or both parts of the hip joint are replaced, depending on the type of joint damage you have. The hip is a ball-and-socket type of joint, and it has two main parts. The ball part of the joint (femoral head) is the top of the thigh bone (femur). The socket part of the joint is a large indent in the side of your pelvis (acetabulum) where the femur and pelvis meet. Tell a health care provider about:  Any allergies you have.  All medicines you are taking, including vitamins, herbs, eye drops, creams, and over-the-counter medicines.  Any problems you or family members have had with anesthetic medicines.  Any blood disorders you have.  Any surgeries you have had.  Any medical conditions you have. What are the risks? Generally, total hip replacement is a safe procedure. However, problems can occur, including:  Infection.  Dislocation (the ball of the hip-joint prosthesis comes out of contact with the socket).  Loosening of the piece (stem) that connects the prosthetic femoral head to the femur.  Fracture of the bone while inserting the prosthesis.  Formation of blood clots, which can break loose and travel to and injure your lungs (pulmonary embolus).  What happens before the procedure?  Plan to have someone take you home after the procedure.  Do not eat or drink anything after midnight on the night before the procedure or as directed by your health care provider.  Ask your health care provider about: ? Changing or stopping your regular medicines. This is especially important if you are taking diabetes medicines or blood thinners. ? Taking medicines such as aspirin and ibuprofen. These medicines can  thin your blood. Do not take these medicines before your procedure if your health care provider asks you not to.  Ask your health care provider about how your surgical site will be marked or identified.  You may be given antibiotic medicines to help prevent infection. What happens during the procedure?  To reduce your risk of infection: ? Your health care team will wash or sanitize their hands. ? Your skin will be washed with soap.  An IV tube will be inserted into one of your veins. You will be given one or more of the following: ? A medicine that makes you drowsy (sedative). ? A medicine that makes you fall asleep (general anesthetic). ? A medicine injected into your spine that numbs your body below the waist (spinal anesthetic).  An incision will be made in your hip. Your surgeon will take out any damaged cartilage and bone.  Your surgeon will then: ? Insert a prosthetic socket into the acetabulum of your pelvis. This is usually secured with screws. ? Remove the femoral head and replace it with a prosthetic ball and stem secured into the top of your femur. ? Place the ball into the socket and check the range of motion and stability of your new hip. ? Close the incision and apply a bandage over the surgical site. What happens after the procedure?  You will stay in a recovery area until the medicines have worn off.  Your vital signs, such as your pulse and blood pressure, will be monitored.  Once you are awake and stable, you  will be taken to a hospital room.  You may be directed to take actions to help prevent blood clots. These may include: ? Walking soon after surgery, with someone assisting you. Moving around after surgery helps to improve blood flow. ? Taking medicines to thin your blood (anticoagulants). ? Wearing compression stockings or using different types of devices.  You will receive physical therapy until you are doing well and your health care provider feels it is  safe for you to go home. This information is not intended to replace advice given to you by your health care provider. Make sure you discuss any questions you have with your health care provider. Document Released: 12/26/2000 Document Revised: 05/23/2016 Document Reviewed: 11/20/2013 Elsevier Interactive Patient Education  [employer].

## 2018-03-13 ENCOUNTER — Telehealth: Payer: Self-pay | Admitting: Orthopedic Surgery

## 2018-03-13 NOTE — Telephone Encounter (Signed)
Patient called asking if he could be referred to [place] instead of [place]. He has an appointment with Dr. Ninfa Linden on Monday, but would rather see Dr. Earlie Counts at [facility].   Please call and advised.  [phone].

## 2018-03-13 NOTE — Telephone Encounter (Signed)
I called him, told him I am not sure Dr Veverly Fells does the anterior hip approach Dr Ninfa Linden does, but would send referral . Antionette Char will call him to schedule the appointment, he will let me know if he does not hear anything.

## 2018-03-19 ENCOUNTER — Ambulatory Visit (INDEPENDENT_AMBULATORY_CARE_PROVIDER_SITE_OTHER): Payer: BLUE CROSS/BLUE SHIELD | Admitting: Orthopaedic Surgery

## 2018-03-21 DIAGNOSIS — M1611 Unilateral primary osteoarthritis, right hip: Secondary | ICD-10-CM | POA: Diagnosis not present

## 2018-03-21 DIAGNOSIS — M87051 Idiopathic aseptic necrosis of right femur: Secondary | ICD-10-CM | POA: Diagnosis not present

## 2018-03-29 DIAGNOSIS — G894 Chronic pain syndrome: Secondary | ICD-10-CM | POA: Diagnosis not present

## 2018-03-29 DIAGNOSIS — E782 Mixed hyperlipidemia: Secondary | ICD-10-CM | POA: Diagnosis not present

## 2018-03-29 DIAGNOSIS — G6182 Multifocal motor neuropathy: Secondary | ICD-10-CM | POA: Diagnosis not present

## 2018-03-29 DIAGNOSIS — Z1389 Encounter for screening for other disorder: Secondary | ICD-10-CM | POA: Diagnosis not present

## 2018-03-29 DIAGNOSIS — E6609 Other obesity due to excess calories: Secondary | ICD-10-CM | POA: Diagnosis not present

## 2018-03-29 DIAGNOSIS — R7309 Other abnormal glucose: Secondary | ICD-10-CM | POA: Diagnosis not present

## 2018-03-29 DIAGNOSIS — M5136 Other intervertebral disc degeneration, lumbar region: Secondary | ICD-10-CM | POA: Diagnosis not present

## 2018-03-29 DIAGNOSIS — I10 Essential (primary) hypertension: Secondary | ICD-10-CM | POA: Diagnosis not present

## 2018-03-29 DIAGNOSIS — T63001D Toxic effect of unspecified snake venom, accidental (unintentional), subsequent encounter: Secondary | ICD-10-CM | POA: Diagnosis not present

## 2018-04-01 DIAGNOSIS — R197 Diarrhea, unspecified: Secondary | ICD-10-CM | POA: Diagnosis not present

## 2018-04-01 DIAGNOSIS — I10 Essential (primary) hypertension: Secondary | ICD-10-CM | POA: Diagnosis not present

## 2018-04-01 DIAGNOSIS — N170 Acute kidney failure with tubular necrosis: Secondary | ICD-10-CM | POA: Diagnosis not present

## 2018-04-01 DIAGNOSIS — N183 Chronic kidney disease, stage 3 (moderate): Secondary | ICD-10-CM | POA: Diagnosis not present

## 2018-04-01 DIAGNOSIS — F17200 Nicotine dependence, unspecified, uncomplicated: Secondary | ICD-10-CM | POA: Diagnosis not present

## 2018-04-01 DIAGNOSIS — D696 Thrombocytopenia, unspecified: Secondary | ICD-10-CM | POA: Diagnosis not present

## 2018-04-01 DIAGNOSIS — N179 Acute kidney failure, unspecified: Secondary | ICD-10-CM | POA: Diagnosis not present

## 2018-04-01 DIAGNOSIS — K760 Fatty (change of) liver, not elsewhere classified: Secondary | ICD-10-CM | POA: Diagnosis not present

## 2018-04-01 DIAGNOSIS — E871 Hypo-osmolality and hyponatremia: Secondary | ICD-10-CM | POA: Diagnosis not present

## 2018-04-01 DIAGNOSIS — E876 Hypokalemia: Secondary | ICD-10-CM | POA: Diagnosis not present

## 2018-04-01 DIAGNOSIS — R112 Nausea with vomiting, unspecified: Secondary | ICD-10-CM | POA: Diagnosis not present

## 2018-04-01 DIAGNOSIS — E872 Acidosis: Secondary | ICD-10-CM | POA: Diagnosis not present

## 2018-04-01 DIAGNOSIS — R109 Unspecified abdominal pain: Secondary | ICD-10-CM | POA: Diagnosis not present

## 2018-04-01 DIAGNOSIS — E785 Hyperlipidemia, unspecified: Secondary | ICD-10-CM | POA: Diagnosis not present

## 2018-04-01 DIAGNOSIS — K859 Acute pancreatitis without necrosis or infection, unspecified: Secondary | ICD-10-CM | POA: Diagnosis not present

## 2018-04-01 DIAGNOSIS — Z79899 Other long term (current) drug therapy: Secondary | ICD-10-CM | POA: Diagnosis not present

## 2018-04-02 ENCOUNTER — Encounter (HOSPITAL_COMMUNITY): Payer: Self-pay

## 2018-04-02 ENCOUNTER — Ambulatory Visit: Payer: Self-pay | Admitting: Orthopedic Surgery

## 2018-04-02 DIAGNOSIS — R197 Diarrhea, unspecified: Secondary | ICD-10-CM | POA: Diagnosis not present

## 2018-04-02 DIAGNOSIS — N179 Acute kidney failure, unspecified: Secondary | ICD-10-CM

## 2018-04-02 DIAGNOSIS — E871 Hypo-osmolality and hyponatremia: Secondary | ICD-10-CM | POA: Diagnosis not present

## 2018-04-02 DIAGNOSIS — E872 Acidosis: Secondary | ICD-10-CM | POA: Diagnosis not present

## 2018-04-02 DIAGNOSIS — D696 Thrombocytopenia, unspecified: Secondary | ICD-10-CM | POA: Diagnosis not present

## 2018-04-02 DIAGNOSIS — R112 Nausea with vomiting, unspecified: Secondary | ICD-10-CM | POA: Diagnosis not present

## 2018-04-02 HISTORY — DX: Acute kidney failure, unspecified: N17.9

## 2018-04-02 NOTE — Pre-Procedure Instructions (Signed)
BRYSUN ESCHMANN  04/02/2018      [facility] - [place], [place] - [address] [place] [address] Phone: [phone] Fax: [phone]    Your procedure is scheduled on Monday July 8.  Report to [facility] Admitting at 8:30 A.M.  Call this number if you have problems the morning of surgery:  [phone]   Remember:  Do not eat or drink after midnight.    Take these medicines the morning of surgery with A SIP OF WATER: Gabapentin (neurontin)  7 days prior to surgery STOP taking any Aspirin(unless otherwise instructed by your surgeon), Aleve, Naproxen, Ibuprofen, Motrin, Advil, Goody's, BC's, all herbal medications, fish oil, and all vitamins     Do not wear jewelry  Do not wear lotions, powders, or colognes, or deodorant.  Do not shave 48 hours prior to surgery.  Men may shave face and neck.  Do not bring valuables to the hospital.  [facility] is not responsible for any belongings or valuables.  Contacts, dentures or bridgework may not be worn into surgery.  Leave your suitcase in the car.  After surgery it may be brought to your room.  For patients admitted to the hospital, discharge time will be determined by your treatment team.  Patients discharged the day of surgery will not be allowed to drive home.   Special instructions:    [place]- Preparing For Surgery  Before surgery, you can play an important role. Because skin is not sterile, your skin needs to be as free of germs as possible. You can reduce the number of germs on your skin by washing with CHG (chlorahexidine gluconate) Soap before surgery.  CHG is an antiseptic cleaner which kills germs and bonds with the skin to continue killing germs even after washing.    Oral Hygiene is also important to reduce your risk of infection.  Remember - BRUSH YOUR TEETH THE MORNING OF SURGERY WITH YOUR REGULAR TOOTHPASTE  Please do  not use if you have an allergy to CHG or antibacterial soaps. If your skin becomes reddened/irritated stop using the CHG.  Do not shave (including legs and underarms) for at least 48 hours prior to first CHG shower. It is OK to shave your face.  Please follow these instructions carefully.   1. Shower the NIGHT BEFORE SURGERY and the MORNING OF SURGERY with CHG.   2. If you chose to wash your hair, wash your hair first as usual with your normal shampoo.  3. After you shampoo, rinse your hair and body thoroughly to remove the shampoo.  4. Use CHG as you would any other liquid soap. You can apply CHG directly to the skin and wash gently with a scrungie or a clean washcloth.   5. Apply the CHG Soap to your body ONLY FROM THE NECK DOWN.  Do not use on open wounds or open sores. Avoid contact with your eyes, ears, mouth and genitals (private parts). Wash Face and genitals (private parts)  with your normal soap.  6. Wash thoroughly, paying special attention to the area where your surgery will be performed.  7. Thoroughly rinse your body with warm water from the neck down.  8. DO NOT shower/wash with your normal soap after using and rinsing off the CHG Soap.  9. Pat yourself dry with a CLEAN TOWEL.  10. Wear CLEAN PAJAMAS to bed the  night before surgery, wear comfortable clothes the morning of surgery  11. Place CLEAN SHEETS on your bed the night of your first shower and DO NOT SLEEP WITH PETS.    Day of Surgery:  Do not apply any deodorants/lotions.  Please wear clean clothes to the hospital/surgery center.   Remember to brush your teeth WITH YOUR REGULAR TOOTHPASTE.    Please read over the following fact sheets that you were given. Coughing and Deep Breathing, Total Joint Packet, MRSA Information and Surgical Site Infection Prevention

## 2018-04-03 ENCOUNTER — Inpatient Hospital Stay (HOSPITAL_COMMUNITY)
Admission: RE | Admit: 2018-04-03 | Discharge: 2018-04-03 | Disposition: A | Discharge status: Home / Self Care | Payer: BLUE CROSS/BLUE SHIELD | Source: Ambulatory Visit

## 2018-04-03 DIAGNOSIS — N179 Acute kidney failure, unspecified: Secondary | ICD-10-CM | POA: Diagnosis not present

## 2018-04-03 DIAGNOSIS — E871 Hypo-osmolality and hyponatremia: Secondary | ICD-10-CM | POA: Diagnosis not present

## 2018-04-03 DIAGNOSIS — R112 Nausea with vomiting, unspecified: Secondary | ICD-10-CM | POA: Diagnosis not present

## 2018-04-03 DIAGNOSIS — E872 Acidosis: Secondary | ICD-10-CM | POA: Diagnosis not present

## 2018-04-04 DIAGNOSIS — N179 Acute kidney failure, unspecified: Secondary | ICD-10-CM | POA: Diagnosis not present

## 2018-04-04 DIAGNOSIS — E876 Hypokalemia: Secondary | ICD-10-CM | POA: Diagnosis not present

## 2018-04-04 DIAGNOSIS — N183 Chronic kidney disease, stage 3 (moderate): Secondary | ICD-10-CM | POA: Diagnosis not present

## 2018-04-04 DIAGNOSIS — E871 Hypo-osmolality and hyponatremia: Secondary | ICD-10-CM | POA: Diagnosis not present

## 2018-04-05 DIAGNOSIS — E785 Hyperlipidemia, unspecified: Secondary | ICD-10-CM | POA: Diagnosis not present

## 2018-04-05 DIAGNOSIS — R112 Nausea with vomiting, unspecified: Secondary | ICD-10-CM | POA: Diagnosis not present

## 2018-04-05 DIAGNOSIS — I10 Essential (primary) hypertension: Secondary | ICD-10-CM | POA: Diagnosis not present

## 2018-04-05 DIAGNOSIS — E871 Hypo-osmolality and hyponatremia: Secondary | ICD-10-CM | POA: Diagnosis not present

## 2018-04-05 DIAGNOSIS — N179 Acute kidney failure, unspecified: Secondary | ICD-10-CM | POA: Diagnosis not present

## 2018-04-05 DIAGNOSIS — E872 Acidosis: Secondary | ICD-10-CM | POA: Diagnosis not present

## 2018-04-09 ENCOUNTER — Inpatient Hospital Stay: Admit: 2018-04-09 | Payer: BLUE CROSS/BLUE SHIELD | Admitting: Orthopedic Surgery

## 2018-04-09 SURGERY — ARTHROPLASTY, HIP, TOTAL, ANTERIOR APPROACH
Anesthesia: Spinal | Site: Hip | Laterality: Right

## 2018-04-16 DIAGNOSIS — D509 Iron deficiency anemia, unspecified: Secondary | ICD-10-CM | POA: Diagnosis not present

## 2018-04-16 DIAGNOSIS — N184 Chronic kidney disease, stage 4 (severe): Secondary | ICD-10-CM | POA: Diagnosis not present

## 2018-04-16 DIAGNOSIS — E559 Vitamin D deficiency, unspecified: Secondary | ICD-10-CM | POA: Diagnosis not present

## 2018-04-16 DIAGNOSIS — Z79899 Other long term (current) drug therapy: Secondary | ICD-10-CM | POA: Diagnosis not present

## 2018-04-18 DIAGNOSIS — E559 Vitamin D deficiency, unspecified: Secondary | ICD-10-CM | POA: Diagnosis not present

## 2018-04-18 DIAGNOSIS — N182 Chronic kidney disease, stage 2 (mild): Secondary | ICD-10-CM | POA: Diagnosis not present

## 2018-04-18 DIAGNOSIS — N179 Acute kidney failure, unspecified: Secondary | ICD-10-CM | POA: Diagnosis not present

## 2018-04-20 ENCOUNTER — Ambulatory Visit: Payer: Self-pay | Admitting: Orthopedic Surgery

## 2018-04-23 DIAGNOSIS — N179 Acute kidney failure, unspecified: Secondary | ICD-10-CM | POA: Diagnosis not present

## 2018-04-23 DIAGNOSIS — E663 Overweight: Secondary | ICD-10-CM | POA: Diagnosis not present

## 2018-04-23 DIAGNOSIS — Z6828 Body mass index (BMI) 28.0-28.9, adult: Secondary | ICD-10-CM | POA: Diagnosis not present

## 2018-04-24 ENCOUNTER — Encounter (HOSPITAL_COMMUNITY): Payer: Self-pay

## 2018-04-24 NOTE — Pre-Procedure Instructions (Signed)
The following are in the chart: Surgical clearance Dr. Hilma Favors 03/29/18 BMP and Hgb, Hct 04/16/18

## 2018-04-24 NOTE — Patient Instructions (Signed)
Your procedure is scheduled on: Thursday, Aug. 1, 2019   Surgery Time:  7:30AM-9:30AM   Report to [facility] Main  Entrance    Report to admitting at 5:30 AM   Call this number if you have problems the morning of surgery [phone]   Do not eat food or drink liquids :After Midnight.   Do NOT smoke after Midnight   Take these medicines the morning of surgery with A SIP OF WATER: Gabapentin, Rosuvastatin                               You may not have any metal on your body including jewelry, and body piercings             Do not wear lotions, powders, perfumes/cologne, or deodorant                          Men may shave face and neck.   Do not bring valuables to the hospital. [place].   Contacts, dentures or bridgework may not be worn into surgery.   Leave suitcase in the car. After surgery it may be brought to your room.   Special Instructions: Bring a copy of your healthcare power of attorney and living will documents         the day of surgery if you haven't scanned them in before.              Please read over the following fact sheets you were given:  [facility] - Preparing for Surgery Before surgery, you can play an important role.  Because skin is not sterile, your skin needs to be as free of germs as possible.  You can reduce the number of germs on your skin by washing with CHG (chlorahexidine gluconate) soap before surgery.  CHG is an antiseptic cleaner which kills germs and bonds with the skin to continue killing germs even after washing. Please DO NOT use if you have an allergy to CHG or antibacterial soaps.  If your skin becomes reddened/irritated stop using the CHG and inform your nurse when you arrive at Short Stay. Do not shave (including legs and underarms) for at least 48 hours prior to the first CHG shower.  You may shave your face/neck.  Please follow these instructions carefully:  1.  Shower  with CHG Soap the night before surgery and the  morning of surgery.  2.  If you choose to wash your hair, wash your hair first as usual with your normal  shampoo.  3.  After you shampoo, rinse your hair and body thoroughly to remove the shampoo.                             4.  Use CHG as you would any other liquid soap.  You can apply chg directly to the skin and wash.  Gently with a scrungie or clean washcloth.  5.  Apply the CHG Soap to your body ONLY FROM THE NECK DOWN.   Do   not use on face/ open                           Wound or open  sores. Avoid contact with eyes, ears mouth and   genitals (private parts).                       Wash face,  Genitals (private parts) with your normal soap.             6.  Wash thoroughly, paying special attention to the area where your    surgery  will be performed.  7.  Thoroughly rinse your body with warm water from the neck down.  8.  DO NOT shower/wash with your normal soap after using and rinsing off the CHG Soap.                9.  Pat yourself dry with a clean towel.            10.  Wear clean pajamas.            11.  Place clean sheets on your bed the night of your first shower and do not  sleep with pets. Day of Surgery : Do not apply any lotions/deodorants the morning of surgery.  Please wear clean clothes to the hospital/surgery center.  FAILURE TO FOLLOW THESE INSTRUCTIONS MAY RESULT IN THE CANCELLATION OF YOUR SURGERY  PATIENT SIGNATURE_________________________________  NURSE SIGNATURE__________________________________  ________________________________________________________________________   Adam Phenix  An incentive spirometer is a tool that can help keep your lungs clear and active. This tool measures how well you are filling your lungs with each breath. Taking long deep breaths may help reverse or decrease the chance of developing breathing (pulmonary) problems (especially infection) following:  A long period of time when  you are unable to move or be active. BEFORE THE PROCEDURE   If the spirometer includes an indicator to show your best effort, your nurse or respiratory therapist will set it to a desired goal.  If possible, sit up straight or lean slightly forward. Try not to slouch.  Hold the incentive spirometer in an upright position. INSTRUCTIONS FOR USE  1. Sit on the edge of your bed if possible, or sit up as far as you can in bed or on a chair. 2. Hold the incentive spirometer in an upright position. 3. Breathe out normally. 4. Place the mouthpiece in your mouth and seal your lips tightly around it. 5. Breathe in slowly and as deeply as possible, raising the piston or the ball toward the top of the column. 6. Hold your breath for 3-5 seconds or for as long as possible. Allow the piston or ball to fall to the bottom of the column. 7. Remove the mouthpiece from your mouth and breathe out normally. 8. Rest for a few seconds and repeat Steps 1 through 7 at least 10 times every 1-2 hours when you are awake. Take your time and take a few normal breaths between deep breaths. 9. The spirometer may include an indicator to show your best effort. Use the indicator as a goal to work toward during each repetition. 10. After each set of 10 deep breaths, practice coughing to be sure your lungs are clear. If you have an incision (the cut made at the time of surgery), support your incision when coughing by placing a pillow or rolled up towels firmly against it. Once you are able to get out of bed, walk around indoors and cough well. You may stop using the incentive spirometer when instructed by your caregiver.  RISKS AND COMPLICATIONS  Take your time so you do  not get dizzy or light-headed.  If you are in pain, you may need to take or ask for pain medication before doing incentive spirometry. It is harder to take a deep breath if you are having pain. AFTER USE  Rest and breathe slowly and easily.  It can be  helpful to keep track of a log of your progress. Your caregiver can provide you with a simple table to help with this. If you are using the spirometer at home, follow these instructions: [place] IF:   You are having difficultly using the spirometer.  You have trouble using the spirometer as often as instructed.  Your pain medication is not giving enough relief while using the spirometer.  You develop fever of 100.5 F (38.1 C) or higher. SEEK IMMEDIATE MEDICAL CARE IF:   You cough up bloody sputum that had not been present before.  You develop fever of 102 F (38.9 C) or greater.  You develop worsening pain at or near the incision site. MAKE SURE YOU:   Understand these instructions.  Will watch your condition.  Will get help right away if you are not doing well or get worse. Document Released: 01/30/2007 Document Revised: 12/12/2011 Document Reviewed: 04/02/2007 Exit[employer] Patient Information 2014 [employer], [place].   ________________________________________________________________________  WHAT IS A BLOOD TRANSFUSION? Blood Transfusion Information  A transfusion is the replacement of blood or some of its parts. Blood is made up of multiple cells which provide different functions.  Red blood cells carry oxygen and are used for blood loss replacement.  White blood cells fight against infection.  Platelets control bleeding.  Plasma helps clot blood.  Other blood products are available for specialized needs, such as hemophilia or other clotting disorders. BEFORE THE TRANSFUSION  Who gives blood for transfusions?   Healthy volunteers who are fully evaluated to make sure their blood is safe. This is blood bank blood. Transfusion therapy is the safest it has ever been in the practice of medicine. Before blood is taken from a donor, a complete history is taken to make sure that person has no history of diseases nor engages in risky social behavior (examples are  intravenous drug use or sexual activity with multiple partners). The donor's travel history is screened to minimize risk of transmitting infections, such as malaria. The donated blood is tested for signs of infectious diseases, such as HIV and hepatitis. The blood is then tested to be sure it is compatible with you in order to minimize the chance of a transfusion reaction. If you or a relative donates blood, this is often done in anticipation of surgery and is not appropriate for emergency situations. It takes many days to process the donated blood. RISKS AND COMPLICATIONS Although transfusion therapy is very safe and saves many lives, the main dangers of transfusion include:   Getting an infectious disease.  Developing a transfusion reaction. This is an allergic reaction to something in the blood you were given. Every precaution is taken to prevent this. The decision to have a blood transfusion has been considered carefully by your caregiver before blood is given. Blood is not given unless the benefits outweigh the risks. AFTER THE TRANSFUSION  Right after receiving a blood transfusion, you will usually feel much better and more energetic. This is especially true if your red blood cells have gotten low (anemic). The transfusion raises the level of the red blood cells which carry oxygen, and this usually causes an energy increase.  The nurse administering the  transfusion will monitor you carefully for complications. HOME CARE INSTRUCTIONS  No special instructions are needed after a transfusion. You may find your energy is better. Speak with your caregiver about any limitations on activity for underlying diseases you may have. SEEK MEDICAL CARE IF:   Your condition is not improving after your transfusion.  You develop redness or irritation at the intravenous (IV) site. SEEK IMMEDIATE MEDICAL CARE IF:  Any of the following symptoms occur over the next 12 hours:  Shaking chills.  You have a  temperature by mouth above 102 F (38.9 C), not controlled by medicine.  Chest, back, or muscle pain.  People around you feel you are not acting correctly or are confused.  Shortness of breath or difficulty breathing.  Dizziness and fainting.  You get a rash or develop hives.  You have a decrease in urine output.  Your urine turns a dark color or changes to pink, red, or brown. Any of the following symptoms occur over the next 10 days:  You have a temperature by mouth above 102 F (38.9 C), not controlled by medicine.  Shortness of breath.  Weakness after normal activity.  The white part of the eye turns yellow (jaundice).  You have a decrease in the amount of urine or are urinating less often.  Your urine turns a dark color or changes to pink, red, or brown. Document Released: 09/16/2000 Document Revised: 12/12/2011 Document Reviewed: 05/05/2008 [facility] Patient Information 2014 [place], [place].  _______________________________________________________________________

## 2018-04-25 ENCOUNTER — Ambulatory Visit: Payer: Self-pay | Admitting: Orthopedic Surgery

## 2018-04-25 NOTE — H&P (Signed)
TOTAL HIP ADMISSION H&P  Patient is admitted for right total hip arthroplasty.  Subjective:  Chief Complaint: right hip pain  HPI: [name] [name], 62 y.o. male, has a history of pain and functional disability in the right hip(s) due to AVN and patient has failed non-surgical conservative treatments for greater than 12 weeks to include NSAID's and/or analgesics, flexibility and strengthening excercises, use of assistive devices, weight reduction as appropriate and activity modification.  Onset of symptoms was gradual starting 3 years ago with rapidlly worsening course since that time.The patient noted no past surgery on the right hip(s).  Patient currently rates pain in the right hip at 10 out of 10 with activity. Patient has night pain, worsening of pain with activity and weight bearing, trendelenberg gait, pain that interfers with activities of daily living, pain with passive range of motion and joint swelling. Patient has evidence of subchondral cysts, subchondral sclerosis, periarticular osteophytes and joint space narrowing by imaging studies. This condition presents safety issues increasing the risk of falls. This patient has had avascular necrosis of the hip.  There is no current active infection.  Patient Active Problem List   Diagnosis Date Noted  . Tobacco abuse 12/12/2011  . Cellulitis of second finger of right hand 05/18/2011  . Cellulitis and abscess of finger 05/18/2011  . Cellulitis of hand 05/18/2011  . Cellulitis of index finger 05/18/2011  . Snake bite poisoning 05/17/2011  . Swelling of second finger of right hand 05/17/2011  . Pain in finger of right hand 05/17/2011  . HTN (hypertension) 05/17/2011  . Hyperlipidemia 05/17/2011  . FATTY LIVER DISEASE 12/04/2008  . ABDOMINAL PAIN-RUQ 12/04/2008   Past Medical History:  Diagnosis Date  . CKD (chronic kidney disease), stage II   . Complication of anesthesia   . History of venomous snake bite    rt index finger-  .  Hypercholesterolemia   . Hypertension   . PONV (postoperative nausea and vomiting)   . Spinal stenosis   . Tobacco abuse     Past Surgical History:  Procedure Laterality Date  . COLONOSCOPY    . FINGER EXPLORATION  8/12   rt index finger snakebite  . HERNIA REPAIR     as a child  . I&D EXTREMITY  11/24/2011   Procedure: IRRIGATION AND DEBRIDEMENT EXTREMITY;  Surgeon: Tennis Must, MD;  Location: [facility];  Service: Orthopedics;  Laterality: Right;  right index  . MASS EXCISION  01/23/2012   Procedure: EXCISION MASS;  Surgeon: Tennis Must, MD;  Location: [facility];  Service: Orthopedics;  Laterality: Right;  Right index a-cell graft    Current Outpatient Medications  Medication Sig Dispense Refill Last Dose  . aspirin 325 MG EC tablet Take 325-650 mg by mouth daily as needed for pain.    Taking  . gabapentin (NEURONTIN) 300 MG capsule Take 300 mg by mouth 3 (three) times daily.   Taking  . Multiple Vitamins-Minerals (CENTRUM ULTRA MENS) TABS Take 1 tablet by mouth daily.     Taking  . olmesartan-hydrochlorothiazide (BENICAR HCT) 40-25 MG per tablet Take 1 tablet by mouth daily.     Taking  . Omega-3 Fatty Acids (FISH OIL) 1000 MG CAPS Take 1 capsule by mouth daily.     Taking  . rosuvastatin (CRESTOR) 10 MG tablet Take 10 mg by mouth daily.    Taking   No current facility-administered medications for this visit.    Allergies  Allergen Reactions  .  Codeine Other (See Comments)    "Gives me a headache for days."    Social History   Tobacco Use  . Smoking status: Current Every Day Smoker    Packs/day: 1.00    Years: 20.00    Pack years: 20.00    Types: Cigarettes  . Smokeless tobacco: Never Used  Substance Use Topics  . Alcohol use: Yes    Comment: drinks a fifth in a week sometimes    Family History  Problem Relation Age of Onset  . Diabetes Mother      Review of Systems  Constitutional: Negative.   HENT: Negative.   Eyes:  Negative.   Respiratory: Negative.   Cardiovascular: Negative.   Gastrointestinal: Positive for diarrhea, nausea and vomiting.  Musculoskeletal: Positive for back pain and joint pain.  Skin: Negative.   Neurological: Negative.   Endo/Heme/Allergies: Negative.   Psychiatric/Behavioral: Negative.     Objective:  Physical Exam  Vitals reviewed. Constitutional: He is oriented to person, place, and time. He appears well-developed and well-nourished.  HENT:  Head: Normocephalic and atraumatic.  Eyes: Pupils are equal, round, and reactive to light. Conjunctivae and EOM are normal.  Neck: Normal range of motion. Neck supple.  Cardiovascular: Normal rate, regular rhythm and intact distal pulses.  Respiratory: Effort normal. No respiratory distress.  GI: Soft. He exhibits no distension.  Genitourinary:  Genitourinary Comments: deferred  Musculoskeletal:       Right hip: He exhibits decreased range of motion and bony tenderness.  Neurological: He is alert and oriented to person, place, and time. He has normal reflexes.  Skin: Skin is warm and dry.  Psychiatric: He has a normal mood and affect. His behavior is normal. Judgment and thought content normal.    Vital signs in last 24 hours: @VSRANGES @  Labs:   Estimated body mass index is 28.74 kg/m as calculated from the following:   Height as of 03/12/18: 5\' 8"  (1.727 m).   Weight as of 03/12/18: 85.7 kg (189 lb).   Imaging Review Plain radiographs demonstrate severe degenerative joint disease of the right hip(s). The bone quality appears to be satisfactory for age and reported activity level.    Preoperative templating of the joint replacement has been completed, documented, and submitted to the Operating Room personnel in order to optimize intra-operative equipment management.     Assessment/Plan:  AVN, right hip(s)  The patient history, physical examination, clinical judgement of the provider and imaging studies are  consistent with end stage degenerative joint disease of the right hip(s) and total hip arthroplasty is deemed medically necessary. The treatment options including medical management, injection therapy, arthroscopy and arthroplasty were discussed at length. The risks and benefits of total hip arthroplasty were presented and reviewed. The risks due to aseptic loosening, infection, stiffness, dislocation/subluxation,  thromboembolic complications and other imponderables were discussed.  The patient acknowledged the explanation, agreed to proceed with the plan and consent was signed. Patient is being admitted for inpatient treatment for surgery, pain control, PT, OT, prophylactic antibiotics, VTE prophylaxis, progressive ambulation and ADL's and discharge planning.The patient is planning to be discharged home with HEP. Has RW.

## 2018-04-25 NOTE — H&P (View-Only) (Signed)
TOTAL HIP ADMISSION H&P  Patient is admitted for right total hip arthroplasty.  Subjective:  Chief Complaint: right hip pain  HPI: [name] [name], 62 y.o. male, has a history of pain and functional disability in the right hip(s) due to AVN and patient has failed non-surgical conservative treatments for greater than 12 weeks to include NSAID's and/or analgesics, flexibility and strengthening excercises, use of assistive devices, weight reduction as appropriate and activity modification.  Onset of symptoms was gradual starting 3 years ago with rapidlly worsening course since that time.The patient noted no past surgery on the right hip(s).  Patient currently rates pain in the right hip at 10 out of 10 with activity. Patient has night pain, worsening of pain with activity and weight bearing, trendelenberg gait, pain that interfers with activities of daily living, pain with passive range of motion and joint swelling. Patient has evidence of subchondral cysts, subchondral sclerosis, periarticular osteophytes and joint space narrowing by imaging studies. This condition presents safety issues increasing the risk of falls. This patient has had avascular necrosis of the hip.  There is no current active infection.  Patient Active Problem List   Diagnosis Date Noted  . Tobacco abuse 12/12/2011  . Cellulitis of second finger of right hand 05/18/2011  . Cellulitis and abscess of finger 05/18/2011  . Cellulitis of hand 05/18/2011  . Cellulitis of index finger 05/18/2011  . Snake bite poisoning 05/17/2011  . Swelling of second finger of right hand 05/17/2011  . Pain in finger of right hand 05/17/2011  . HTN (hypertension) 05/17/2011  . Hyperlipidemia 05/17/2011  . FATTY LIVER DISEASE 12/04/2008  . ABDOMINAL PAIN-RUQ 12/04/2008   Past Medical History:  Diagnosis Date  . CKD (chronic kidney disease), stage II   . Complication of anesthesia   . History of venomous snake bite    rt index finger-  .  Hypercholesterolemia   . Hypertension   . PONV (postoperative nausea and vomiting)   . Spinal stenosis   . Tobacco abuse     Past Surgical History:  Procedure Laterality Date  . COLONOSCOPY    . FINGER EXPLORATION  8/12   rt index finger snakebite  . HERNIA REPAIR     as a child  . I&D EXTREMITY  11/24/2011   Procedure: IRRIGATION AND DEBRIDEMENT EXTREMITY;  Surgeon: Tennis Must, MD;  Location: [facility];  Service: Orthopedics;  Laterality: Right;  right index  . MASS EXCISION  01/23/2012   Procedure: EXCISION MASS;  Surgeon: Tennis Must, MD;  Location: [facility];  Service: Orthopedics;  Laterality: Right;  Right index a-cell graft    Current Outpatient Medications  Medication Sig Dispense Refill Last Dose  . aspirin 325 MG EC tablet Take 325-650 mg by mouth daily as needed for pain.    Taking  . gabapentin (NEURONTIN) 300 MG capsule Take 300 mg by mouth 3 (three) times daily.   Taking  . Multiple Vitamins-Minerals (CENTRUM ULTRA MENS) TABS Take 1 tablet by mouth daily.     Taking  . olmesartan-hydrochlorothiazide (BENICAR HCT) 40-25 MG per tablet Take 1 tablet by mouth daily.     Taking  . Omega-3 Fatty Acids (FISH OIL) 1000 MG CAPS Take 1 capsule by mouth daily.     Taking  . rosuvastatin (CRESTOR) 10 MG tablet Take 10 mg by mouth daily.    Taking   No current facility-administered medications for this visit.    Allergies  Allergen Reactions  .  Codeine Other (See Comments)    "Gives me a headache for days."    Social History   Tobacco Use  . Smoking status: Current Every Day Smoker    Packs/day: 1.00    Years: 20.00    Pack years: 20.00    Types: Cigarettes  . Smokeless tobacco: Never Used  Substance Use Topics  . Alcohol use: Yes    Comment: drinks a fifth in a week sometimes    Family History  Problem Relation Age of Onset  . Diabetes Mother      Review of Systems  Constitutional: Negative.   HENT: Negative.   Eyes:  Negative.   Respiratory: Negative.   Cardiovascular: Negative.   Gastrointestinal: Positive for diarrhea, nausea and vomiting.  Musculoskeletal: Positive for back pain and joint pain.  Skin: Negative.   Neurological: Negative.   Endo/Heme/Allergies: Negative.   Psychiatric/Behavioral: Negative.     Objective:  Physical Exam  Vitals reviewed. Constitutional: He is oriented to person, place, and time. He appears well-developed and well-nourished.  HENT:  Head: Normocephalic and atraumatic.  Eyes: Pupils are equal, round, and reactive to light. Conjunctivae and EOM are normal.  Neck: Normal range of motion. Neck supple.  Cardiovascular: Normal rate, regular rhythm and intact distal pulses.  Respiratory: Effort normal. No respiratory distress.  GI: Soft. He exhibits no distension.  Genitourinary:  Genitourinary Comments: deferred  Musculoskeletal:       Right hip: He exhibits decreased range of motion and bony tenderness.  Neurological: He is alert and oriented to person, place, and time. He has normal reflexes.  Skin: Skin is warm and dry.  Psychiatric: He has a normal mood and affect. His behavior is normal. Judgment and thought content normal.    Vital signs in last 24 hours: @VSRANGES @  Labs:   Estimated body mass index is 28.74 kg/m as calculated from the following:   Height as of 03/12/18: 5\' 8"  (1.727 m).   Weight as of 03/12/18: 85.7 kg (189 lb).   Imaging Review Plain radiographs demonstrate severe degenerative joint disease of the right hip(s). The bone quality appears to be satisfactory for age and reported activity level.    Preoperative templating of the joint replacement has been completed, documented, and submitted to the Operating Room personnel in order to optimize intra-operative equipment management.     Assessment/Plan:  AVN, right hip(s)  The patient history, physical examination, clinical judgement of the provider and imaging studies are  consistent with end stage degenerative joint disease of the right hip(s) and total hip arthroplasty is deemed medically necessary. The treatment options including medical management, injection therapy, arthroscopy and arthroplasty were discussed at length. The risks and benefits of total hip arthroplasty were presented and reviewed. The risks due to aseptic loosening, infection, stiffness, dislocation/subluxation,  thromboembolic complications and other imponderables were discussed.  The patient acknowledged the explanation, agreed to proceed with the plan and consent was signed. Patient is being admitted for inpatient treatment for surgery, pain control, PT, OT, prophylactic antibiotics, VTE prophylaxis, progressive ambulation and ADL's and discharge planning.The patient is planning to be discharged home with HEP. Has RW.

## 2018-04-26 ENCOUNTER — Encounter (HOSPITAL_COMMUNITY)
Admission: RE | Admit: 2018-04-26 | Discharge: 2018-04-26 | Disposition: A | Discharge status: Home / Self Care | Payer: BLUE CROSS/BLUE SHIELD | Source: Ambulatory Visit | Attending: Orthopedic Surgery | Admitting: Orthopedic Surgery

## 2018-04-26 ENCOUNTER — Other Ambulatory Visit: Payer: Self-pay

## 2018-04-26 ENCOUNTER — Encounter (INDEPENDENT_AMBULATORY_CARE_PROVIDER_SITE_OTHER): Payer: Self-pay

## 2018-04-26 ENCOUNTER — Encounter (HOSPITAL_COMMUNITY): Payer: Self-pay

## 2018-04-26 DIAGNOSIS — Z01812 Encounter for preprocedural laboratory examination: Secondary | ICD-10-CM | POA: Diagnosis not present

## 2018-04-26 DIAGNOSIS — M879 Osteonecrosis, unspecified: Secondary | ICD-10-CM | POA: Diagnosis not present

## 2018-04-26 HISTORY — DX: Acute kidney failure, unspecified: N17.9

## 2018-04-26 HISTORY — DX: Personal history of other diseases of the digestive system: Z87.19

## 2018-04-26 HISTORY — DX: Chronic kidney disease, stage 2 (mild): N18.2

## 2018-04-26 HISTORY — DX: Other specified postprocedural states: Z98.890

## 2018-04-26 HISTORY — DX: Other intervertebral disc degeneration, lumbar region: M51.36

## 2018-04-26 HISTORY — DX: Other intervertebral disc degeneration, lumbar region without mention of lumbar back pain or lower extremity pain: M51.369

## 2018-04-26 HISTORY — DX: Neuralgia and neuritis, unspecified: M79.2

## 2018-04-26 LAB — SURGICAL PCR SCREEN
MRSA, PCR: NEGATIVE
Staphylococcus aureus: NEGATIVE

## 2018-04-26 LAB — ABO/RH: ABO/RH(D): A POS

## 2018-04-27 NOTE — Pre-Procedure Instructions (Signed)
ECHO 04/02/18 in chart

## 2018-05-02 NOTE — Anesthesia Preprocedure Evaluation (Addendum)
Anesthesia Evaluation  Patient identified by MRN, date of birth, ID band Patient awake    Reviewed: Allergy & Precautions, NPO status , Patient's Chart, lab work & pertinent test results  History of Anesthesia Complications (+) PONV  Airway Mallampati: II  TM Distance: >3 FB Neck ROM: Full    Dental  (+) Teeth Intact, Dental Advisory Given   Pulmonary Current Smoker,    breath sounds clear to auscultation       Cardiovascular hypertension, Pt. on medications  Rhythm:Regular Rate:Normal     Neuro/Psych  Spinal stenosis Spinal cord stimulator  negative psych ROS   GI/Hepatic negative GI ROS, Neg liver ROS,   Endo/Other  negative endocrine ROS  Renal/GU CRFRenal disease  negative genitourinary   Musculoskeletal  (+) Arthritis ,   Abdominal   Peds  Hematology negative hematology ROS (+)   Anesthesia Other Findings   Reproductive/Obstetrics                           Anesthesia Physical Anesthesia Plan  ASA: II  Anesthesia Plan: General   Post-op Pain Management:    Induction:   PONV Risk Score and Plan: 3 and Propofol infusion, Dexamethasone, Ondansetron, Midazolam and Treatment may vary due to age or medical condition  Airway Management Planned: Oral ETT  Additional Equipment: None  Intra-op Plan:   Post-operative Plan: Extubation in OR  Informed Consent: I have reviewed the patients History and Physical, chart, labs and discussed the procedure including the risks, benefits and alternatives for the proposed anesthesia with the patient or authorized representative who has indicated his/her understanding and acceptance.   Dental advisory given  Plan Discussed with: CRNA and Anesthesiologist  Anesthesia Plan Comments: (Discussed spinal with patient. After going over risks and benefits, patient was adamant he preferred general anesthesia.)     Anesthesia Quick  Evaluation

## 2018-05-03 ENCOUNTER — Encounter (HOSPITAL_COMMUNITY): Payer: Self-pay | Admitting: *Deleted

## 2018-05-03 ENCOUNTER — Encounter (HOSPITAL_COMMUNITY)
Admission: RE | Disposition: A | Discharge status: Home / Self Care | Payer: Self-pay | Source: Ambulatory Visit | Attending: Orthopedic Surgery

## 2018-05-03 ENCOUNTER — Inpatient Hospital Stay (HOSPITAL_COMMUNITY): Payer: BLUE CROSS/BLUE SHIELD

## 2018-05-03 ENCOUNTER — Inpatient Hospital Stay (HOSPITAL_COMMUNITY)
Admission: RE | Admit: 2018-05-03 | Discharge: 2018-05-04 | DRG: 470 | Disposition: A | Discharge status: Home / Self Care | Payer: BLUE CROSS/BLUE SHIELD | Source: Ambulatory Visit | Attending: Orthopedic Surgery | Admitting: Orthopedic Surgery

## 2018-05-03 ENCOUNTER — Inpatient Hospital Stay (HOSPITAL_COMMUNITY): Payer: BLUE CROSS/BLUE SHIELD | Admitting: Anesthesiology

## 2018-05-03 ENCOUNTER — Other Ambulatory Visit: Payer: Self-pay

## 2018-05-03 DIAGNOSIS — I129 Hypertensive chronic kidney disease with stage 1 through stage 4 chronic kidney disease, or unspecified chronic kidney disease: Secondary | ICD-10-CM | POA: Diagnosis not present

## 2018-05-03 DIAGNOSIS — M1611 Unilateral primary osteoarthritis, right hip: Secondary | ICD-10-CM | POA: Diagnosis not present

## 2018-05-03 DIAGNOSIS — N182 Chronic kidney disease, stage 2 (mild): Secondary | ICD-10-CM | POA: Diagnosis present

## 2018-05-03 DIAGNOSIS — M87851 Other osteonecrosis, right femur: Secondary | ICD-10-CM | POA: Diagnosis not present

## 2018-05-03 DIAGNOSIS — E785 Hyperlipidemia, unspecified: Secondary | ICD-10-CM | POA: Diagnosis not present

## 2018-05-03 DIAGNOSIS — Z09 Encounter for follow-up examination after completed treatment for conditions other than malignant neoplasm: Secondary | ICD-10-CM

## 2018-05-03 DIAGNOSIS — M87051 Idiopathic aseptic necrosis of right femur: Secondary | ICD-10-CM | POA: Diagnosis present

## 2018-05-03 DIAGNOSIS — Z9181 History of falling: Secondary | ICD-10-CM

## 2018-05-03 DIAGNOSIS — Z7982 Long term (current) use of aspirin: Secondary | ICD-10-CM | POA: Diagnosis not present

## 2018-05-03 DIAGNOSIS — Z96641 Presence of right artificial hip joint: Secondary | ICD-10-CM | POA: Diagnosis not present

## 2018-05-03 DIAGNOSIS — F17210 Nicotine dependence, cigarettes, uncomplicated: Secondary | ICD-10-CM | POA: Diagnosis present

## 2018-05-03 DIAGNOSIS — Z885 Allergy status to narcotic agent status: Secondary | ICD-10-CM | POA: Diagnosis not present

## 2018-05-03 DIAGNOSIS — Z471 Aftercare following joint replacement surgery: Secondary | ICD-10-CM | POA: Diagnosis not present

## 2018-05-03 DIAGNOSIS — Z79899 Other long term (current) drug therapy: Secondary | ICD-10-CM | POA: Diagnosis not present

## 2018-05-03 DIAGNOSIS — I10 Essential (primary) hypertension: Secondary | ICD-10-CM | POA: Diagnosis not present

## 2018-05-03 HISTORY — PX: TOTAL HIP ARTHROPLASTY: SHX124

## 2018-05-03 LAB — TYPE AND SCREEN
ABO/RH(D): A POS
ANTIBODY SCREEN: NEGATIVE

## 2018-05-03 SURGERY — ARTHROPLASTY, HIP, TOTAL, ANTERIOR APPROACH
Anesthesia: Spinal | Site: Hip | Laterality: Right

## 2018-05-03 MED ORDER — DEXAMETHASONE SODIUM PHOSPHATE 10 MG/ML IJ SOLN
INTRAMUSCULAR | Status: DC | PRN
  Administered 2018-05-03: 10 mg via INTRAVENOUS

## 2018-05-03 MED ORDER — SODIUM CHLORIDE 0.9 % IV SOLN: INTRAVENOUS | Status: DC

## 2018-05-03 MED ORDER — SODIUM CHLORIDE 0.9 % IJ SOLN
INTRAMUSCULAR | Status: DC | PRN
  Administered 2018-05-03: 30 mL

## 2018-05-03 MED ORDER — METHOCARBAMOL 500 MG IVPB - SIMPLE MED
INTRAVENOUS | Status: AC
  Filled 2018-05-03: qty 50

## 2018-05-03 MED ORDER — SODIUM CHLORIDE 0.9 % IV SOLN
INTRAVENOUS | Status: DC
  Administered 2018-05-03 (×2): via INTRAVENOUS

## 2018-05-03 MED ORDER — MORPHINE SULFATE (PF) 2 MG/ML IV SOLN: 0.5000 mg | INTRAVENOUS | Status: DC | PRN

## 2018-05-03 MED ORDER — FENTANYL CITRATE (PF) 100 MCG/2ML IJ SOLN
INTRAMUSCULAR | Status: AC
  Administered 2018-05-03: 25 ug via INTRAVENOUS
  Filled 2018-05-03: qty 2

## 2018-05-03 MED ORDER — SENNA 8.6 MG PO TABS
1.0000 | ORAL_TABLET | Freq: Two times a day (BID) | ORAL | Status: DC
  Administered 2018-05-03 – 2018-05-04 (×2): 8.6 mg via ORAL
  Filled 2018-05-03 (×2): qty 1

## 2018-05-03 MED ORDER — ALBUMIN HUMAN 5 % IV SOLN
12.5000 g | Freq: Once | INTRAVENOUS | Status: AC
  Administered 2018-05-03: 12.5 g via INTRAVENOUS

## 2018-05-03 MED ORDER — OXYCODONE HCL 5 MG/5ML PO SOLN
5.0000 mg | Freq: Once | ORAL | Status: DC | PRN
  Filled 2018-05-03: qty 5

## 2018-05-03 MED ORDER — CHLORHEXIDINE GLUCONATE 4 % EX LIQD: 60.0000 mL | Freq: Once | CUTANEOUS | Status: DC

## 2018-05-03 MED ORDER — HYDROCHLOROTHIAZIDE 25 MG PO TABS
25.0000 mg | ORAL_TABLET | Freq: Every day | ORAL | Status: DC
  Administered 2018-05-03: 25 mg via ORAL
  Filled 2018-05-03 (×2): qty 1

## 2018-05-03 MED ORDER — KETOROLAC TROMETHAMINE 15 MG/ML IJ SOLN
15.0000 mg | Freq: Four times a day (QID) | INTRAMUSCULAR | Status: DC
  Administered 2018-05-03 – 2018-05-04 (×3): 15 mg via INTRAVENOUS
  Filled 2018-05-03 (×3): qty 1

## 2018-05-03 MED ORDER — FENTANYL CITRATE (PF) 100 MCG/2ML IJ SOLN
INTRAMUSCULAR | Status: AC
  Filled 2018-05-03: qty 2

## 2018-05-03 MED ORDER — PROMETHAZINE HCL 25 MG/ML IJ SOLN: 6.2500 mg | INTRAMUSCULAR | Status: DC | PRN

## 2018-05-03 MED ORDER — PHENOL 1.4 % MT LIQD: 1.0000 | OROMUCOSAL | Status: DC | PRN

## 2018-05-03 MED ORDER — SUGAMMADEX SODIUM 200 MG/2ML IV SOLN
INTRAVENOUS | Status: DC | PRN
  Administered 2018-05-03: 200 mg via INTRAVENOUS

## 2018-05-03 MED ORDER — HYDROCODONE-ACETAMINOPHEN 5-325 MG PO TABS
1.0000 | ORAL_TABLET | ORAL | Status: DC | PRN
  Administered 2018-05-03: 1 via ORAL
  Administered 2018-05-03 – 2018-05-04 (×3): 2 via ORAL
  Filled 2018-05-03 (×2): qty 2
  Filled 2018-05-03: qty 1
  Filled 2018-05-03: qty 2

## 2018-05-03 MED ORDER — OXYCODONE HCL 5 MG PO TABS: 5.0000 mg | ORAL_TABLET | Freq: Once | ORAL | Status: DC | PRN

## 2018-05-03 MED ORDER — GABAPENTIN 300 MG PO CAPS
300.0000 mg | ORAL_CAPSULE | Freq: Three times a day (TID) | ORAL | Status: DC
  Administered 2018-05-03 – 2018-05-04 (×3): 300 mg via ORAL
  Filled 2018-05-03 (×3): qty 1

## 2018-05-03 MED ORDER — LIDOCAINE 2% (20 MG/ML) 5 ML SYRINGE
INTRAMUSCULAR | Status: AC
  Filled 2018-05-03: qty 5

## 2018-05-03 MED ORDER — ROSUVASTATIN CALCIUM 10 MG PO TABS
10.0000 mg | ORAL_TABLET | Freq: Every day | ORAL | Status: DC
  Administered 2018-05-03 – 2018-05-04 (×2): 10 mg via ORAL
  Filled 2018-05-03 (×2): qty 1

## 2018-05-03 MED ORDER — MIDAZOLAM HCL 2 MG/2ML IJ SOLN
INTRAMUSCULAR | Status: AC
  Filled 2018-05-03: qty 2

## 2018-05-03 MED ORDER — MIDAZOLAM HCL 2 MG/2ML IJ SOLN
INTRAMUSCULAR | Status: DC | PRN
  Administered 2018-05-03: 2 mg via INTRAVENOUS

## 2018-05-03 MED ORDER — METHOCARBAMOL 500 MG IVPB - SIMPLE MED
500.0000 mg | Freq: Four times a day (QID) | INTRAVENOUS | Status: DC | PRN
  Administered 2018-05-03: 500 mg via INTRAVENOUS
  Filled 2018-05-03: qty 50

## 2018-05-03 MED ORDER — SODIUM CHLORIDE 0.9 % IR SOLN
Status: DC | PRN
  Administered 2018-05-03: 3000 mL
  Administered 2018-05-03: 1000 mL

## 2018-05-03 MED ORDER — ONDANSETRON HCL 4 MG/2ML IJ SOLN: 4.0000 mg | Freq: Four times a day (QID) | INTRAMUSCULAR | Status: DC | PRN

## 2018-05-03 MED ORDER — ALUM & MAG HYDROXIDE-SIMETH 200-200-20 MG/5ML PO SUSP
30.0000 mL | ORAL | Status: DC | PRN
Start: 2018-05-03 — End: 2018-05-04

## 2018-05-03 MED ORDER — METOCLOPRAMIDE HCL 5 MG/ML IJ SOLN: 5.0000 mg | Freq: Three times a day (TID) | INTRAMUSCULAR | Status: DC | PRN

## 2018-05-03 MED ORDER — ASPIRIN 81 MG PO CHEW
81.0000 mg | CHEWABLE_TABLET | Freq: Two times a day (BID) | ORAL | Status: DC
  Administered 2018-05-03 – 2018-05-04 (×2): 81 mg via ORAL
  Filled 2018-05-03 (×2): qty 1

## 2018-05-03 MED ORDER — LIDOCAINE HCL (CARDIAC) PF 100 MG/5ML IV SOSY
PREFILLED_SYRINGE | INTRAVENOUS | Status: DC | PRN
  Administered 2018-05-03: 100 mg via INTRATRACHEAL

## 2018-05-03 MED ORDER — PROPOFOL 10 MG/ML IV BOLUS
INTRAVENOUS | Status: DC | PRN
  Administered 2018-05-03: 150 mg via INTRAVENOUS

## 2018-05-03 MED ORDER — METOCLOPRAMIDE HCL 5 MG PO TABS: 5.0000 mg | ORAL_TABLET | Freq: Three times a day (TID) | ORAL | Status: DC | PRN

## 2018-05-03 MED ORDER — DEXAMETHASONE SODIUM PHOSPHATE 10 MG/ML IJ SOLN
INTRAMUSCULAR | Status: AC
  Filled 2018-05-03: qty 1

## 2018-05-03 MED ORDER — CEFAZOLIN SODIUM-DEXTROSE 2-4 GM/100ML-% IV SOLN
2.0000 g | Freq: Four times a day (QID) | INTRAVENOUS | Status: AC
  Administered 2018-05-03 (×2): 2 g via INTRAVENOUS
  Filled 2018-05-03 (×2): qty 100

## 2018-05-03 MED ORDER — METHOCARBAMOL 500 MG PO TABS: 500.0000 mg | ORAL_TABLET | Freq: Four times a day (QID) | ORAL | Status: DC | PRN

## 2018-05-03 MED ORDER — POVIDONE-IODINE 10 % EX SWAB
2.0000 "application " | Freq: Once | CUTANEOUS | Status: AC
  Administered 2018-05-03: 2 via TOPICAL

## 2018-05-03 MED ORDER — EPHEDRINE SULFATE-NACL 50-0.9 MG/10ML-% IV SOSY
PREFILLED_SYRINGE | INTRAVENOUS | Status: DC | PRN
  Administered 2018-05-03 (×3): 10 mg via INTRAVENOUS

## 2018-05-03 MED ORDER — POLYETHYLENE GLYCOL 3350 17 G PO PACK: 17.0000 g | PACK | Freq: Every day | ORAL | Status: DC | PRN

## 2018-05-03 MED ORDER — ACETAMINOPHEN 10 MG/ML IV SOLN
1000.0000 mg | INTRAVENOUS | Status: AC
  Administered 2018-05-03: 1000 mg via INTRAVENOUS
  Filled 2018-05-03: qty 100

## 2018-05-03 MED ORDER — SODIUM CHLORIDE 0.9 % IJ SOLN
INTRAMUSCULAR | Status: AC
  Filled 2018-05-03: qty 50

## 2018-05-03 MED ORDER — CEFAZOLIN SODIUM-DEXTROSE 2-4 GM/100ML-% IV SOLN
2.0000 g | INTRAVENOUS | Status: AC
  Administered 2018-05-03: 2 g via INTRAVENOUS
  Filled 2018-05-03: qty 100

## 2018-05-03 MED ORDER — ISOPROPYL ALCOHOL 70 % SOLN
Status: AC
  Filled 2018-05-03: qty 480

## 2018-05-03 MED ORDER — ONDANSETRON HCL 4 MG PO TABS: 4.0000 mg | ORAL_TABLET | Freq: Four times a day (QID) | ORAL | Status: DC | PRN

## 2018-05-03 MED ORDER — PROPOFOL 500 MG/50ML IV EMUL
INTRAVENOUS | Status: DC | PRN
  Administered 2018-05-03: 10 ug/kg/min via INTRAVENOUS

## 2018-05-03 MED ORDER — IRBESARTAN 150 MG PO TABS
300.0000 mg | ORAL_TABLET | Freq: Every day | ORAL | Status: DC
  Administered 2018-05-03: 300 mg via ORAL
  Filled 2018-05-03 (×2): qty 2

## 2018-05-03 MED ORDER — SODIUM CHLORIDE 0.9 % IV SOLN
1000.0000 mg | INTRAVENOUS | Status: AC
  Administered 2018-05-03: 1000 mg via INTRAVENOUS
  Filled 2018-05-03: qty 10

## 2018-05-03 MED ORDER — DEXAMETHASONE SODIUM PHOSPHATE 10 MG/ML IJ SOLN
10.0000 mg | Freq: Once | INTRAMUSCULAR | Status: AC
  Administered 2018-05-04: 10 mg via INTRAVENOUS
  Filled 2018-05-03: qty 1

## 2018-05-03 MED ORDER — ALBUMIN HUMAN 5 % IV SOLN
INTRAVENOUS | Status: AC
  Filled 2018-05-03: qty 250

## 2018-05-03 MED ORDER — ROCURONIUM BROMIDE 10 MG/ML (PF) SYRINGE
PREFILLED_SYRINGE | INTRAVENOUS | Status: DC | PRN
  Administered 2018-05-03: 20 mg via INTRAVENOUS
  Administered 2018-05-03: 50 mg via INTRAVENOUS
  Administered 2018-05-03: 10 mg via INTRAVENOUS

## 2018-05-03 MED ORDER — BUPIVACAINE-EPINEPHRINE 0.25% -1:200000 IJ SOLN
INTRAMUSCULAR | Status: DC | PRN
  Administered 2018-05-03: 30 mL

## 2018-05-03 MED ORDER — DIPHENHYDRAMINE HCL 12.5 MG/5ML PO ELIX: 12.5000 mg | ORAL_SOLUTION | ORAL | Status: DC | PRN

## 2018-05-03 MED ORDER — LACTATED RINGERS IV SOLN
INTRAVENOUS | Status: DC
  Administered 2018-05-03 (×2): via INTRAVENOUS

## 2018-05-03 MED ORDER — MENTHOL 3 MG MT LOZG: 1.0000 | LOZENGE | OROMUCOSAL | Status: DC | PRN

## 2018-05-03 MED ORDER — ONDANSETRON HCL 4 MG/2ML IJ SOLN
INTRAMUSCULAR | Status: DC | PRN
  Administered 2018-05-03: 4 mg via INTRAVENOUS

## 2018-05-03 MED ORDER — FENTANYL CITRATE (PF) 250 MCG/5ML IJ SOLN
INTRAMUSCULAR | Status: DC | PRN
  Administered 2018-05-03: 50 ug via INTRAVENOUS
  Administered 2018-05-03: 100 ug via INTRAVENOUS
  Administered 2018-05-03 (×5): 50 ug via INTRAVENOUS

## 2018-05-03 MED ORDER — ISOPROPYL ALCOHOL 70 % SOLN
Status: DC | PRN
  Administered 2018-05-03: 1 via TOPICAL

## 2018-05-03 MED ORDER — ACETAMINOPHEN 325 MG PO TABS: 325.0000 mg | ORAL_TABLET | Freq: Four times a day (QID) | ORAL | Status: DC | PRN

## 2018-05-03 MED ORDER — ROCURONIUM BROMIDE 10 MG/ML (PF) SYRINGE
PREFILLED_SYRINGE | INTRAVENOUS | Status: AC
  Filled 2018-05-03: qty 10

## 2018-05-03 MED ORDER — BUPIVACAINE-EPINEPHRINE (PF) 0.25% -1:200000 IJ SOLN
INTRAMUSCULAR | Status: AC
  Filled 2018-05-03: qty 30

## 2018-05-03 MED ORDER — WATER FOR IRRIGATION, STERILE IR SOLN
Status: DC | PRN
  Administered 2018-05-03: 2000 mL

## 2018-05-03 MED ORDER — DOCUSATE SODIUM 100 MG PO CAPS
100.0000 mg | ORAL_CAPSULE | Freq: Two times a day (BID) | ORAL | Status: DC
  Administered 2018-05-03 – 2018-05-04 (×2): 100 mg via ORAL
  Filled 2018-05-03 (×2): qty 1

## 2018-05-03 MED ORDER — KETOROLAC TROMETHAMINE 30 MG/ML IJ SOLN
INTRAMUSCULAR | Status: AC
  Filled 2018-05-03: qty 1

## 2018-05-03 MED ORDER — ONDANSETRON HCL 4 MG/2ML IJ SOLN
INTRAMUSCULAR | Status: AC
  Filled 2018-05-03: qty 2

## 2018-05-03 MED ORDER — KETOROLAC TROMETHAMINE 30 MG/ML IJ SOLN
INTRAMUSCULAR | Status: DC | PRN
  Administered 2018-05-03: 30 mg

## 2018-05-03 MED ORDER — SUGAMMADEX SODIUM 200 MG/2ML IV SOLN
INTRAVENOUS | Status: AC
  Filled 2018-05-03: qty 2

## 2018-05-03 MED ORDER — PROPOFOL 10 MG/ML IV BOLUS
INTRAVENOUS | Status: AC
  Filled 2018-05-03: qty 60

## 2018-05-03 MED ORDER — FENTANYL CITRATE (PF) 100 MCG/2ML IJ SOLN
25.0000 ug | INTRAMUSCULAR | Status: DC | PRN
  Administered 2018-05-03: 50 ug via INTRAVENOUS
  Administered 2018-05-03 (×2): 25 ug via INTRAVENOUS

## 2018-05-03 MED ORDER — OLMESARTAN MEDOXOMIL-HCTZ 40-25 MG PO TABS: 1.0000 | ORAL_TABLET | Freq: Every day | ORAL | Status: DC

## 2018-05-03 SURGICAL SUPPLY — 52 items
ACETAB CUP W/GRIPTION 54 (Plate) ×2 IMPLANT
ADH SKN CLS APL DERMABOND .7 (GAUZE/BANDAGES/DRESSINGS) ×1
BAG SPEC THK2 15X12 ZIP CLS (MISCELLANEOUS)
BAG ZIPLOCK 12X15 (MISCELLANEOUS) IMPLANT
CHLORAPREP W/TINT 26ML (MISCELLANEOUS) ×2 IMPLANT
CLOTH BEACON ORANGE TIMEOUT ST (SAFETY) ×2 IMPLANT
COVER PERINEAL POST (MISCELLANEOUS) ×2 IMPLANT
COVER SURGICAL LIGHT HANDLE (MISCELLANEOUS) ×2 IMPLANT
CUP ACETAB W/GRIPTION 54 (Plate) IMPLANT
DECANTER SPIKE VIAL GLASS SM (MISCELLANEOUS) ×3 IMPLANT
DERMABOND ADVANCED (GAUZE/BANDAGES/DRESSINGS) ×1
DERMABOND ADVANCED .7 DNX12 (GAUZE/BANDAGES/DRESSINGS) ×2 IMPLANT
DRAPE SHEET LG 3/4 BI-LAMINATE (DRAPES) ×6 IMPLANT
DRAPE STERI IOBAN 125X83 (DRAPES) ×2 IMPLANT
DRAPE U-SHAPE 47X51 STRL (DRAPES) ×4 IMPLANT
DRESSING AQUACEL AG SP 3.5X10 (GAUZE/BANDAGES/DRESSINGS) IMPLANT
DRSG AQUACEL AG ADV 3.5X10 (GAUZE/BANDAGES/DRESSINGS) ×2 IMPLANT
DRSG AQUACEL AG SP 3.5X10 (GAUZE/BANDAGES/DRESSINGS) ×2
ELECT PENCIL ROCKER SW 15FT (MISCELLANEOUS) ×2 IMPLANT
ELECT REM PT RETURN 15FT ADLT (MISCELLANEOUS) ×2 IMPLANT
GAUZE SPONGE 4X4 12PLY STRL (GAUZE/BANDAGES/DRESSINGS) ×2 IMPLANT
GLOVE BIO SURGEON STRL SZ8.5 (GLOVE) ×4 IMPLANT
GLOVE BIOGEL PI IND STRL 8.5 (GLOVE) ×1 IMPLANT
GLOVE BIOGEL PI INDICATOR 8.5 (GLOVE) ×1
GLOVE SURG SS PI 6.5 STRL IVOR (GLOVE) ×2 IMPLANT
GOWN SPEC L3 XXLG W/TWL (GOWN DISPOSABLE) ×2 IMPLANT
HANDPIECE INTERPULSE COAX TIP (DISPOSABLE) ×2
HEAD CERAMIC DELTA 36 PLUS 1.5 (Hips) ×1 IMPLANT
HOLDER FOLEY CATH W/STRAP (MISCELLANEOUS) ×2 IMPLANT
HOOD PEEL AWAY FLYTE STAYCOOL (MISCELLANEOUS) ×8 IMPLANT
LINER NEUTRAL 36ID 54OD (Liner) ×1 IMPLANT
MARKER SKIN DUAL TIP RULER LAB (MISCELLANEOUS) ×2 IMPLANT
NDL SPNL 18GX3.5 QUINCKE PK (NEEDLE) ×1 IMPLANT
NEEDLE SPNL 18GX3.5 QUINCKE PK (NEEDLE) ×2 IMPLANT
PACK ANTERIOR HIP CUSTOM (KITS) ×2 IMPLANT
SAW OSC TIP CART 19.5X105X1.3 (SAW) ×2 IMPLANT
SEALER BIPOLAR AQUA 6.0 (INSTRUMENTS) ×2 IMPLANT
SET HNDPC FAN SPRY TIP SCT (DISPOSABLE) ×1 IMPLANT
STEM TRI LOC BPS SZ7 W GRIPTON (Hips) IMPLANT
SUT ETHIBOND NAB CT1 #1 30IN (SUTURE) ×4 IMPLANT
SUT MNCRL AB 3-0 PS2 18 (SUTURE) ×2 IMPLANT
SUT MON AB 2-0 CT1 36 (SUTURE) ×4 IMPLANT
SUT STRATAFIX PDO 1 14 VIOLET (SUTURE) ×2
SUT STRATFX PDO 1 14 VIOLET (SUTURE) ×1
SUT VIC AB 2-0 CT1 27 (SUTURE) ×2
SUT VIC AB 2-0 CT1 TAPERPNT 27 (SUTURE) ×1 IMPLANT
SUTURE STRATFX PDO 1 14 VIOLET (SUTURE) ×1 IMPLANT
SYR 50ML LL SCALE MARK (SYRINGE) ×2 IMPLANT
TRAY FOLEY MTR SLVR 16FR STAT (SET/KITS/TRAYS/PACK) IMPLANT
TRI LOC BPS SZ 7 W GRIPTON (Hips) ×2 IMPLANT
WATER STERILE IRR 1000ML POUR (IV SOLUTION) ×2 IMPLANT
YANKAUER SUCT BULB TIP 10FT TU (MISCELLANEOUS) ×2 IMPLANT

## 2018-05-03 NOTE — Discharge Instructions (Signed)
°Dr. Vincie Linn °Joint Replacement Specialist °[facility] °[address] °[place], [place] [address] °[phone] ° ° °TOTAL HIP REPLACEMENT POSTOPERATIVE DIRECTIONS ° ° ° °Hip Rehabilitation, Guidelines Following Surgery  ° °WEIGHT BEARING °Weight bearing as tolerated with assist device (walker, cane, etc) as directed, use it as long as suggested by your surgeon or therapist, typically at least 4-6 weeks. ° °The results of a hip operation are greatly improved after range of motion and muscle strengthening exercises. Follow all safety measures which are given to protect your hip. If any of these exercises cause increased pain or swelling in your joint, decrease the amount until you are comfortable again. Then slowly increase the exercises. Call your caregiver if you have problems or questions.  ° °HOME CARE INSTRUCTIONS  °Most of the following instructions are designed to prevent the dislocation of your new hip.  °Remove items at home which could result in a fall. This includes throw rugs or furniture in walking pathways.  °Continue medications as instructed at time of discharge. °· You may have some home medications which will be placed on hold until you complete the course of blood thinner medication. °· You may start showering once you are discharged home. Do not remove your dressing. °Do not put on socks or shoes without following the instructions of your caregivers.   °Sit on chairs with arms. Use the chair arms to help push yourself up when arising.  °Arrange for the use of a toilet seat elevator so you are not sitting low.  °· Walk with walker as instructed.  °You may resume a sexual relationship in one month or when given the OK by your caregiver.  °Use walker as long as suggested by your caregivers.  °You may put full weight on your legs and walk as much as is comfortable. °Avoid periods of inactivity such as sitting longer than an hour when not asleep. This helps prevent  blood clots.  °You may return to work once you are cleared by your surgeon.  °Do not drive a car for 6 weeks or until released by your surgeon.  °Do not drive while taking narcotics.  °Wear elastic stockings for two weeks following surgery during the day but you may remove then at night.  °Make sure you keep all of your appointments after your operation with all of your doctors and caregivers. You should call the office at the above phone number and make an appointment for approximately two weeks after the date of your surgery. °Please pick up a stool softener and laxative for home use as long as you are requiring pain medications. °· ICE to the affected hip every three hours for 30 minutes at a time and then as needed for pain and swelling. Continue to use ice on the hip for pain and swelling from surgery. You may notice swelling that will progress down to the foot and ankle.  This is normal after surgery.  Elevate the leg when you are not up walking on it.   °It is important for you to complete the blood thinner medication as prescribed by your doctor. °· Continue to use the breathing machine which will help keep your temperature down.  It is common for your temperature to cycle up and down following surgery, especially at night when you are not up moving around and exerting yourself.  The breathing machine keeps your lungs expanded and your temperature down. ° °RANGE OF MOTION AND STRENGTHENING EXERCISES  °These exercises are   designed to help you keep full movement of your hip joint. Follow your caregiver's or physical therapist's instructions. Perform all exercises about fifteen times, three times per day or as directed. Exercise both hips, even if you have had only one joint replacement. These exercises can be done on a training (exercise) mat, on the floor, on a table or on a bed. Use whatever works the best and is most comfortable for you. Use music or television while you are exercising so that the exercises  are a pleasant break in your day. This will make your life better with the exercises acting as a break in routine you can look forward to.  °Lying on your back, slowly slide your foot toward your buttocks, raising your knee up off the floor. Then slowly slide your foot back down until your leg is straight again.  °Lying on your back spread your legs as far apart as you can without causing discomfort.  °Lying on your side, raise your upper leg and foot straight up from the floor as far as is comfortable. Slowly lower the leg and repeat.  °Lying on your back, tighten up the muscle in the front of your thigh (quadriceps muscles). You can do this by keeping your leg straight and trying to raise your heel off the floor. This helps strengthen the largest muscle supporting your knee.  °Lying on your back, tighten up the muscles of your buttocks both with the legs straight and with the knee bent at a comfortable angle while keeping your heel on the floor.  ° °SKILLED REHAB INSTRUCTIONS: °If the patient is transferred to a skilled rehab facility following release from the hospital, a list of the current medications will be sent to the facility for the patient to continue.  When discharged from the skilled rehab facility, please have the facility set up the patient's Home Health Physical Therapy prior to being released. Also, the skilled facility will be responsible for providing the patient with their medications at time of release from the facility to include their pain medication and their blood thinner medication. If the patient is still at the rehab facility at time of the two week follow up appointment, the skilled rehab facility will also need to assist the patient in arranging follow up appointment in our office and any transportation needs. ° °MAKE SURE YOU:  °Understand these instructions.  °Will watch your condition.  °Will get help right away if you are not doing well or get worse. ° °Pick up stool softner and  laxative for home use following surgery while on pain medications. °Do not remove your dressing. °The dressing is waterproof--it is OK to take showers. °Continue to use ice for pain and swelling after surgery. °Do not use any lotions or creams on the incision until instructed by your surgeon. °Total Hip Protocol. ° ° °

## 2018-05-03 NOTE — Interval H&P Note (Signed)
History and Physical Interval Note:  05/03/2018 7:36 AM  [name] [name]  has presented today for surgery, with the diagnosis of Avascular necrosis right hip  The various methods of treatment have been discussed with the patient and family. After consideration of risks, benefits and other options for treatment, the patient has consented to  Procedure(s) with comments: RIGHT TOTAL HIP ARTHROPLASTY ANTERIOR APPROACH (Right) - Needs RNFA as a surgical intervention .  The patient's history has been reviewed, patient examined, no change in status, stable for surgery.  I have reviewed the patient's chart and labs.  Questions were answered to the patient's satisfaction.     Hilton Cork Norman Bier

## 2018-05-03 NOTE — Transfer of Care (Signed)
Immediate Anesthesia Transfer of Care Note  Patient: [name] [name]  Procedure(s) Performed: RIGHT TOTAL HIP ARTHROPLASTY ANTERIOR APPROACH (Right Hip)  Patient Location: PACU  Anesthesia Type:General  Level of Consciousness: awake and alert   Airway & Oxygen Therapy: Patient Spontanous Breathing and Patient connected to face mask oxygen  Post-op Assessment: Report given to RN and Post -op Vital signs reviewed and stable  Post vital signs: Reviewed and stable  Last Vitals:  Vitals Value Taken Time  BP 123/75 05/03/2018 10:23 AM  Temp    Pulse 114 05/03/2018 10:24 AM  Resp 22 05/03/2018 10:24 AM  SpO2 100 % 05/03/2018 10:24 AM  Vitals shown include unvalidated device data.  Last Pain:  Vitals:   05/03/18 0558  TempSrc: Oral         Complications: No apparent anesthesia complications

## 2018-05-03 NOTE — Anesthesia Postprocedure Evaluation (Signed)
Anesthesia Post Note  Patient: [name] [name]  Procedure(s) Performed: RIGHT TOTAL HIP ARTHROPLASTY ANTERIOR APPROACH (Right Hip)     Patient location during evaluation: PACU Anesthesia Type: General Level of consciousness: awake and alert Pain management: pain level controlled Vital Signs Assessment: post-procedure vital signs reviewed and stable Respiratory status: spontaneous breathing, nonlabored ventilation and respiratory function stable Cardiovascular status: blood pressure returned to baseline and stable Postop Assessment: no apparent nausea or vomiting Anesthetic complications: no    Last Vitals:  Vitals:   05/03/18 1130 05/03/18 1146  BP: 123/80 (!) 145/90  Pulse: 100 (!) 104  Resp: 12 16  Temp: 37 C 36.9 C  SpO2: 98% 98%    Last Pain:  Vitals:   05/03/18 1146  TempSrc: Oral  PainSc:                  Audry Pili

## 2018-05-03 NOTE — Evaluation (Signed)
Physical Therapy Evaluation Patient Details Name: [name] [name] MRN: [id number] DOB: 10-May-1956 Today's Date: 05/03/2018   History of Present Illness  Pt s/p R THR and with hx of spinal cord stimulator   Clinical Impression  Pt s/p R THR and presents with decreased R LE strength/ROM and post op pain limiting functional mobility.  Pt should progress to dc home with family assist.    Follow Up Recommendations Follow surgeon's recommendation for DC plan and follow-up therapies    Equipment Recommendations  None recommended by PT    Recommendations for Other Services       Precautions / Restrictions Precautions Precautions: Fall Restrictions Weight Bearing Restrictions: No Other Position/Activity Restrictions: WBAT      Mobility  Bed Mobility Overal bed mobility: Needs Assistance Bed Mobility: Supine to Sit     Supine to sit: Min assist     General bed mobility comments: cues for sequence and use of L LE to self assist  Transfers Overall transfer level: Needs assistance Equipment used: Rolling walker (2 wheeled) Transfers: Sit to/from Stand Sit to Stand: Min assist         General transfer comment: cues for LE management and use of UEs to self assist  Ambulation/Gait Ambulation/Gait assistance: Min assist Gait Distance (Feet): 100 Feet Assistive device: Rolling walker (2 wheeled) Gait Pattern/deviations: Step-to pattern;Decreased step length - right;Decreased step length - left;Shuffle;Trunk flexed Gait velocity: decr   General Gait Details: cues for LE posture, position from RW and initial sequence  Stairs            Wheelchair Mobility    Modified Rankin (Stroke Patients Only)       Balance Overall balance assessment: Mild deficits observed, not formally tested                                           Pertinent Vitals/Pain Pain Assessment: 0-10 Pain Score: 4  Pain Location: R hip Pain Descriptors / Indicators:  Aching;Sore Pain Intervention(s): Limited activity within patient's tolerance;Monitored during session;Premedicated before session;Ice applied    Home Living Family/patient expects to be discharged to:: Private residence Living Arrangements: Spouse/significant other Available Help at Discharge: Family Type of Home: House Home Access: Level entry     Home Layout: [place]: Environmental consultant - 4 wheels;Walker - 2 wheels;Cane - single point      Prior Function Level of Independence: Independent with assistive device(s);Independent         Comments: using rollator     Hand Dominance        Extremity/Trunk Assessment   Upper Extremity Assessment Upper Extremity Assessment: Overall WFL for tasks assessed    Lower Extremity Assessment Lower Extremity Assessment: RLE deficits/detail    Cervical / Trunk Assessment Cervical / Trunk Assessment: Normal  Communication   Communication: No difficulties  Cognition Arousal/Alertness: Awake/alert Behavior During Therapy: WFL for tasks assessed/performed Overall Cognitive Status: Within Functional Limits for tasks assessed                                        General Comments      Exercises Total Joint Exercises Ankle Circles/Pumps: AROM;Both;20 reps;Supine   Assessment/Plan    PT Assessment Patient needs continued PT services  PT Problem List Decreased strength;Decreased range of motion;Decreased  activity tolerance;Decreased mobility;Decreased knowledge of use of DME;Pain       PT Treatment Interventions DME instruction;Gait training;Stair training;Functional mobility training;Therapeutic activities;Therapeutic exercise;Patient/family education    PT Goals (Current goals can be found in the Care Plan section)  Acute Rehab PT Goals Patient Stated Goal: Regain IND PT Goal Formulation: With patient Time For Goal Achievement: 05/10/18 Potential to Achieve Goals: Good    Frequency 7X/week    Barriers to discharge        Co-evaluation               AM-PAC PT "6 Clicks" Daily Activity  Outcome Measure Difficulty turning over in bed (including adjusting bedclothes, sheets and blankets)?: Unable Difficulty moving from lying on back to sitting on the side of the bed? : Unable Difficulty sitting down on and standing up from a chair with arms (e.g., wheelchair, bedside commode, etc,.)?: Unable Help needed moving to and from a bed to chair (including a wheelchair)?: A Little Help needed walking in hospital room?: A Little Help needed climbing 3-5 steps with a railing? : A Little 6 Click Score: 12    End of Session Equipment Utilized During Treatment: Gait belt Activity Tolerance: Patient tolerated treatment well Patient left: in chair;with call bell/phone within reach;with family/visitor present Nurse Communication: Mobility status PT Visit Diagnosis: Difficulty in walking, not elsewhere classified (R26.2)    Time: 9179-1505 PT Time Calculation (min) (ACUTE ONLY): 27 min   Charges:   PT Evaluation $PT Eval Low Complexity: 1 Low PT Treatments $Gait Training: 8-22 mins        Pg [phone]   Jodelle Fausto 05/03/2018, 5:19 PM

## 2018-05-03 NOTE — Op Note (Signed)
OPERATIVE REPORT  SURGEON: Rod Can, MD   ASSISTANT: Nehemiah Massed, PA-C.  PREOPERATIVE DIAGNOSIS: Right hip avascular necrosis.   POSTOPERATIVE DIAGNOSIS: Right hip avascular necrosis.   PROCEDURE: Right total hip arthroplasty, anterior approach.   IMPLANTS: DePuy Tri Lock stem, size 7, hi offset. DePuy Pinnacle Cup, size 54 mm. DePuy Altrx liner, size 36 by 54 mm, neutral. DePuy Biolox ceramic head ball, size 36 + 1.5 mm.  ANESTHESIA:  General  ESTIMATED BLOOD LOSS:-250 mL    ANTIBIOTICS: 2 g Ancef.  DRAINS: None.  COMPLICATIONS: None.   CONDITION: PACU - hemodynamically stable.   BRIEF CLINICAL NOTE: [name] [name] is a 62 y.o. male with a long-standing history of Right hip arthritis. After failing conservative management, the patient was indicated for total hip arthroplasty. The risks, benefits, and alternatives to the procedure were explained, and the patient elected to proceed.  PROCEDURE IN DETAIL: Surgical site was marked by myself in the pre-op holding area. Once inside the operating room, general anesthesia was obtained, and a foley catheter was inserted. The patient was then positioned on the Hana table. All bony prominences were well padded. The hip was prepped and draped in the normal sterile surgical fashion. A time-out was called verifying side and site of surgery. The patient received IV antibiotics within 60 minutes of beginning the procedure.  The direct anterior approach to the hip was performed through the Hueter interval. Lateral femoral circumflex vessels were treated with the Auqumantys. The anterior capsule was exposed and an inverted T capsulotomy was made.The femoral neck cut was made to the level of the templated cut. A corkscrew was placed into the head and the head was removed. The head was passed to the back table and was measured.  Acetabular exposure was achieved, and the pulvinar and labrum were excised. Sequential reaming of  the acetabulum was then performed up to a size 53 mm reamer. A 54 mm cup was then opened and impacted into place at approximately 40 degrees of abduction and 20 degrees of anteversion. The final polyethylene liner was impacted into place and acetabular osteophytes were removed.   I then gained femoral exposure taking care to protect the abductors and greater trochanter. This was performed using standard external rotation, extension, and adduction. The capsule was peeled off the inner aspect of the greater trochanter, taking care to preserve the short external rotators. A cookie cutter was used to enter the femoral canal, and then the femoral canal finder was placed. Sequential broaching was performed up to a size 7. Calcar planer was used on the femoral neck remnant. I placed a hi offset neck and a trial head ball. The hip was reduced. Leg lengths and offset were checked fluoroscopically. The hip was dislocated and trial components were removed. The final implants were placed, and the hip was reduced.  Fluoroscopy was used to confirm component position and leg lengths. At 90 degrees of external rotation and full extension, the hip was stable to an anterior directed force.  The wound was copiously irrigated with normal saline using pulse lavage. Marcaine solution was injected into the periarticular soft tissue. The wound was closed in layers using #1 Vicryl and V-Loc for the fascia, 2-0 Vicryl for the subcutaneous fat, 2-0 Monocryl for the deep dermal layer, 3-0 running Monocryl subcuticular stitch, and Dermabond for the skin. Once the glue was fully dried, an Aquacell Ag dressing was applied. The patient was transported to the recovery room in stable condition. Sponge, needle, and instrument  counts were correct at the end of the case x2. The patient tolerated the procedure well and there were no known complications.  Please note that a surgical assistant was a medical necessity for this  procedure to perform it in a safe and expeditious manner. Assistant was necessary to provide appropriate retraction of vital neurovascular structures, to prevent femoral fracture, and to allow for anatomic placement of the prosthesis.

## 2018-05-03 NOTE — Anesthesia Procedure Notes (Signed)
Procedure Name: Intubation Date/Time: 05/03/2018 7:49 AM Performed by: Sharlette Dense, CRNA Patient Re-evaluated:Patient Re-evaluated prior to induction Oxygen Delivery Method: Circle system utilized Preoxygenation: Pre-oxygenation with 100% oxygen Induction Type: IV induction Ventilation: Mask ventilation without difficulty and Oral airway inserted - appropriate to patient size Laryngoscope Size: Miller and 3 Grade View: Grade I Tube type: Oral Tube size: 8.0 mm Number of attempts: 1 Airway Equipment and Method: Stylet Placement Confirmation: ETT inserted through vocal cords under direct vision,  breath sounds checked- equal and bilateral and positive ETCO2 Secured at: 22 cm Tube secured with: Tape Dental Injury: Teeth and Oropharynx as per pre-operative assessment

## 2018-05-04 ENCOUNTER — Encounter (HOSPITAL_COMMUNITY): Payer: Self-pay | Admitting: Orthopedic Surgery

## 2018-05-04 LAB — CBC
HEMATOCRIT: 32.5 % — AB (ref 39.0–52.0)
HEMOGLOBIN: 11.1 g/dL — AB (ref 13.0–17.0)
MCH: 32.2 pg (ref 26.0–34.0)
MCHC: 34.2 g/dL (ref 30.0–36.0)
MCV: 94.2 fL (ref 78.0–100.0)
Platelets: 162 10*3/uL (ref 150–400)
RBC: 3.45 MIL/uL — ABNORMAL LOW (ref 4.22–5.81)
RDW: 13.5 % (ref 11.5–15.5)
WBC: 15.1 10*3/uL — ABNORMAL HIGH (ref 4.0–10.5)

## 2018-05-04 LAB — BASIC METABOLIC PANEL
Anion gap: 7 (ref 5–15)
BUN: 21 mg/dL — ABNORMAL HIGH (ref 6–20)
CHLORIDE: 107 mmol/L (ref 98–111)
CO2: 24 mmol/L (ref 22–32)
Calcium: 8.8 mg/dL — ABNORMAL LOW (ref 8.9–10.3)
Creatinine, Ser: 0.88 mg/dL (ref 0.61–1.24)
GFR calc Af Amer: >60 mL/min (ref >60)
GFR calc non Af Amer: >60 mL/min (ref >60)
GLUCOSE: 128 mg/dL — AB (ref 70–99)
POTASSIUM: 4.5 mmol/L (ref 3.5–5.1)
SODIUM: 138 mmol/L (ref 135–145)

## 2018-05-04 MED ORDER — ONDANSETRON HCL 4 MG PO TABS: 4.0000 mg | ORAL_TABLET | Freq: Four times a day (QID) | ORAL | 0 refills | Status: DC | PRN

## 2018-05-04 MED ORDER — ASPIRIN 81 MG PO CHEW: 81.0000 mg | CHEWABLE_TABLET | Freq: Two times a day (BID) | ORAL | 1 refills | Status: DC

## 2018-05-04 MED ORDER — DOCUSATE SODIUM 100 MG PO CAPS: 100.0000 mg | ORAL_CAPSULE | Freq: Two times a day (BID) | ORAL | 1 refills | Status: DC

## 2018-05-04 MED ORDER — HYDROCODONE-ACETAMINOPHEN 5-325 MG PO TABS: 1.0000 | ORAL_TABLET | Freq: Four times a day (QID) | ORAL | 0 refills | Status: DC | PRN

## 2018-05-04 MED ORDER — METHOCARBAMOL 500 MG PO TABS: 500.0000 mg | ORAL_TABLET | Freq: Four times a day (QID) | ORAL | 0 refills | Status: DC | PRN

## 2018-05-04 MED ORDER — SENNA 8.6 MG PO TABS: 1.0000 | ORAL_TABLET | Freq: Two times a day (BID) | ORAL | 0 refills | Status: DC

## 2018-05-04 NOTE — Progress Notes (Signed)
    Subjective:  Patient reports pain as mild to moderate.  Denies N/V/CP/SOB. No c/o.  Objective:   VITALS:   Vitals:   05/03/18 1847 05/03/18 2155 05/04/18 0147 05/04/18 0617  BP: 111/75 108/67 113/82 130/76  Pulse: 99 (!) 102 (!) 118 93  Resp: 15 16 16 17   Temp: 97.6 F (36.4 C) 97.7 F (36.5 C) 98.5 F (36.9 C) 98.2 F (36.8 C)  TempSrc: Oral Oral Oral Oral  SpO2: 98% 98% 96% 99%  Weight:      Height:        NAD ABD soft Sensation intact distally Intact pulses distally Dorsiflexion/Plantar flexion intact Incision: dressing C/D/I Compartment soft   Lab Results  Component Value Date   WBC 15.1 (H) 05/04/2018   HGB 11.1 (L) 05/04/2018   HCT 32.5 (L) 05/04/2018   MCV 94.2 05/04/2018   PLT 162 05/04/2018   BMET    Component Value Date/Time   NA 138 05/04/2018 0536   K 4.5 05/04/2018 0536   CL 107 05/04/2018 0536   CO2 24 05/04/2018 0536   GLUCOSE 128 (H) 05/04/2018 0536   BUN 21 (H) 05/04/2018 0536   CREATININE 0.88 05/04/2018 0536   CALCIUM 8.8 (L) 05/04/2018 0536   GFRNONAA >60 05/04/2018 0536   GFRAA >60 05/04/2018 0536     Assessment/Plan: 1 Day Post-Op   Principal Problem:   Avascular necrosis of hip, right (HCC)   WBAT with walker DVT ppx: Aspirin, SCDs, TEDS PO pain control PT/OT Dispo: D/C home with HEP   [name] [name] 05/04/2018, 7:46 AM   Rod Can, MD Cell [phone]

## 2018-05-04 NOTE — Progress Notes (Signed)
Reviewed discharge medications, prescriptions and instructions. Patient states understanding.

## 2018-05-04 NOTE — Progress Notes (Signed)
Physical Therapy Treatment Patient Details Name: [name] [name] MRN: [id number] DOB: [birth year] Today's Date: 05/04/2018    History of Present Illness Pt s/p R THR and with hx of spinal cord stimulator     PT Comments    Pt performed home therex with written instruction and progression provided.  Pt and spouse eager for return home.   Follow Up Recommendations  Follow surgeon's recommendation for DC plan and follow-up therapies     Equipment Recommendations  None recommended by PT    Recommendations for Other Services       Precautions / Restrictions Precautions Precautions: Fall Restrictions Weight Bearing Restrictions: No Other Position/Activity Restrictions: WBAT    Mobility  Bed Mobility Overal bed mobility: Needs Assistance Bed Mobility: Supine to Sit;Sit to Supine     Supine to sit: Min guard Sit to supine: Min guard   General bed mobility comments: cues for sequence and use of L LE to self assist R LE  Transfers Overall transfer level: Needs assistance Equipment used: Rolling walker (2 wheeled) Transfers: Sit to/from Stand Sit to Stand: Supervision         General transfer comment: cues for LE management and use of UEs to self assist  Ambulation/Gait Ambulation/Gait assistance: Min guard;Supervision Gait Distance (Feet): 200 Feet Assistive device: 4-wheeled walker Gait Pattern/deviations: Decreased step length - right;Decreased step length - left;Shuffle;Trunk flexed;Step-through pattern Gait velocity: decr   General Gait Details: cues for LE posture, position from RW and initial sequence   Stairs Stairs: Yes Stairs assistance: Min assist Stair Management: One rail Left;Step to pattern;Forwards;With cane Number of Stairs: 10 General stair comments: cues for sequence and foot/cane placement   Wheelchair Mobility    Modified Rankin (Stroke Patients Only)       Balance Overall balance assessment: Mild deficits observed, not formally  tested                                          Cognition Arousal/Alertness: Awake/alert Behavior During Therapy: WFL for tasks assessed/performed Overall Cognitive Status: Within Functional Limits for tasks assessed                                        Exercises Total Joint Exercises Ankle Circles/Pumps: AROM;Both;20 reps;Supine Quad Sets: AROM;Both;10 reps;Supine Heel Slides: AAROM;Right;20 reps;Supine Hip ABduction/ADduction: AAROM;Right;15 reps;Supine    General Comments        Pertinent Vitals/Pain Pain Assessment: 0-10 Pain Score: 4  Pain Location: R hip Pain Descriptors / Indicators: Aching;Sore Pain Intervention(s): Premedicated before session;Monitored during session;Limited activity within patient's tolerance    Home Living                      Prior Function            PT Goals (current goals can now be found in the care plan section) Acute Rehab PT Goals Patient Stated Goal: Regain IND PT Goal Formulation: With patient Time For Goal Achievement: 05/10/18 Potential to Achieve Goals: Good Progress towards PT goals: Progressing toward goals    Frequency    7X/week      PT Plan Current plan remains appropriate    Co-evaluation              AM-PAC PT "6 Clicks" Daily Activity  Outcome Measure  Difficulty turning over in bed (including adjusting bedclothes, sheets and blankets)?: A Little Difficulty moving from lying on back to sitting on the side of the bed? : A Little Difficulty sitting down on and standing up from a chair with arms (e.g., wheelchair, bedside commode, etc,.)?: A Little Help needed moving to and from a bed to chair (including a wheelchair)?: A Little Help needed walking in hospital room?: A Little Help needed climbing 3-5 steps with a railing? : A Little 6 Click Score: 18    End of Session Equipment Utilized During Treatment: Gait belt Activity Tolerance: Patient tolerated  treatment well Patient left: in chair;with call bell/phone within reach;with family/visitor present Nurse Communication: Mobility status PT Visit Diagnosis: Difficulty in walking, not elsewhere classified (R26.2)     Time: 7681-1572 PT Time Calculation (min) (ACUTE ONLY): 22 min  Charges:  $Gait Training: 8-22 mins $Therapeutic Exercise: 8-22 mins $Therapeutic Activity: 8-22 mins                     Pg [phone]    [name] 05/04/2018, 12:27 PM

## 2018-05-04 NOTE — Progress Notes (Signed)
Physical Therapy Treatment Patient Details Name: [name] [name] MRN: [id number] DOB: August 20, 1956 Today's Date: 05/04/2018    History of Present Illness Pt s/p R THR and with hx of spinal cord stimulator     PT Comments    Pt motivated and progressing well with mobility.   Follow Up Recommendations  Follow surgeon's recommendation for DC plan and follow-up therapies     Equipment Recommendations  None recommended by PT    Recommendations for Other Services       Precautions / Restrictions Precautions Precautions: Fall Restrictions Weight Bearing Restrictions: No Other Position/Activity Restrictions: WBAT    Mobility  Bed Mobility               General bed mobility comments: Pt up in chair and requests back to same  Transfers Overall transfer level: Needs assistance Equipment used: Rolling walker (2 wheeled) Transfers: Sit to/from Stand Sit to Stand: Min guard         General transfer comment: cues for LE management and use of UEs to self assist  Ambulation/Gait Ambulation/Gait assistance: Min guard Gait Distance (Feet): 250 Feet Assistive device: 4-wheeled walker Gait Pattern/deviations: Decreased step length - right;Decreased step length - left;Shuffle;Trunk flexed;Step-through pattern Gait velocity: decr   General Gait Details: cues for LE posture, position from RW and initial sequence   Stairs             Wheelchair Mobility    Modified Rankin (Stroke Patients Only)       Balance Overall balance assessment: Mild deficits observed, not formally tested                                          Cognition Arousal/Alertness: Awake/alert Behavior During Therapy: WFL for tasks assessed/performed Overall Cognitive Status: Within Functional Limits for tasks assessed                                        Exercises Total Joint Exercises Ankle Circles/Pumps: AROM;Both;20 reps;Supine Quad Sets:  AROM;Both;10 reps;Supine Heel Slides: AAROM;Right;20 reps;Supine Hip ABduction/ADduction: AAROM;Right;15 reps;Supine    General Comments        Pertinent Vitals/Pain Pain Assessment: 0-10 Pain Score: 4  Pain Location: R hip Pain Descriptors / Indicators: Aching;Sore Pain Intervention(s): Limited activity within patient's tolerance;Monitored during session;Premedicated before session    Home Living                      Prior Function            PT Goals (current goals can now be found in the care plan section) Acute Rehab PT Goals Patient Stated Goal: Regain IND PT Goal Formulation: With patient Time For Goal Achievement: 05/10/18 Potential to Achieve Goals: Good Progress towards PT goals: Progressing toward goals    Frequency    7X/week      PT Plan Current plan remains appropriate    Co-evaluation              AM-PAC PT "6 Clicks" Daily Activity  Outcome Measure  Difficulty turning over in bed (including adjusting bedclothes, sheets and blankets)?: A Lot Difficulty moving from lying on back to sitting on the side of the bed? : A Lot Difficulty sitting down on and standing up from a chair with  arms (e.g., wheelchair, bedside commode, etc,.)?: A Lot Help needed moving to and from a bed to chair (including a wheelchair)?: A Little Help needed walking in hospital room?: A Little Help needed climbing 3-5 steps with a railing? : A Little 6 Click Score: 15    End of Session Equipment Utilized During Treatment: Gait belt Activity Tolerance: Patient tolerated treatment well Patient left: in chair;with call bell/phone within reach;with family/visitor present Nurse Communication: Mobility status PT Visit Diagnosis: Difficulty in walking, not elsewhere classified (R26.2)     Time: 8412-8208 PT Time Calculation (min) (ACUTE ONLY): 30 min  Charges:  $Gait Training: 8-22 mins $Therapeutic Exercise: 8-22 mins                     Pg [phone]    Derren Suydam 05/04/2018, 12:19 PM

## 2018-05-04 NOTE — Discharge Summary (Signed)
Physician Discharge Summary  Patient ID: [name] [name] MRN: [id number] DOB/AGE: [birth year] 62 y.o.  Admit date: 05/03/2018 Discharge date: 05/04/2018  Admission Diagnoses:  Avascular necrosis of hip, right [facility])  Discharge Diagnoses:  Principal Problem:   Avascular necrosis of hip, right [facility])   Past Medical History:  Diagnosis Date  . Acute kidney failure ([place]) 04/2018  . CKD (chronic kidney disease), stage II   . Complication of anesthesia   . DDD (degenerative disc disease), lumbar   . History of pancreatitis    20-30 years ago  . History of venomous snake bite    rt index finger-  . Hypercholesterolemia   . Hypertension   . Nerve pain    Right leg, resolved with stimulator  . PONV (postoperative nausea and vomiting)   . S/P insertion of spinal cord stimulator 08/2017  . Spinal stenosis   . Tobacco abuse     Surgeries: Procedure(s): RIGHT TOTAL HIP ARTHROPLASTY ANTERIOR APPROACH on 05/03/2018   Consultants (if any):   Discharged Condition: Improved  Hospital Course: [name] [name] is an 62 y.o. male who was admitted 05/03/2018 with a diagnosis of Avascular necrosis of hip, right ([place]) and went to the operating room on 05/03/2018 and underwent the above named procedures.    He was given perioperative antibiotics:  Anti-infectives (From admission, onward)   Start     Dose/Rate Route Frequency Ordered Stop   05/03/18 1400  ceFAZolin (ANCEF) IVPB 2g/100 mL premix     2 g 200 mL/hr over 30 Minutes Intravenous Every 6 hours 05/03/18 1156 05/03/18 2025   05/03/18 0600  ceFAZolin (ANCEF) IVPB 2g/100 mL premix     2 g 200 mL/hr over 30 Minutes Intravenous On call to O.R. 05/03/18 0539 05/03/18 0800    .  He was given sequential compression devices, early ambulation, and ASA for DVT prophylaxis.  He benefited maximally from the hospital stay and there were no complications.    Recent vital signs:  Vitals:   05/04/18 0617 05/04/18 0919  BP: 130/76 101/69  Pulse: 93 95   Resp: 17 12  Temp: 98.2 F (36.8 C) 97.8 F (36.6 C)  SpO2: 99% 98%    Recent laboratory studies:  Lab Results  Component Value Date   HGB 11.1 (L) 05/04/2018   HGB 15.6 04/03/2012   HGB 14.4 01/30/2012   Lab Results  Component Value Date   WBC 15.1 (H) 05/04/2018   PLT 162 05/04/2018   Lab Results  Component Value Date   INR 1.02 05/19/2011   Lab Results  Component Value Date   NA 138 05/04/2018   K 4.5 05/04/2018   CL 107 05/04/2018   CO2 24 05/04/2018   BUN 21 (H) 05/04/2018   CREATININE 0.88 05/04/2018   GLUCOSE 128 (H) 05/04/2018    Discharge Medications:   Allergies as of 05/04/2018      Reactions   Codeine Other (See Comments)   "Gives me a headache for days."      Medication List    STOP taking these medications   aspirin 325 MG EC tablet Replaced by:  aspirin 81 MG chewable tablet     TAKE these medications   aspirin 81 MG chewable tablet Chew 1 tablet (81 mg total) by mouth 2 (two) times daily. Replaces:  aspirin 325 MG EC tablet   CENTRUM ULTRA MENS Tabs Take 1 tablet by mouth daily.   docusate sodium 100 MG capsule Commonly known as:  COLACE Take 1  capsule (100 mg total) by mouth 2 (two) times daily.   Fish Oil 1000 MG Caps Take 1 capsule by mouth daily.   gabapentin 300 MG capsule Commonly known as:  NEURONTIN Take 300 mg by mouth 3 (three) times daily.   HYDROcodone-acetaminophen 5-325 MG tablet Commonly known as:  NORCO/VICODIN Take 1-2 tablets by mouth every 6 (six) hours as needed (postop hip pain).   methocarbamol 500 MG tablet Commonly known as:  ROBAXIN Take 1 tablet (500 mg total) by mouth every 6 (six) hours as needed for muscle spasms.   olmesartan-hydrochlorothiazide 40-25 MG tablet Commonly known as:  BENICAR HCT Take 1 tablet by mouth daily.   ondansetron 4 MG tablet Commonly known as:  ZOFRAN Take 1 tablet (4 mg total) by mouth every 6 (six) hours as needed for nausea.   rosuvastatin 10 MG tablet Commonly  known as:  CRESTOR Take 10 mg by mouth daily.   senna 8.6 MG Tabs tablet Commonly known as:  SENOKOT Take 1 tablet (8.6 mg total) by mouth 2 (two) times daily.   Vitamin D (Ergocalciferol) 50000 units Caps capsule Commonly known as:  DRISDOL Take 50,000 Units by mouth every 7 (seven) days.       Diagnostic Studies: Dg Pelvis Portable  Result Date: 05/03/2018 CLINICAL DATA:  Arthroplasty EXAM: PORTABLE PELVIS 1-2 VIEWS COMPARISON:  03/12/2018 FINDINGS: Total RIGHT hip arthroplasty.  No fracture dislocation. IMPRESSION: No complication following RIGHT hip arthroplasty. Electronically Signed   By: Suzy Bouchard M.D.   On: 05/03/2018 10:45   Dg C-arm 1-60 Min-no Report  Result Date: 05/03/2018 Fluoroscopy was utilized by the requesting physician.  No radiographic interpretation.    Disposition:    Discharge Instructions    Call MD / Call 911   Complete by:  As directed    If you experience chest pain or shortness of breath, CALL 911 and be transported to the hospital emergency room.  If you develope a fever above 101 F, pus (white drainage) or increased drainage or redness at the wound, or calf pain, call your surgeon's office.   Constipation Prevention   Complete by:  As directed    Drink plenty of fluids.  Prune juice may be helpful.  You may use a stool softener, such as Colace (over the counter) 100 mg twice a day.  Use MiraLax (over the counter) for constipation as needed.   Diet - low sodium heart healthy   Complete by:  As directed    Driving restrictions   Complete by:  As directed    No driving for 6 weeks   Increase activity slowly as tolerated   Complete by:  As directed    Lifting restrictions   Complete by:  As directed    No lifting for 6 weeks   TED hose   Complete by:  As directed    Use stockings (TED hose) for 2 weeks on both leg(s).  You may remove them at night for sleeping.      Follow-up Information    Wilene Pharo, Aaron Edelman, MD. Schedule an appointment  as soon as possible for a visit in 2 weeks.   Specialty:  Orthopedic Surgery Why:  For wound re-check Contact information: [address] [place] [address] [phone]            Signed: Hilton Cork Brolin Dambrosia 05/04/2018, 5:39 PM

## 2018-05-04 NOTE — Progress Notes (Signed)
Physical Therapy Treatment Patient Details Name: [name] [name] MRN: [id number] DOB: [birth year] Today's Date: 05/04/2018    History of Present Illness Pt s/p R THR and with hx of spinal cord stimulator     PT Comments    Pt progressing well with mobility.  Spouse present to review stairs and car transfers.   Follow Up Recommendations  Follow surgeon's recommendation for DC plan and follow-up therapies     Equipment Recommendations  None recommended by PT    Recommendations for Other Services       Precautions / Restrictions Precautions Precautions: Fall Restrictions Weight Bearing Restrictions: No Other Position/Activity Restrictions: WBAT    Mobility  Bed Mobility Overal bed mobility: Needs Assistance Bed Mobility: Supine to Sit;Sit to Supine     Supine to sit: Min guard Sit to supine: Min guard   General bed mobility comments: cues for sequence and use of L LE to self assist R LE  Transfers Overall transfer level: Needs assistance Equipment used: Rolling walker (2 wheeled) Transfers: Sit to/from Stand Sit to Stand: Supervision         General transfer comment: cues for LE management and use of UEs to self assist  Ambulation/Gait Ambulation/Gait assistance: Min guard;Supervision Gait Distance (Feet): 200 Feet Assistive device: 4-wheeled walker Gait Pattern/deviations: Decreased step length - right;Decreased step length - left;Shuffle;Trunk flexed;Step-through pattern Gait velocity: decr   General Gait Details: cues for LE posture, position from RW and initial sequence   Stairs Stairs: Yes Stairs assistance: Min assist Stair Management: One rail Left;Step to pattern;Forwards;With cane Number of Stairs: 10 General stair comments: cues for sequence and foot/cane placement   Wheelchair Mobility    Modified Rankin (Stroke Patients Only)       Balance Overall balance assessment: Mild deficits observed, not formally tested                                           Cognition Arousal/Alertness: Awake/alert Behavior During Therapy: WFL for tasks assessed/performed Overall Cognitive Status: Within Functional Limits for tasks assessed                                        Exercises Total Joint Exercises Ankle Circles/Pumps: AROM;Both;20 reps;Supine Quad Sets: AROM;Both;10 reps;Supine Heel Slides: AAROM;Right;20 reps;Supine Hip ABduction/ADduction: AAROM;Right;15 reps;Supine    General Comments        Pertinent Vitals/Pain Pain Assessment: 0-10 Pain Score: 4  Pain Location: R hip Pain Descriptors / Indicators: Aching;Sore Pain Intervention(s): Limited activity within patient's tolerance;Monitored during session;Premedicated before session    Home Living                      Prior Function            PT Goals (current goals can now be found in the care plan section) Acute Rehab PT Goals Patient Stated Goal: Regain IND PT Goal Formulation: With patient Time For Goal Achievement: 05/10/18 Potential to Achieve Goals: Good Progress towards PT goals: Progressing toward goals    Frequency    7X/week      PT Plan Current plan remains appropriate    Co-evaluation              AM-PAC PT "6 Clicks" Daily Activity  Outcome Measure  Difficulty turning over in bed (including adjusting bedclothes, sheets and blankets)?: A Little Difficulty moving from lying on back to sitting on the side of the bed? : A Little Difficulty sitting down on and standing up from a chair with arms (e.g., wheelchair, bedside commode, etc,.)?: A Little Help needed moving to and from a bed to chair (including a wheelchair)?: A Little Help needed walking in hospital room?: A Little Help needed climbing 3-5 steps with a railing? : A Little 6 Click Score: 18    End of Session Equipment Utilized During Treatment: Gait belt Activity Tolerance: Patient tolerated treatment well Patient left: in  chair;with call bell/phone within reach;with family/visitor present Nurse Communication: Mobility status PT Visit Diagnosis: Difficulty in walking, not elsewhere classified (R26.2)     Time: 1100-1130 PT Time Calculation (min) (ACUTE ONLY): 30 min  Charges:  $Gait Training: 8-22 mins $Therapeutic Exercise: 8-22 mins $Therapeutic Activity: 8-22 mins                     Pg [phone]    Denielle Bayard 05/04/2018, 12:24 PM

## 2018-06-11 DIAGNOSIS — I10 Essential (primary) hypertension: Secondary | ICD-10-CM | POA: Diagnosis not present

## 2018-06-11 DIAGNOSIS — Z1389 Encounter for screening for other disorder: Secondary | ICD-10-CM | POA: Diagnosis not present

## 2018-06-11 DIAGNOSIS — Z6830 Body mass index (BMI) 30.0-30.9, adult: Secondary | ICD-10-CM | POA: Diagnosis not present

## 2018-06-11 DIAGNOSIS — Z0000 Encounter for general adult medical examination without abnormal findings: Secondary | ICD-10-CM | POA: Diagnosis not present

## 2018-06-11 DIAGNOSIS — E6609 Other obesity due to excess calories: Secondary | ICD-10-CM | POA: Diagnosis not present

## 2018-06-12 DIAGNOSIS — M87051 Idiopathic aseptic necrosis of right femur: Secondary | ICD-10-CM | POA: Diagnosis not present

## 2018-10-12 DIAGNOSIS — Z1389 Encounter for screening for other disorder: Secondary | ICD-10-CM | POA: Diagnosis not present

## 2018-10-12 DIAGNOSIS — J069 Acute upper respiratory infection, unspecified: Secondary | ICD-10-CM | POA: Diagnosis not present

## 2018-10-12 DIAGNOSIS — Z6831 Body mass index (BMI) 31.0-31.9, adult: Secondary | ICD-10-CM | POA: Diagnosis not present

## 2018-10-12 DIAGNOSIS — E6609 Other obesity due to excess calories: Secondary | ICD-10-CM | POA: Diagnosis not present

## 2018-10-12 DIAGNOSIS — E782 Mixed hyperlipidemia: Secondary | ICD-10-CM | POA: Diagnosis not present

## 2018-10-22 ENCOUNTER — Encounter: Payer: Self-pay | Admitting: Internal Medicine

## 2018-10-24 DIAGNOSIS — Z96649 Presence of unspecified artificial hip joint: Secondary | ICD-10-CM | POA: Insufficient documentation

## 2018-10-26 DIAGNOSIS — Z96641 Presence of right artificial hip joint: Secondary | ICD-10-CM | POA: Diagnosis not present

## 2018-10-26 DIAGNOSIS — Z471 Aftercare following joint replacement surgery: Secondary | ICD-10-CM | POA: Diagnosis not present

## 2018-10-31 DIAGNOSIS — E669 Obesity, unspecified: Secondary | ICD-10-CM | POA: Diagnosis not present

## 2018-10-31 DIAGNOSIS — N182 Chronic kidney disease, stage 2 (mild): Secondary | ICD-10-CM | POA: Diagnosis not present

## 2018-10-31 DIAGNOSIS — E559 Vitamin D deficiency, unspecified: Secondary | ICD-10-CM | POA: Diagnosis not present

## 2018-11-16 DIAGNOSIS — M5136 Other intervertebral disc degeneration, lumbar region: Secondary | ICD-10-CM | POA: Diagnosis not present

## 2018-11-16 DIAGNOSIS — Z1389 Encounter for screening for other disorder: Secondary | ICD-10-CM | POA: Diagnosis not present

## 2018-11-16 DIAGNOSIS — E6609 Other obesity due to excess calories: Secondary | ICD-10-CM | POA: Diagnosis not present

## 2018-11-16 DIAGNOSIS — Z6832 Body mass index (BMI) 32.0-32.9, adult: Secondary | ICD-10-CM | POA: Diagnosis not present

## 2018-11-30 DIAGNOSIS — M5136 Other intervertebral disc degeneration, lumbar region: Secondary | ICD-10-CM | POA: Diagnosis not present

## 2018-11-30 DIAGNOSIS — Z6831 Body mass index (BMI) 31.0-31.9, adult: Secondary | ICD-10-CM | POA: Diagnosis not present

## 2018-12-10 DIAGNOSIS — B029 Zoster without complications: Secondary | ICD-10-CM | POA: Diagnosis not present

## 2021-02-16 ENCOUNTER — Other Ambulatory Visit (HOSPITAL_COMMUNITY): Payer: Self-pay | Admitting: Otolaryngology

## 2021-02-16 ENCOUNTER — Other Ambulatory Visit: Payer: Self-pay | Admitting: Otolaryngology

## 2021-02-16 DIAGNOSIS — J029 Acute pharyngitis, unspecified: Secondary | ICD-10-CM

## 2021-04-07 ENCOUNTER — Ambulatory Visit (HOSPITAL_COMMUNITY)
Admission: RE | Admit: 2021-04-07 | Discharge: 2021-04-07 | Disposition: A | Discharge status: Home / Self Care | Payer: No Typology Code available for payment source | Source: Ambulatory Visit | Attending: Otolaryngology | Admitting: Otolaryngology

## 2021-04-07 DIAGNOSIS — J029 Acute pharyngitis, unspecified: Secondary | ICD-10-CM | POA: Diagnosis not present

## 2021-04-07 MED ORDER — IOHEXOL 300 MG/ML  SOLN
75.0000 mL | Freq: Once | INTRAMUSCULAR | Status: AC | PRN
  Administered 2021-04-07: 75 mL via INTRAVENOUS

## 2021-04-08 LAB — POCT I-STAT CREATININE: Creatinine, Ser: 1.2 mg/dL (ref 0.61–1.24)

## 2021-05-26 ENCOUNTER — Encounter (INDEPENDENT_AMBULATORY_CARE_PROVIDER_SITE_OTHER): Payer: Self-pay | Admitting: *Deleted

## 2021-09-30 ENCOUNTER — Other Ambulatory Visit (INDEPENDENT_AMBULATORY_CARE_PROVIDER_SITE_OTHER): Payer: Self-pay | Admitting: Otolaryngology

## 2021-10-07 ENCOUNTER — Other Ambulatory Visit (HOSPITAL_COMMUNITY): Payer: Self-pay | Admitting: Otolaryngology

## 2021-10-07 ENCOUNTER — Other Ambulatory Visit: Payer: Self-pay | Admitting: Otolaryngology

## 2021-10-07 DIAGNOSIS — C099 Malignant neoplasm of tonsil, unspecified: Secondary | ICD-10-CM

## 2021-10-11 ENCOUNTER — Ambulatory Visit (INDEPENDENT_AMBULATORY_CARE_PROVIDER_SITE_OTHER): Payer: 59 | Admitting: Gastroenterology

## 2021-10-12 ENCOUNTER — Telehealth: Payer: Self-pay | Admitting: Hematology and Oncology

## 2021-10-12 ENCOUNTER — Other Ambulatory Visit: Payer: Self-pay

## 2021-10-12 DIAGNOSIS — C099 Malignant neoplasm of tonsil, unspecified: Secondary | ICD-10-CM

## 2021-10-12 NOTE — Telephone Encounter (Signed)
Scheduled appt per 1/5 referral. Pt is aware of appt date and time. Pt is aware to arrive 15 mins prior to appt time.

## 2021-10-18 ENCOUNTER — Encounter (HOSPITAL_COMMUNITY)
Admission: RE | Admit: 2021-10-18 | Discharge: 2021-10-18 | Disposition: A | Discharge status: Home / Self Care | Payer: No Typology Code available for payment source | Source: Ambulatory Visit | Attending: Otolaryngology | Admitting: Otolaryngology

## 2021-10-18 ENCOUNTER — Other Ambulatory Visit: Payer: Self-pay

## 2021-10-18 DIAGNOSIS — C099 Malignant neoplasm of tonsil, unspecified: Secondary | ICD-10-CM | POA: Diagnosis present

## 2021-10-18 LAB — GLUCOSE, CAPILLARY: Glucose-Capillary: 135 mg/dL — ABNORMAL HIGH (ref 70–99)

## 2021-10-18 MED ORDER — FLUDEOXYGLUCOSE F - 18 (FDG) INJECTION
9.3000 | Freq: Once | INTRAVENOUS | Status: AC
  Administered 2021-10-18: 7.6 via INTRAVENOUS

## 2021-10-21 NOTE — Progress Notes (Signed)
Oncology Nurse Navigator Documentation   Placed introductory call to new referral patient [name] [name] Introduced myself as the H&N oncology nurse navigator that works with Dr. Isidore Moos and Dr. Chryl Heck to whom he has been referred by Dr. Benjamine Mola. He confirmed understanding of referral. Briefly explained my role as his navigator, provided my contact information.  Confirmed understanding of upcoming appts and [place] location, explained arrival and registration process. I explained the purpose of a dental evaluation prior to starting RT. He is scheduled to see Dr. Benson Norway on 10/26/21 I encouraged him to call with questions/concerns as he moves forward with appts and procedures.   He verbalized understanding of information provided, expressed appreciation for my call.   Navigator Initial Assessment Employment Status:not known Currently on FMLA / STD: Living Situation: he lives with his wife.  Support System: wife, family PCP: [name] MD PCD: Financial Concerns:not at this time.  Transportation Needs: no Sensory Deficits:no Language Barriers/Interpreter Needed:  no Ambulation Needs: no DME Used in Home: no Psychosocial Needs:  no Concerns/Needs Understanding Cancer:  addressed/answered by navigator to best of ability Self-Expressed Needs: no   Harlow Asa RN, BSN, OCN Head & Neck Oncology Nurse [place] at [facility] Phone # [phone]  Fax # [phone]

## 2021-10-22 NOTE — Progress Notes (Signed)
Head and Neck Cancer Location of Tumor / Histology:  Squamous cell carcinoma of oropharynx, p16(-)  Patient presented with symptoms of: (from Dr. Kristopher Glee office note on 09/30/2021)    PET Scan 10/18/2021 --IMPRESSION: Hypermetabolic mass centered along the wall of the left posterolateral oropharynx, consistent with known tonsillar neoplasm. Hypermetabolic bilateral cervical lymph nodes, suspicious for disease involvement. No evidence of hypermetabolic metastatic disease in the chest, abdomen, or pelvis. Additional ancillary chronic findings described above.  Biopsies revealed:  09/30/2021 Diagnosis Tonsil, biopsy, left, mass - INVASIVE SQUAMOUS CELL CARCINOMA WITH ULCERATION AND NECROSIS. - P16 IMMUNOSTAIN IS NEGATIVE WITHIN THE NEOPLASM. - SEE COMMENT. Microscopic Comment The tumor extends the entire 6 mm linear extent of the biopsy and involves the deep aspect. A p16 immunostain is negative in the neoplasm.  Nutrition Status Yes No Comments  Weight changes? []  [x]    Swallowing concerns? []  [x]  Denies any difficulty other than pain to the left side of his throat when he's eating/drinking  PEG? []  [x]     Referrals Yes No Comments  Social Work? [x]  []    Dentistry? [x]  []  Scheduled to meet with Dr. Sandi Mariscal 10/26/2021  Swallowing therapy? [x]  []    Nutrition? [x]  []    Med/Onc? [x]  []  Dr. Arletha Pili Iruku   Safety Issues Yes No Comments  Prior radiation? []  [x]    Pacemaker/ICD? []  [x]  Does have a spine/nerve stimulator   Possible current pregnancy? []  [x]  N/A  Is the patient on methotrexate? []  [x]     Tobacco/Marijuana/Snuff/ETOH use: Has smoked ~1/2 pack/day for ~20 years, but denies any smokeless tobacco use. Occasionally drinks alcohol, but denies any recreational drug use.  Past/Anticipated interventions by otolaryngology, if any:  09/30/2022 --Dr. Leta Baptist   Past/Anticipated interventions by medical oncology, if any:  Meeting with Dr. Benay Pike later this  morning  Current Complaints / other details:  Interested in knowing if surgical resection is an option

## 2021-10-24 NOTE — Progress Notes (Signed)
Radiation Oncology         [phone] ________________________________  Initial Outpatient Consultation  Name: [name] [name] MRN: [id number]  Date: 10/25/2021  DOB: 01/26/1956  OV:ZCHYIFO, Jenny Reichmann, MD  Leta Baptist, MD   REFERRING PHYSICIAN: Leta Baptist, MD  DIAGNOSIS:    ICD-10-CM   1. 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 Squamous cell carcinoma of left tonsil (HCC)  C09.9     3. 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 (HCC)  C10.3       Cancer Staging  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 ([place]) Staging form: Pharynx - P16 Negative Oropharynx, AJCC 8th Edition - Clinical stage from 10/26/2021: Stage IVA (cT2, cN2c, cM0, p16-) - Signed by Eppie Gibson, MD on 10/26/2021 Stage prefix: Initial diagnosis  Squamous cell carcinoma of left tonsil (HCC) Staging form: Pharynx - P16 Negative Oropharynx, AJCC 8th Edition - Clinical stage from 10/26/2021: Stage IVA (cT2, cN2c, cM0, p16-) - Signed by Eppie Gibson, MD on 10/26/2021 Stage prefix: Initial diagnosis   Squamous cell carcinoma of the left tonsil, p16(-)  CHIEF COMPLAINT: Here to discuss management of oropharyngeal cancer  HISTORY OF PRESENT ILLNESS::[name] [name] is a 66 y.o. male who initially presented with left sided throat pain to Dr. Benjamine Mola in May of 2022. At that time, he was noted to have a slightly prominent lingual tonsil. He also had a neck CT on 04/07/21 which did not show any masses or enlarged lymph nodes. (Of note: an indeterminate non-enlarged 7 mm left retropharyngeal lymph node was appreciated with a mild decrease in density).   The patient returned to Dr. Benjamine Mola on 09/30/21 with complaints of worsening left sided sore throat, as well as new onset of dysphagia and odynophagia. Physical exam performed during this visit revealed a 2 cm ulcerative mass on the left tonsil. Accordingly, Dr. Benjamine Mola performed a fiberoptic laryngoscopy during this visit which again  revealed a 2 cm left tonsillar mass. No other abnormalities were appreciated.   Biopsy collected of the left tonsillar mass by Dr. Benjamine Mola on 09/30/21 revealed: invasive squamous cell carcinoma with ulceration and necrosis; p16 negative. Microscopic comment noted the tumor to extend the entire 6 mm linear extent of the biopsy and involve the deep aspect.  Pertinent imaging thus far includes PET performed on 10/18/21 revealing a hypermetabolic mass centered along the wall of the left posterolateral oropharynx; consistent with known tonsillar neoplasm. Hypermetabolic bilateral cervical lymph nodes were also appreciated (including RP node), and were noted as suspicious for disease involvement. No evidence of hypermetabolic metastatic disease in the chest, abdomen, or pelvis were otherwise appreciated. I personally reviewed his imaging with him today.  Swallowing issues, if any: dysphagia and odynophagia  Weight Changes: none  Pain status: left sided throat pain  Other symptoms: N/A  Tobacco history, if any: smoking 1ppd for about 20 years, at least 1/2 a pack a day recently  ETOH abuse, if any: drinks alcohol, denies excess  Prior cancers, if any: none  He lives in [place]. Worked for a [employer] with a lot of travel worldwide. He loved [place] and [place].  PREVIOUS RADIATION THERAPY: No  PAST MEDICAL HISTORY:  has a past medical history of Acute kidney failure ([place]) (04/2018), CKD (chronic kidney disease), stage II, Complication of anesthesia, DDD (degenerative disc disease), lumbar, History of pancreatitis, History of venomous snake bite, Hypercholesterolemia, Hypertension, Nerve pain, PONV (postoperative nausea and vomiting), S/P insertion of spinal  cord stimulator (08/2017), Spinal stenosis, and Tobacco abuse.    PAST SURGICAL HISTORY: Past Surgical History:  Procedure Laterality Date   COLONOSCOPY     FINGER EXPLORATION  05/2011   rt index finger [id number]   HERNIA REPAIR      as a child   I & D EXTREMITY  11/24/2011   Procedure: IRRIGATION AND DEBRIDEMENT EXTREMITY;  Surgeon: Tennis Must, MD;  Location: [facility];  Service: Orthopedics;  Laterality: Right;  right index   MASS EXCISION  01/23/2012   Procedure: EXCISION MASS;  Surgeon: Tennis Must, MD;  Location: [facility];  Service: Orthopedics;  Laterality: Right;  Right index a-cell graft   TOTAL HIP ARTHROPLASTY Right 05/03/2018   Procedure: RIGHT TOTAL HIP ARTHROPLASTY ANTERIOR APPROACH;  Surgeon: Rod Can, MD;  Location: [facility];  Service: Orthopedics;  Laterality: Right;  Needs RNFA    FAMILY HISTORY: family history includes Diabetes in his mother; Throat cancer in his brother.  SOCIAL HISTORY:  reports that he has been smoking cigarettes. He has a 10.00 pack-year smoking history. He has never used smokeless tobacco. He reports current alcohol use. He reports that he does not use drugs.  ALLERGIES: Codeine  MEDICATIONS:  Current Outpatient Medications  Medication Sig Dispense Refill   Multiple Vitamins-Minerals (CENTRUM ULTRA MENS) TABS Take 1 tablet by mouth daily.       olmesartan-hydrochlorothiazide (BENICAR HCT) 40-25 MG per tablet Take 1 tablet by mouth daily.       rosuvastatin (CRESTOR) 10 MG tablet Take 10 mg by mouth daily.      No current facility-administered medications for this encounter.    REVIEW OF SYSTEMS:  Notable for that above.   PHYSICAL EXAM:  height is 5\' 8"  (1.727 m) and weight is 159 lb 4 oz (72.2 kg). His temporal temperature is 96.9 F (36.1 C) (abnormal). His blood pressure is 124/83 and his pulse is 106 (abnormal). His respiration is 18 and oxygen saturation is 100%.   General: Alert and oriented, in no acute distress HEENT: Head is normocephalic. Extraocular movements are intact. Oropharynx is notable for exophytic mass arising from left tonsil, estimated to be just over 2cm. No trismus. Tongue midline Neck: Neck is notable for no  obvious palpable masses Heart: Regular in rate and rhythm with no murmurs, rubs, or gallops. Chest: Clear to auscultation bilaterally, with no rhonchi, wheezes, or rales. Abdomen: Soft, nontender, nondistended, with no rigidity or guarding. Extremities: No cyanosis or edema. Lymphatics: see Neck Exam Skin: No concerning lesions. Musculoskeletal: symmetric strength and muscle tone throughout. Neurologic: Cranial nerves II through XII are grossly intact. No obvious focalities. Speech is fluent. Coordination is intact. Psychiatric: Judgment and insight are intact. Affect is appropriate.   ECOG = 1  0 - Asymptomatic (Fully active, able to carry on all predisease activities without restriction)  1 - Symptomatic but completely ambulatory (Restricted in physically strenuous activity but ambulatory and able to carry out work of a light or sedentary nature. For example, light housework, office work)  2 - Symptomatic, <50% in bed during the day (Ambulatory and capable of all self care but unable to carry out any work activities. Up and about more than 50% of waking hours)  3 - Symptomatic, >50% in bed, but not bedbound (Capable of only limited self-care, confined to bed or chair 50% or more of waking hours)  4 - Bedbound (Completely disabled. Cannot carry on any self-care. Totally confined to bed or chair)  5 - Death   Eustace Pen MM, Creech RH, Tormey DC, et al. [phone]). "Toxicity and response criteria of the [facility] Group". [place] Oncol. 5 (6): 649-55   LABORATORY DATA:  Lab Results  Component Value Date   WBC 7.0 10/25/2021   HGB 15.1 10/25/2021   HCT 43.3 10/25/2021   MCV 97.7 10/25/2021   PLT 197 10/25/2021   CMP     Component Value Date/Time   NA 134 (L) 10/25/2021 1053   K 4.3 10/25/2021 1053   CL 105 10/25/2021 1053   CO2 21 (L) 10/25/2021 1053   GLUCOSE 106 (H) 10/25/2021 1053   BUN 11 10/25/2021 1053   CREATININE 0.61 10/25/2021 1053   CALCIUM 9.1  10/25/2021 1053   PROT 7.3 10/25/2021 1053   ALBUMIN 3.9 10/25/2021 1053   AST 72 (H) 10/25/2021 1053   ALT 48 (H) 10/25/2021 1053   ALKPHOS 91 10/25/2021 1053   BILITOT 1.0 10/25/2021 1053   GFRNONAA >60 10/25/2021 1053   GFRAA >60 05/04/2018 0536      No results found for: TSH   RADIOGRAPHY: NM PET Image Initial (PI) Skull Base To Thigh (F-18 FDG)  Result Date: 10/18/2021 CLINICAL DATA:  Initial treatment strategy for tonsillar cancer. EXAM: NUCLEAR MEDICINE PET SKULL BASE TO THIGH TECHNIQUE: 7.6 mCi F-18 FDG was injected intravenously. Full-ring PET imaging was performed from the skull base to thigh after the radiotracer. CT data was obtained and used for attenuation correction and anatomic localization. Fasting blood glucose: 135 mg/dl COMPARISON:  CT April 07, 2021. FINDINGS: Mediastinal blood pool activity: SUV max 2.01 Liver activity: SUV max NA NECK: Hypermetabolic mass centered along the wall of the left posterolateral oropharynx measuring approximally 20 x 15 mm on image 27/4 with a max SUV of 12.76. Hypermetabolic prominent bilateral cervical lymph nodes. For reference: -hypermetabolic left level 2a lymph node measures 8 mm on image 35/4 with a max SUV of 6.3. -hypermetabolic right level 2a lymph node measures 6 mm on image 32/4 with a max SUV of 4.4. Incidental CT findings: Streak artifact from dental hardware. Mucosal thickening of the right maxillary sinus. CHEST: No hypermetabolic mediastinal or hilar nodes. No suspicious pulmonary nodules on the CT scan. Incidental CT findings: No suspicious pulmonary nodules. No pleural effusion. No pneumothorax. Aortic and branch vessel atherosclerosis without thoracic aortic aneurysm. ABDOMEN/PELVIS: No abnormal hypermetabolic activity within the liver, pancreas, adrenal glands, or spleen. No hypermetabolic lymph nodes in the abdomen or pelvis. Incidental CT findings: Diffuse hepatic steatosis with focal fatty sparing along the gallbladder fossa.  Gallbladder is unremarkable. No biliary ductal dilation. Atrophic pancreas with numerous calcifications throughout the gland, consistent with sequela of chronic pancreatitis. No hydronephrosis. No evidence of bowel obstruction or acute bowel inflammation. Aortic and branch vessel atherosclerosis without abdominal aortic aneurysm. Left posterior paraspinal stimulator generator with lead tip in the spinal canal. SKELETON: No focal hypermetabolic activity to suggest skeletal metastasis. Incidental CT findings: Multilevel degenerative changes spine. Degenerative change of the SI joints with partial bony ankylosis. Right total hip arthroplasty. No aggressive lytic or blastic lesion of bone. IMPRESSION: 1. Hypermetabolic mass centered along the wall of the left posterolateral oropharynx, consistent with known tonsillar neoplasm. 2. Hypermetabolic bilateral cervical lymph nodes, suspicious for disease involvement. 3. No evidence of hypermetabolic metastatic disease in the chest, abdomen, or pelvis. 4.   Additional ancillary chronic findings described above. Electronically Signed   By: Dahlia Bailiff M.D.   On: 10/18/2021 10:24  IMPRESSION/PLAN:  This is a delightful patient with locally advanced p16 neg head and neck cancer of the tonsil with bilateral pathologic neck nodes including retropharyngeal adenopathy. I  recommend radiotherapy for this patient. Concurrent chemotherapy as well, pending consultation with med/onc.  I explained why I think upfront surgery is not in his best interest, especially given the extent of adenopathy. There is a high likelihood we would recommend ChRT after surgery, and that would subject him to the side effects of trimodality therapy.  He is nevertheless adamant to receive the opinion of a Conservation officer, historic buildings before planning ChRT. Anderson Malta our Navigator will try to get an appt for him ASAP. He understands this will delay his start of therapy somewhat, and the risks of  delaying treatment.   We discussed the potential risks, benefits, and side effects of radiotherapy. We talked in detail about acute and late effects. We discussed that some of the most bothersome acute effects may be mucositis, dysgeusia, salivary changes, skin irritation, hair loss, dehydration, weight loss and fatigue. We talked about late effects which include but are not necessarily limited to dysphagia, hypothyroidism, nerve injury, vascular injury, spinal cord injury, xerostomia, trismus, neck edema, and potential injury to any of the tissues in the head and neck region. No guarantees of treatment were given. A consent form was signed and placed in the patient's medical record. The patient is interested in proceeding with treatment but wants another ENT opinion first (see above). I look forward to participating in the patient's care.    Simulation (treatment planning) will take place after clearance by ENT, dentistry.  We also discussed that the treatment of head and neck cancer is a multidisciplinary process to maximize treatment outcomes and quality of life. For this reason the following referrals have been or will be made:   Medical oncology to discuss chemotherapy    Dentistry for dental evaluation, possible extractions in the radiation fields, and /or advice on reducing risk of cavities, osteoradionecrosis, or other oral issues.   Nutritionist for nutrition support during and after treatment.   Speech language pathology for swallowing and/or speech therapy.   Social work for social support.    Physical therapy due to risk of lymphedema in neck and deconditioning.   Baseline labs including TSH.  I asked the patient today about tobacco use. The patient uses tobacco.  I advised the patient to quit. Services were offered by me today including outpatient counseling and pharmacotherapy. I assessed for the willingness to attempt to quit and provided encouragement and demonstrated  willingness to make referrals and/or prescriptions to help the patient attempt to quit. The patient has follow-up with the oncologic team to touch base on their tobacco use and /or cessation efforts.  Over 3 minutes were spent on this issue. He is not motivated to set a quit day yet but materials were offered to him w/ community support. Advised him to cut back on ETOH as well as this could also have contributed to his cancer development.   On date of service, in total, I spent 65 minutes on this encounter. Patient was seen in person.  __________________________________________   Eppie Gibson, MD  This document serves as a record of services personally performed by Eppie Gibson, MD. It was created on her behalf by Roney Mans, a trained medical scribe. The creation of this record is based on the scribe's personal observations and the provider's statements to them. This document has been checked and approved by the attending provider.

## 2021-10-25 ENCOUNTER — Encounter: Payer: Self-pay | Admitting: Hematology and Oncology

## 2021-10-25 ENCOUNTER — Other Ambulatory Visit: Payer: Self-pay | Admitting: *Deleted

## 2021-10-25 ENCOUNTER — Ambulatory Visit
Admission: RE | Admit: 2021-10-25 | Discharge: 2021-10-25 | Disposition: A | Discharge status: Home / Self Care | Payer: No Typology Code available for payment source | Source: Ambulatory Visit | Attending: Radiation Oncology | Admitting: Radiation Oncology

## 2021-10-25 ENCOUNTER — Inpatient Hospital Stay: Payer: No Typology Code available for payment source

## 2021-10-25 ENCOUNTER — Inpatient Hospital Stay
Payer: No Typology Code available for payment source | Attending: Hematology and Oncology | Admitting: Hematology and Oncology

## 2021-10-25 ENCOUNTER — Encounter: Payer: Self-pay | Admitting: Radiation Oncology

## 2021-10-25 ENCOUNTER — Other Ambulatory Visit: Payer: Self-pay

## 2021-10-25 VITALS — BP 127/73 | HR 70 | Temp 97.9°F | Resp 18 | Ht 68.0 in | Wt 159.0 lb

## 2021-10-25 VITALS — BP 124/83 | HR 106 | Temp 96.9°F | Resp 18 | Ht 68.0 in | Wt 159.2 lb

## 2021-10-25 DIAGNOSIS — M5136 Other intervertebral disc degeneration, lumbar region: Secondary | ICD-10-CM | POA: Diagnosis not present

## 2021-10-25 DIAGNOSIS — F17210 Nicotine dependence, cigarettes, uncomplicated: Secondary | ICD-10-CM | POA: Insufficient documentation

## 2021-10-25 DIAGNOSIS — I129 Hypertensive chronic kidney disease with stage 1 through stage 4 chronic kidney disease, or unspecified chronic kidney disease: Secondary | ICD-10-CM | POA: Insufficient documentation

## 2021-10-25 DIAGNOSIS — Z801 Family history of malignant neoplasm of trachea, bronchus and lung: Secondary | ICD-10-CM | POA: Diagnosis not present

## 2021-10-25 DIAGNOSIS — C099 Malignant neoplasm of tonsil, unspecified: Secondary | ICD-10-CM

## 2021-10-25 DIAGNOSIS — Z808 Family history of malignant neoplasm of other organs or systems: Secondary | ICD-10-CM | POA: Insufficient documentation

## 2021-10-25 DIAGNOSIS — C103 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: Secondary | ICD-10-CM | POA: Diagnosis present

## 2021-10-25 DIAGNOSIS — N182 Chronic kidney disease, stage 2 (mild): Secondary | ICD-10-CM | POA: Diagnosis not present

## 2021-10-25 DIAGNOSIS — Z79899 Other long term (current) drug therapy: Secondary | ICD-10-CM | POA: Insufficient documentation

## 2021-10-25 DIAGNOSIS — E7800 Pure hypercholesterolemia, unspecified: Secondary | ICD-10-CM | POA: Diagnosis not present

## 2021-10-25 DIAGNOSIS — K760 Fatty (change of) liver, not elsewhere classified: Secondary | ICD-10-CM | POA: Diagnosis not present

## 2021-10-25 DIAGNOSIS — C109 Malignant neoplasm of oropharynx, unspecified: Secondary | ICD-10-CM | POA: Diagnosis not present

## 2021-10-25 LAB — CMP (CANCER CENTER ONLY)
ALT: 48 U/L — ABNORMAL HIGH (ref 0–44)
AST: 72 U/L — ABNORMAL HIGH (ref 15–41)
Albumin: 3.9 g/dL (ref 3.5–5.0)
Alkaline Phosphatase: 91 U/L (ref 38–126)
Anion gap: 8 (ref 5–15)
BUN: 11 mg/dL (ref 8–23)
CO2: 21 mmol/L — ABNORMAL LOW (ref 22–32)
Calcium: 9.1 mg/dL (ref 8.9–10.3)
Chloride: 105 mmol/L (ref 98–111)
Creatinine: 0.61 mg/dL (ref 0.61–1.24)
GFR, Estimated: >60 mL/min (ref >60)
Glucose, Bld: 106 mg/dL — ABNORMAL HIGH (ref 70–99)
Potassium: 4.3 mmol/L (ref 3.5–5.1)
Sodium: 134 mmol/L — ABNORMAL LOW (ref 135–145)
Total Bilirubin: 1 mg/dL (ref 0.3–1.2)
Total Protein: 7.3 g/dL (ref 6.5–8.1)

## 2021-10-25 LAB — CBC WITH DIFFERENTIAL/PLATELET
Abs Immature Granulocytes: 0.01 10*3/uL (ref 0.00–0.07)
Basophils Absolute: 0 10*3/uL (ref 0.0–0.1)
Basophils Relative: 0 %
Eosinophils Absolute: 0.1 10*3/uL (ref 0.0–0.5)
Eosinophils Relative: 2 %
HCT: 43.3 % (ref 39.0–52.0)
Hemoglobin: 15.1 g/dL (ref 13.0–17.0)
Immature Granulocytes: 0 %
Lymphocytes Relative: 31 %
Lymphs Abs: 2.2 10*3/uL (ref 0.7–4.0)
MCH: 34.1 pg — ABNORMAL HIGH (ref 26.0–34.0)
MCHC: 34.9 g/dL (ref 30.0–36.0)
MCV: 97.7 fL (ref 80.0–100.0)
Monocytes Absolute: 0.8 10*3/uL (ref 0.1–1.0)
Monocytes Relative: 11 %
Neutro Abs: 3.9 10*3/uL (ref 1.7–7.7)
Neutrophils Relative %: 56 %
Platelets: 197 10*3/uL (ref 150–400)
RBC: 4.43 MIL/uL (ref 4.22–5.81)
RDW: 12 % (ref 11.5–15.5)
WBC: 7 10*3/uL (ref 4.0–10.5)
nRBC: 0 % (ref 0.0–0.2)

## 2021-10-25 LAB — MAGNESIUM: Magnesium: 2 mg/dL (ref 1.7–2.4)

## 2021-10-25 NOTE — Progress Notes (Signed)
Oncology Nurse Navigator Documentation   Met with patient during initial consult with Dr. Eppie Gibson.  Further introduced myself as his Navigator, explained my role as a member of the Care Team. Provided New Patient Information packet: Contact information for physician, this navigator, other members of the Care Team Advance Directive information ([place] blue pamphlet with LCSW insert); provided [facility] AD booklet at his request,  Fall Prevention Patient [place] Information sheet Symptom Management Clinic information [facility] campus map with highlight of [place] SLP Information sheet Provided and discussed educational handouts for PEG and PAC. Assisted with post-consult appt scheduling. Dr. Isidore Moos has referred him to an ENT surgeon at [facility] at Mr. Hardeman's request. I'm working on the referral and getting it scheduled as soon as possible.  I explained the location of Dr. Raynelle Dick office and [facility] Radiology as reference for future appts, including arrival procedure for these appts.   He verbalized understanding of information provided. I encouraged him to call with questions/concerns moving forward.  Harlow Asa, RN, BSN, OCN Head & Neck Oncology Nurse [place] at [place] [phone]

## 2021-10-25 NOTE — Progress Notes (Signed)
[place] CONSULT NOTE  Patient Care Team: [name], MD as PCP - General (Family Medicine)  CHIEF COMPLAINTS/PURPOSE OF CONSULTATION:  SCC oropharynx  ASSESSMENT & PLAN:  Squamous cell carcinoma of left tonsil [facility]) This is a very pleasant 66 year old male patient, current everyday smoker with newly diagnosed squamous cell carcinoma of the left tonsil, p16 negative, staging T1 versus T2, borderline size, and N2 with bilateral cervical lymphadenopathy which are hypermetabolic on PET imaging and M0 with no evidence of distant metastasis referred to medical oncology for consideration of concurrent chemotherapy and radiation.  Patient arrived to the appointment today by himself.  He is doing well except for chronic sore throat.  No other major concerning symptoms today.  Physical examination today without any palpable cervical lymphadenopathy.  We have discussed his imaging findings and given bilateral cervical lymphadenopathy, we have discussed about considering concurrent chemotherapy and radiation as frontline treatment.  He wanted to have another surgical opinion from [facility].  He is however willing to do chemotherapy and radiation locally if this was the recommendation from [facility].  I think this is a very reasonable request.  Referral placed to [facility], navigated by her navigator [place]. At this time I have discussed about weekly cisplatin, mechanism of action, adverse effects of cisplatin including but not limited to fatigue, nausea, vomiting, increased risk of infections, ototoxicity, nephrotoxicity and peripheral neuropathy.  He understands that some of the side effects can be permanent and life-threatening.  He is willing to consider chemotherapy, PEG placement and port placement but would like to seek a second opinion before this. If he decides to come back to [place] due to concurrent chemotherapy and radiation, he should let Anderson Malta our nurse navigator know so we  can make appropriate arrangements.  He expressed understanding of all the recommendations.  All his questions were answered to the best of my knowledge. Thank you for consulting [place] the care of this patient.  Please do not hesitate to contact us with any additional questions or concerns.  Orders Placed This Encounter  Procedures   CBC with Differential/Platelet    Standing Status:   Standing    Number of Occurrences:   22    Standing Expiration Date:   10/25/2022   Comprehensive metabolic panel    Standing Status:   Standing    Number of Occurrences:   33    Standing Expiration Date:   10/25/2022     HISTORY OF PRESENTING ILLNESS:  [name] [name] 66 y.o. male is here because of SCC oropharynx.  This is a very pleasant 66 year old male patient with past medical history significant for hypertension, dyslipidemia referred to medical oncology with new diagnosis of squamous cell carcinoma of the tonsil.  He was seen by Dr. P.o. for chief complaint of sore throat.  Back in July when he had a CT neck, there was no identifiable neck mass or enlarged nodes. He continues to have sore throat and most recently had a left tonsil biopsy which showed invasive squamous cell carcinoma with ulceration and necrosis, p16 immunostain is negative. He then had a PET/CT on 10/18/2018 which showed a hypermetabolic mass centered along the wall of the left posterior lateral oropharynx consistent with known tonsillar neoplasm, hypermetabolic bilateral cervical lymph nodes suspicious for disease involvement.  No evidence of hypermetabolic disease in the chest abdomen or pelvis. Besides sore throat, he denies any difficulty swallowing.  He has lost about 50 pounds of weight however this is  mostly intentional in the past couple years.  He denies any hearing loss.  He has followed up with Dr. Isidore Moos recently and recommendation was to consider concurrent chemotherapy and radiation with curative intent, patient is really hoping to  get another opinion from surgery team at [facility] before he proceeds with concurrent chemotherapy and radiation.  He is willing to undergo chemotherapy and radiation locally if that is the recommendation from [facility].  He is retired for the past 10 years, lives in [place].  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  He is a smoker, has been smoking half pack a day for the past 20 years or more.  He is otherwise healthy.  Rest of the pertinent 10 point ROS reviewed and negative.  MEDICAL HISTORY:  Past Medical History:  Diagnosis Date   Acute kidney failure ([place]) 04/2018   CKD (chronic kidney disease), stage II    Complication of anesthesia    DDD (degenerative disc disease), lumbar    History of pancreatitis    20-30 years ago   History of venomous snake bite    rt index finger-   Hypercholesterolemia    Hypertension    Nerve pain    Right leg, resolved with stimulator   PONV (postoperative nausea and vomiting)    S/P insertion of spinal cord stimulator 08/2017   Spinal stenosis    Tobacco abuse     SURGICAL HISTORY: Past Surgical History:  Procedure Laterality Date   COLONOSCOPY     FINGER EXPLORATION  05/2011   rt index finger [id number]   HERNIA REPAIR     as a child   I & D EXTREMITY  11/24/2011   Procedure: IRRIGATION AND DEBRIDEMENT EXTREMITY;  Surgeon: Tennis Must, MD;  Location: [place];  Service: Orthopedics;  Laterality: Right;  right index   MASS EXCISION  01/23/2012   Procedure: EXCISION MASS;  Surgeon: Tennis Must, MD;  Location: [place];  Service: Orthopedics;  Laterality: Right;  Right index a-cell graft   TOTAL HIP ARTHROPLASTY Right 05/03/2018   Procedure: RIGHT TOTAL HIP ARTHROPLASTY ANTERIOR APPROACH;  Surgeon: Rod Can, MD;  Location: [facility];  Service: Orthopedics;  Laterality: Right;  Needs RNFA    SOCIAL HISTORY: Social History    Socioeconomic History   Marital status: Married    Spouse name: Not on file   Number of children: Not on file   Years of education: Not on file   Highest education level: Not on file  Occupational History   Not on file  Tobacco Use   Smoking status: Every Day    Packs/day: 0.50    Years: 20.00    Pack years: 10.00    Types: Cigarettes   Smokeless tobacco: Never  Vaping Use   Vaping Use: Never used  Substance and Sexual Activity   Alcohol use: Yes    Comment: occasional   Drug use: No   Sexual activity: Not Currently  Other Topics Concern   Not on file  Social History Narrative   Not on file   Social Determinants of Health   Financial Resource Strain: Not on file  Food Insecurity: Not on file  Transportation Needs: Not on file  Physical Activity: Not on file  Stress: Not on file  Social Connections: Not on file  Intimate Partner Violence: Not on file    FAMILY HISTORY: Family History  Problem Relation Age  of Onset   Diabetes Mother    Throat cancer Brother     ALLERGIES:  is allergic to codeine.  MEDICATIONS:  Current Outpatient Medications  Medication Sig Dispense Refill   Multiple Vitamins-Minerals (CENTRUM ULTRA MENS) TABS Take 1 tablet by mouth daily.       olmesartan-hydrochlorothiazide (BENICAR HCT) 40-25 MG per tablet Take 1 tablet by mouth daily.       rosuvastatin (CRESTOR) 10 MG tablet Take 10 mg by mouth daily.      No current facility-administered medications for this visit.     PHYSICAL EXAMINATION: ECOG PERFORMANCE STATUS: 0 - Asymptomatic  Vitals:   10/25/21 0957  BP: 127/73  Pulse: 70  Resp: 18  Temp: 97.9 F (36.6 C)  SpO2: 100%   Filed Weights   10/25/21 0957  Weight: 159 lb (72.1 kg)   GENERAL:alert, no distress and comfortable SKIN: skin color, texture, turgor are normal, no rashes or significant lesions EYES: normal, conjunctiva are pink and non-injected, sclera clear OROPHARYNX:no exudate, no erythema and lips,  buccal mucosa, and tongue normal  NECK: supple, thyroid normal size, non-tender, without nodularity LYMPH:  no palpable lymphadenopathy in the cervical, axillary  LUNGS: clear to auscultation and percussion with normal breathing effort HEART: regular rate & rhythm and no murmurs and no lower extremity edema ABDOMEN:abdomen soft, non-tender and normal bowel sounds Musculoskeletal:no cyanosis of digits and no clubbing  PSYCH: alert & oriented x 3 with fluent speech NEURO: no focal motor/sensory deficits  LABORATORY DATA:  I have reviewed the data as listed Lab Results  Component Value Date   WBC 7.0 10/25/2021   HGB 15.1 10/25/2021   HCT 43.3 10/25/2021   MCV 97.7 10/25/2021   PLT 197 10/25/2021     Chemistry      Component Value Date/Time   NA 134 (L) 10/25/2021 1053   K 4.3 10/25/2021 1053   CL 105 10/25/2021 1053   CO2 21 (L) 10/25/2021 1053   BUN 11 10/25/2021 1053   CREATININE 0.61 10/25/2021 1053      Component Value Date/Time   CALCIUM 9.1 10/25/2021 1053   ALKPHOS 91 10/25/2021 1053   AST 72 (H) 10/25/2021 1053   ALT 48 (H) 10/25/2021 1053   BILITOT 1.0 10/25/2021 1053       RADIOGRAPHIC STUDIES: I have personally reviewed the radiological images as listed and agreed with the findings in the report. NM PET Image Initial (PI) Skull Base To Thigh (F-18 FDG)  Result Date: 10/18/2021 CLINICAL DATA:  Initial treatment strategy for tonsillar cancer. EXAM: NUCLEAR MEDICINE PET SKULL BASE TO THIGH TECHNIQUE: 7.6 mCi F-18 FDG was injected intravenously. Full-ring PET imaging was performed from the skull base to thigh after the radiotracer. CT data was obtained and used for attenuation correction and anatomic localization. Fasting blood glucose: 135 mg/dl COMPARISON:  CT April 07, 2021. FINDINGS: Mediastinal blood pool activity: SUV max 2.01 Liver activity: SUV max NA NECK: Hypermetabolic mass centered along the wall of the left posterolateral oropharynx measuring approximally  20 x 15 mm on image 27/4 with a max SUV of 12.76. Hypermetabolic prominent bilateral cervical lymph nodes. For reference: -hypermetabolic left level 2a lymph node measures 8 mm on image 35/4 with a max SUV of 6.3. -hypermetabolic right level 2a lymph node measures 6 mm on image 32/4 with a max SUV of 4.4. Incidental CT findings: Streak artifact from dental hardware. Mucosal thickening of the right maxillary sinus. CHEST: No hypermetabolic mediastinal or hilar nodes. No suspicious  pulmonary nodules on the CT scan. Incidental CT findings: No suspicious pulmonary nodules. No pleural effusion. No pneumothorax. Aortic and branch vessel atherosclerosis without thoracic aortic aneurysm. ABDOMEN/PELVIS: No abnormal hypermetabolic activity within the liver, pancreas, adrenal glands, or spleen. No hypermetabolic lymph nodes in the abdomen or pelvis. Incidental CT findings: Diffuse hepatic steatosis with focal fatty sparing along the gallbladder fossa. Gallbladder is unremarkable. No biliary ductal dilation. Atrophic pancreas with numerous calcifications throughout the gland, consistent with sequela of chronic pancreatitis. No hydronephrosis. No evidence of bowel obstruction or acute bowel inflammation. Aortic and branch vessel atherosclerosis without abdominal aortic aneurysm. Left posterior paraspinal stimulator generator with lead tip in the spinal canal. SKELETON: No focal hypermetabolic activity to suggest skeletal metastasis. Incidental CT findings: Multilevel degenerative changes spine. Degenerative change of the SI joints with partial bony ankylosis. Right total hip arthroplasty. No aggressive lytic or blastic lesion of bone. IMPRESSION: 1. Hypermetabolic mass centered along the wall of the left posterolateral oropharynx, consistent with known tonsillar neoplasm. 2. Hypermetabolic bilateral cervical lymph nodes, suspicious for disease involvement. 3. No evidence of hypermetabolic metastatic disease in the chest,  abdomen, or pelvis. 4.   Additional ancillary chronic findings described above. Electronically Signed   By: Dahlia Bailiff M.D.   On: 10/18/2021 10:24    All questions were answered. The patient knows to call the clinic with any problems, questions or concerns. I spent 60 minutes in the care of this patient including H and P, review of records, counseling and coordination of care.     Benay Pike, MD 10/25/2021 2:57 PM

## 2021-10-25 NOTE — Assessment & Plan Note (Signed)
This is a very pleasant 66 year old male patient, current everyday smoker with newly diagnosed squamous cell carcinoma of the left tonsil, p16 negative, staging T1 versus T2, borderline size, and N2 with bilateral cervical lymphadenopathy which are hypermetabolic on PET imaging and M0 with no evidence of distant metastasis referred to medical oncology for consideration of concurrent chemotherapy and radiation.  Patient arrived to the appointment today by himself.  He is doing well except for chronic sore throat.  No other major concerning symptoms today.  Physical examination today without any palpable cervical lymphadenopathy.  We have discussed his imaging findings and given bilateral cervical lymphadenopathy, we have discussed about considering concurrent chemotherapy and radiation as frontline treatment.  He wanted to have another surgical opinion from [facility].  He is however willing to do chemotherapy and radiation locally if this was the recommendation from [facility].  I think this is a very reasonable request.  Referral placed to [facility], navigated by her navigator [place]. At this time I have discussed about weekly cisplatin, mechanism of action, adverse effects of cisplatin including but not limited to fatigue, nausea, vomiting, increased risk of infections, ototoxicity, nephrotoxicity and peripheral neuropathy.  He understands that some of the side effects can be permanent and life-threatening.  He is willing to consider chemotherapy, PEG placement and port placement but would like to seek a second opinion before this. If he decides to come back to [place] due to concurrent chemotherapy and radiation, he should let Anderson Malta our nurse navigator know so we can make appropriate arrangements.  He expressed understanding of all the recommendations.  All his questions were answered to the best of my knowledge. Thank you for consulting [place] the care of this patient.  Please do not hesitate to contact us  with any additional questions or concerns.

## 2021-10-26 ENCOUNTER — Ambulatory Visit (INDEPENDENT_AMBULATORY_CARE_PROVIDER_SITE_OTHER): Payer: No Typology Code available for payment source | Admitting: Dentistry

## 2021-10-26 ENCOUNTER — Encounter (HOSPITAL_COMMUNITY): Payer: Self-pay | Admitting: Dentistry

## 2021-10-26 ENCOUNTER — Encounter: Payer: Self-pay | Admitting: Radiation Oncology

## 2021-10-26 VITALS — BP 120/85 | HR 70 | Temp 98.4°F

## 2021-10-26 DIAGNOSIS — K0530 Chronic periodontitis, unspecified: Secondary | ICD-10-CM

## 2021-10-26 DIAGNOSIS — C103 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: Secondary | ICD-10-CM | POA: Insufficient documentation

## 2021-10-26 DIAGNOSIS — F40232 Fear of other medical care: Secondary | ICD-10-CM

## 2021-10-26 DIAGNOSIS — M270 Developmental disorders of jaws: Secondary | ICD-10-CM

## 2021-10-26 DIAGNOSIS — K030 Excessive attrition of teeth: Secondary | ICD-10-CM

## 2021-10-26 DIAGNOSIS — K06020 Generalized gingival recession, unspecified: Secondary | ICD-10-CM

## 2021-10-26 DIAGNOSIS — C099 Malignant neoplasm of tonsil, unspecified: Secondary | ICD-10-CM

## 2021-10-26 DIAGNOSIS — K036 Deposits [accretions] on teeth: Secondary | ICD-10-CM

## 2021-10-26 DIAGNOSIS — K08109 Complete loss of teeth, unspecified cause, unspecified class: Secondary | ICD-10-CM

## 2021-10-26 DIAGNOSIS — Z01818 Encounter for other preprocedural examination: Secondary | ICD-10-CM

## 2021-10-26 DIAGNOSIS — K029 Dental caries, unspecified: Secondary | ICD-10-CM

## 2021-10-26 NOTE — Patient Instructions (Signed)
[place] [facility] of Dental Medicine Dr. Debe Coder B. Benson [place], D.M.D. Phone: [phone] Fax: [phone]      It was a pleasure seeing you today!  Please refer to the information below regarding your dental visit with [place].  Please do not hesitate to give [place] a call if any questions or concerns come up after you leave.    Thank you for letting us provide care for you.  If there is anything we can do for you, please let us know.     RADIATION THERAPY AND INFORMATION REGARDING YOUR TEETH  XEROSTOMIA (dry mouth):  Your salivary glands may be in the field of radiation.  Radiation may include all or only part of your salivary glands.  This will cause your saliva to dry up, and you will have a dry mouth.  The dry mouth will be for the rest of your life unless your radiation oncologist tells you otherwise.       Your saliva has many functions: It wets your tongue for speaking. It coats your teeth and the inside of your mouth for easier movement. It helps with chewing and swallowing food. It helps clean away harmful acid and toxic products made by the germs in your mouth, therefore it helps prevent cavities. It kills some germs in your mouth and helps to prevent gum disease. It helps to carry flavor to your taste buds.       Once you have lost your saliva, you will be at higher risk for tooth decay and gum disease.         What can be done to help improve your mouth when there's not enough saliva? Your dentist may give a recommendation for CLoSYS.  It will not bring back all of your saliva but may bring back some of it.  Also, your saliva may be thick and ropy or white and foamy.  It will not feel like it use to feel. You will need to swish with water every time your mouth feels dry.  YOU CANNOT suck on any cough drops, mints, lemon drops, candy, vitamin C or any other products.  You cannot use anything other than water to make your mouth feel less dry.  If you  want to drink anything else, you have to drink it all at once and brush afterwards.  Be sure to discuss the details of your diet habits with your dentist or hygienist.   RADIATION CARIES:  This is decay (cavities) that happens very quickly  once your mouth is very dry due to radiation therapy.  Normally, cavities take six months to two years to become a problem.  When you have dry mouth, cavities may take as little as eight weeks to cause you a problem.    Dental check-ups every 2-4 months are necessary as long as you have a dry mouth.  Radiation caries typically, but not always, starts at your gum line where it is hard to see the cavity.  It is therefore also hard to fill these cavities adequately.  This high rate of cavities happens because your mouth no longer has saliva and therefore the acid made by the germs starts the decay process.  Whenever you eat anything the germs in your mouth change the food into acid.  The acid then burns a small hole in your tooth.  This small hole is the beginning of a cavity.  If this is not treated then it will grow bigger and become a  cavity.  The way to avoid this hole getting bigger is to use fluoride every evening as prescribed by your dentist following your radiation. NOTE:  You have to make sure that your teeth are very clean before you use the fluoride.  This fluoride in turn will strengthen your teeth and prepare them for another day of fighting acid. If you develop radiation caries many times, the damage is so large that you will have to have all your teeth removed.  This could be a big problem if some of these teeth are in the field of radiation.  Further details of why this could be a big problem will follow (see Osteoradionecrosis below).   DYSGEUSIA (loss of taste):  This happens to varying degrees once you've had radiation therapy to your jaw region.  Many times taste is not completely lost, but becomes limited.  The loss of taste is mostly due to radiation  affecting your taste buds.  However, if you have no saliva in your mouth to carry the flavor to your taste buds, it would be difficult for your taste buds to taste anything.  That is why using water or a prescription for [place] prior to meals and during meal times may help with some of the taste.  Keep in mind that taste generally returns very slowly over the course of several months or several years after radiation therapy.  Don't give up hope.   TRISMUS (limited jaw opening):  According to your radiation oncologist, your TMJ or jaw joints are going to be partially or fully in the field of radiation.  This means that over time the muscles that help you open and close your mouth may get stiff.  This will potentially result in your not being able to open your mouth wide enough or as wide as you can open it now.         Let me give you an example of how slowly this happens and how unaware people are of it:    A gentlemen that had radiation therapy 2 years ago went back to his dentist complaining that "bananas were just too large for him to be able to fit them in between his teeth."  He was no longer able to open wide enough to bite into a banana.  This happened slowly and over a period of time.       What we do to try and prevent this:   Your dentist will probably give you a stack of sticks called a trismus exercise device.  This stack will help remind your muscles and your jaw joints to open up to the same distance every day.  Use these sticks every morning when you wake up, or according to the instructions given by your dentist.    You must use these sticks for at least one to two years after radiation therapy.  The reason for that is because it happens so slowly and keeps going on for about two years after radiation therapy.  Your hospital dentist will help you monitor your mouth opening and make sure that it's not getting smaller after radiation.     TRISMUS EXERCISES: Using the stack of sticks given  to you by your dentist, place the stack in your mouth and hold onto the other end for support. Leave the sticks in your mouth while holding the other end.  Allow 30 seconds for muscle stretching. Rest for a few seconds. Repeat 3-5 times. This exercise is recommended in the  mornings and evenings unless otherwise instructed. The exercise should be done for a period of 2 YEARS after the end of radiation. Your maximum jaw opening should be checked regularly at recall dental visits by your general dentist. You should report any changes, soreness, or difficulties encountered when doing the exercises to your dentist.   OSTEORADIONECROSIS (ORN):  This is a condition where your jaw bone after radiation therapy becomes very dry.  It has very little blood supply to keep it alive.  If you develop a cavity that turns into an abscess or an infection, then the jaw bone does not have enough blood supply to help fight the infection.  At this point it is very likely that the infection could cause the death of your jaw bone.  When you have dead bone it has to be removed.  Therefore, you might end up having to have surgery to remove part of your jaw bone, the part of the jaw bone that has been affected.     Healing is also a problem if you are to have surgery (like a tooth extraction) in the areas where the bone has had radiation therapy.  If you have surgery, you need more blood supply to heal which is not available.  When blood supply and oxygen are not available, there is a chance for the bone to die. Occasionally, ORN happens on its own with no obvious reason, but this is quite rare.  We believe that patients who continue to smoke and/or drink alcohol have a higher chance of having this problem. Once your jaw bone has had radiation therapy, if there are any remaining teeth in that area, it is not recommended to have them pulled unless your dentist or oral surgeon is aware of your history of radiation and believes it  is safe.  The risks for ORN either from infection or spontaneously occurring (with no reason) are life long.    Questions?  Call our office during office hours at [phone].

## 2021-10-26 NOTE — Progress Notes (Signed)
Department of Dental Medicine   Service Date:   10/26/2021  Patient Name:  [name] [name] Date of Birth:   May 07, 1956 Medical Record Number: [id number]  Referring Provider:           Eppie Gibson, M.D.   OUTPATIENT CONSULTATION PLAN/RECOMMENDATIONS   ASSESSMENT: There are no current signs of acute odontogenic infection including abscess, edema or erythema, or suspicious lesion requiring biopsy.   Caries, accretions; periodontal concerns  RECOMMENDATIONS: No dental intervention indicated prior to starting radiation at this time.   PLAN: Discuss case with medical team and coordinate treatment as needed. Follow-up after completion of radiation therapy.  Discussed in detail all treatment options and recommendations with the patient and they are agreeable to the plan.    Thank you for consulting with [facility] and for the opportunity to participate in this patient's treatment.  Should you have any questions or concerns, please contact the [facility] at [phone].       10/26/2021 CONSULT NOTE:    COVID-19 SCREENING:  The patient denies symptoms concerning for COVID-19 infection including fever, chills, cough, or newly developed shortness of breath.   HISTORY OF PRESENT ILLNESS: [name] [name] is a very pleasant 66 y.o. male with h/o hypertension, hyperlipidemia, spinal stenosis/degenerative disc disease, nerve pain, tobacco use (cigarettes- current smoker, 20+ years) and chronic kidney disease (stage 2) who was recently diagnosed with left tonsil cancer and is anticipating head and neck radiation.  The patient presents today for a medically necessary dental consultation as part of their pre-radiation therapy work-up.   DENTAL HISTORY: The patient reports that his primary dentist is Dr. Augustina Mood in [place], however it has been about 5 years since he has last seen her due to issues with chronic back pain.  He currently denies any dental/orofacial  pain or sensitivity. Patient is able to manage oral secretions.  Patient denies dysphagia, dysphonia.  Patient endorses odynophagia.  Patient denies fever, rigors and malaise.   CHIEF COMPLAINT:  Here for a pre-head and neck radiation dental exam.   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 ([place]) 10/26/2021   Squamous cell carcinoma of left tonsil ([place]) 10/25/2021   Avascular necrosis of hip, right ([place]) 05/03/2018   Tobacco abuse 12/12/2011   Cellulitis of second finger of right hand 05/18/2011   Cellulitis and abscess of finger 05/18/2011   Cellulitis of hand 05/18/2011   Cellulitis of index finger 05/18/2011   Snake bite poisoning 05/17/2011   Swelling of second finger of right hand 05/17/2011   Pain in finger of right hand 05/17/2011   HTN (hypertension) 05/17/2011   Hyperlipidemia 05/17/2011   FATTY LIVER DISEASE 12/04/2008   ABDOMINAL PAIN-RUQ 12/04/2008   Past Medical History:  Diagnosis Date   Acute kidney failure ([place]) 04/2018   CKD (chronic kidney disease), stage II    Complication of anesthesia    DDD (degenerative disc disease), lumbar    History of pancreatitis    20-30 years ago   History of venomous snake bite    rt index finger-   Hypercholesterolemia    Hypertension    Nerve pain    Right leg, resolved with stimulator   PONV (postoperative nausea and vomiting)    S/P insertion of spinal cord stimulator 08/2017   Spinal stenosis    Tobacco abuse    Past Surgical History:  Procedure Laterality Date   COLONOSCOPY  FINGER EXPLORATION  05/2011   rt index finger [id number]   HERNIA REPAIR     as a child   I & D EXTREMITY  11/24/2011   Procedure: IRRIGATION AND DEBRIDEMENT EXTREMITY;  Surgeon: Tennis Must, MD;  Location: [facility];  Service: Orthopedics;  Laterality: Right;  right index   MASS EXCISION  01/23/2012   Procedure: EXCISION MASS;  Surgeon: Tennis Must, MD;   Location: [facility];  Service: Orthopedics;  Laterality: Right;  Right index a-cell graft   TOTAL HIP ARTHROPLASTY Right 05/03/2018   Procedure: RIGHT TOTAL HIP ARTHROPLASTY ANTERIOR APPROACH;  Surgeon: Rod Can, MD;  Location: [facility];  Service: Orthopedics;  Laterality: Right;  Needs RNFA   Allergies  Allergen Reactions   Codeine Other (See Comments)    "Gives me a headache for days."   Current Outpatient Medications  Medication Sig Dispense Refill   Multiple Vitamins-Minerals (CENTRUM ULTRA MENS) TABS Take 1 tablet by mouth daily.       olmesartan-hydrochlorothiazide (BENICAR HCT) 40-25 MG per tablet Take 1 tablet by mouth daily.       rosuvastatin (CRESTOR) 10 MG tablet Take 10 mg by mouth daily.      No current facility-administered medications for this visit.    LABS: Lab Results  Component Value Date   WBC 7.0 10/25/2021   HGB 15.1 10/25/2021   HCT 43.3 10/25/2021   MCV 97.7 10/25/2021   PLT 197 10/25/2021      Component Value Date/Time   NA 134 (L) 10/25/2021 1053   K 4.3 10/25/2021 1053   CL 105 10/25/2021 1053   CO2 21 (L) 10/25/2021 1053   GLUCOSE 106 (H) 10/25/2021 1053   BUN 11 10/25/2021 1053   CREATININE 0.61 10/25/2021 1053   CALCIUM 9.1 10/25/2021 1053   GFRNONAA >60 10/25/2021 1053   GFRAA >60 05/04/2018 0536   Lab Results  Component Value Date   INR 1.02 05/19/2011   INR 0.93 05/17/2011   INR 0.92 05/17/2011   No results found for: PTT  Social History   Socioeconomic History   Marital status: Married    Spouse name: Not on file   Number of children: Not on file   Years of education: Not on file   Highest education level: Not on file  Occupational History   Not on file  Tobacco Use   Smoking status: Every Day    Packs/day: 0.50    Years: 20.00    Pack years: 10.00    Types: Cigarettes   Smokeless tobacco: Never  Vaping Use   Vaping Use: Never used  Substance and Sexual Activity   Alcohol use: Yes    Comment:  occasional   Drug use: No   Sexual activity: Not Currently  Other Topics Concern   Not on file  Social History Narrative   Not on file   Social Determinants of Health   Financial Resource Strain: Not on file  Food Insecurity: Not on file  Transportation Needs: Not on file  Physical Activity: Not on file  Stress: Not on file  Social Connections: Not on file  Intimate Partner Violence: Not on file   Family History  Problem Relation Age of Onset   Diabetes Mother    Throat cancer Brother      REVIEW OF SYSTEMS:  Reviewed with the patient as per HPI. Psych:  (+) Dental phobia   VITAL SIGNS: BP 120/85 (BP Location: Right Arm, Patient Position: Sitting,  Cuff Size: Normal)    Pulse 70    Temp 98.4 F (36.9 C) (Oral)    PHYSICAL EXAM: General:  Well-developed, comfortable and in no apparent distress. Neurological:  Alert and oriented to person, place and  time. Extraoral:  Facial symmetry present without any edema or erythema.  No swelling.  TMJ asymptomatic without clicks or crepitations.    Maximum Interincisal Opening:  50 mm Intraoral:  Soft tissues appear well-perfused and mucous membranes moist.  FOM and vestibules soft and not raised. Oral cavity without mass or lesion. No signs of infection, parulis, sinus tract, edema or erythema evident upon exam.  Bilateral mandibular tori.   DENTAL EXAM: Hard tissue exam completed and charted.    Overall impression:  Fair remaining dentition.      Oral hygiene:  Poor    Periodontal:  Pink, healthy gingival tissue with blunted papilla with areas of inflamed, erythematous tissue.  Generalized plaque and calculus accumulation. (+) Probing depths (mm):  #31-DB DL L: 7 7 6  (+) Recession: Generalized, mild Caries:  #2, #4, #5, #6, #11, #12, #13, #14, #20, #21, #22, #23, #28, #29  Endodontics:   #19 previously RCT Removable/fixed prosthodontics:  #3, #19 and #30 have full-coverage PFM crowns; #18 has full-coverage gold crown.   Occlusion:  Class 1 molar occlusion Other findings:   (+) Attrition/wear:  #7-#10 incisal; #22-#27 incisal   RADIOGRAPHIC EXAM:  PAN and Full Mouth Series (7 Periapical's and 2 Bitewings- the patient was unable to tolerate all radiographs) exposed and interpreted.      Condyles seated bilaterally in fossas.  No evidence of abnormal pathology.  All visualized osseous structures appear WNL. Missing teeth #'s 1, 16, 17 & 32.      Generalized mild horizontal bone loss with areas of localized moderate consistent with mild-moderate periodontitis.  Radiographic calculus evident.     Existing crowns on #'s 3, 18, 19 & 30.  Existing restoration on #31.  #19 has been previously endodontically treated with adequate appearing gutta percha filling to ~2 mm short of apices.  Caries:  #53M, #79M&D, #13M&D, #56M&D, #11D, #153M&D, #13D, #179M&D, #58M&D, #22M, #22D, #67M, #14M&D & #29D.   ASSESSMENT:  1.  Tonsil cancer 2.  Pre-head and neck radiation dental exam 3.  Missing teeth 4.  Caries 5.  Accretions on teeth 6.  Chronic periodontitis 7.  Attrition/wear 8.  Gingival recession, generalized 9.  Mandibular tori 10.  Dental Phobia   PROCEDURES: The common and significant side effects of radiation therapy to the head and neck were explained and discussed with the patient.  The discussion included side effects of trismus (limited opening), dysgeusia (loss of taste), xerostomia (dry mouth), radiation caries and osteoradionecrosis of the jaw.  I also discussed the importance of maintaining optimal oral hygiene and oral health before, during and after radiation to decrease the risk of developing radiation cavities and the need for any surgery such as extractions after therapy.    Trismus appliance made using patient's baseline MIO (27 sticks).  Leta Speller, DAII demonstrated use of appliance.  Verbal and written postop instructions were given to the patient.   PLAN AND RECOMMENDATIONS: I discussed the  risks, benefits, and complications of various scenarios with the patient in relationship to their medical and dental conditions, which included systemic infection or other serious issues such as osteoradionecrosis that could potentially occur either before, during or after their anticipated radiation therapy if dental/oral concerns are not addressed.  I explained that if any  chronic or acute dental/oral infection(s) are addressed and subsequently not maintained following medical optimization and recovery, their risk of the previously mentioned complications are just as high and could potentially occur postoperatively.  I explained all significant findings of the dental consultation with the patient including heavy tartar or calculus build-up, several teeth with cavities and inflamed/irritated gums, and the recommended care including comprehensive dental care in order to optimize their oral health following radiation therapy.  The patient verbalized understanding of all findings, discussion, and recommendations. We then discussed various treatment options to include no treatment, multiple extractions with alveoloplasty, pre-prosthetic surgery as indicated, periodontal therapy, dental restorations, root canal therapy, crown and bridge therapy, implant therapy, and replacement of missing teeth as indicated.  The patient verbalized understanding of all options, and agrees to return to his primary dentist and work towards improving his overall oral health after radiation therapy.  I explained that we would see him once more after radiation and send a referral back to his dentist for him.  He verbalized understanding. Plan to discuss all findings and recommendations with medical team and coordinate future care as needed.   Follow-up after radiation therapy. The patient will need to return to their regular dentist for routine dental care including replacement of missing teeth as needed, cleanings and exams.  All  questions and concerns were invited and addressed.  The patient tolerated today's visit well and departed in stable condition.  I spent in excess of 120 minutes during the conduct of this consultation and >50% of this time involved direct face-to-face encounter for counseling and/or coordination of the patient's care.      Charlaine Dalton, D.M.D.

## 2021-10-26 NOTE — Progress Notes (Signed)
Dental Form with Estimates of Radiation Dose      Diagnosis:    ICD-10-CM   1. 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 Squamous cell carcinoma of left tonsil ([facility])  C09.9     3. 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 Scatter protection   Simulation: ASAP   Other Notes: pending ENT opinion re: surgery upfront but I did not recommend surgery. Simulation will be delayed a bit for this consultation.  Please contact Eppie Gibson, MD, with patient's disposition after evaluation and/or dental treatment.

## 2021-10-29 ENCOUNTER — Other Ambulatory Visit: Payer: Self-pay

## 2021-10-29 DIAGNOSIS — K036 Deposits [accretions] on teeth: Secondary | ICD-10-CM | POA: Insufficient documentation

## 2021-10-29 DIAGNOSIS — M270 Developmental disorders of jaws: Secondary | ICD-10-CM | POA: Insufficient documentation

## 2021-10-29 DIAGNOSIS — Z01818 Encounter for other preprocedural examination: Secondary | ICD-10-CM | POA: Insufficient documentation

## 2021-10-29 DIAGNOSIS — K06020 Generalized gingival recession, unspecified: Secondary | ICD-10-CM | POA: Insufficient documentation

## 2021-10-29 DIAGNOSIS — K0530 Chronic periodontitis, unspecified: Secondary | ICD-10-CM | POA: Insufficient documentation

## 2021-10-29 DIAGNOSIS — K029 Dental caries, unspecified: Secondary | ICD-10-CM | POA: Insufficient documentation

## 2021-10-29 DIAGNOSIS — C099 Malignant neoplasm of tonsil, unspecified: Secondary | ICD-10-CM

## 2021-10-29 DIAGNOSIS — K08109 Complete loss of teeth, unspecified cause, unspecified class: Secondary | ICD-10-CM | POA: Insufficient documentation

## 2021-10-29 DIAGNOSIS — R5381 Other malaise: Secondary | ICD-10-CM

## 2021-10-29 DIAGNOSIS — K030 Excessive attrition of teeth: Secondary | ICD-10-CM | POA: Insufficient documentation

## 2021-10-29 DIAGNOSIS — F40232 Fear of other medical care: Secondary | ICD-10-CM | POA: Insufficient documentation

## 2021-11-09 ENCOUNTER — Other Ambulatory Visit: Payer: Self-pay | Admitting: Hematology and Oncology

## 2021-11-09 DIAGNOSIS — C099 Malignant neoplasm of tonsil, unspecified: Secondary | ICD-10-CM

## 2021-11-09 MED ORDER — ONDANSETRON HCL 8 MG PO TABS: 8.0000 mg | ORAL_TABLET | Freq: Two times a day (BID) | ORAL | 1 refills | Status: DC | PRN

## 2021-11-09 MED ORDER — LIDOCAINE-PRILOCAINE 2.5-2.5 % EX CREA: TOPICAL_CREAM | CUTANEOUS | 3 refills | Status: DC

## 2021-11-09 MED ORDER — DEXAMETHASONE 4 MG PO TABS: 8.0000 mg | ORAL_TABLET | Freq: Every day | ORAL | 1 refills | Status: DC

## 2021-11-09 MED ORDER — PROCHLORPERAZINE MALEATE 10 MG PO TABS: 10.0000 mg | ORAL_TABLET | Freq: Four times a day (QID) | ORAL | 1 refills | Status: DC | PRN

## 2021-11-09 MED ORDER — TRAMADOL HCL 50 MG PO TABS: 50.0000 mg | ORAL_TABLET | Freq: Three times a day (TID) | ORAL | 0 refills | Status: DC | PRN

## 2021-11-09 MED ORDER — LORAZEPAM 0.5 MG PO TABS: 0.5000 mg | ORAL_TABLET | Freq: Four times a day (QID) | ORAL | 0 refills | Status: DC | PRN

## 2021-11-09 NOTE — Progress Notes (Signed)
START ON PATHWAY REGIMEN - Head and Neck     A cycle is every 7 days:     Cisplatin   **Always confirm dose/schedule in your pharmacy ordering system**  Patient Characteristics: Oropharynx, HPV Negative/Unknown, Preoperative or Nonsurgical Candidate (Clinical Staging), Stage IVA/B Disease Classification: Oropharynx HPV Status: Negative (-) Therapeutic Status: Preoperative or Nonsurgical Candidate (Clinical Staging) AJCC T Category: cT2 AJCC 8 Stage Grouping: IVA AJCC N Category: cN2c AJCC M Category: cM0 Intent of Therapy: Curative Intent, Discussed with Patient

## 2021-11-09 NOTE — Progress Notes (Unsigned)
Pain medication sent to pharmacy of choice Treatment plan placed.

## 2021-11-10 ENCOUNTER — Other Ambulatory Visit: Payer: Self-pay

## 2021-11-10 ENCOUNTER — Ambulatory Visit
Admission: RE | Admit: 2021-11-10 | Discharge: 2021-11-10 | Disposition: A | Discharge status: Home / Self Care | Payer: Self-pay | Source: Ambulatory Visit | Attending: Radiation Oncology | Admitting: Radiation Oncology

## 2021-11-10 DIAGNOSIS — C099 Malignant neoplasm of tonsil, unspecified: Secondary | ICD-10-CM

## 2021-11-11 ENCOUNTER — Telehealth: Payer: Self-pay

## 2021-11-11 NOTE — Telephone Encounter (Signed)
Spoke with staff member at [facility] and [facility] and confirmed that notice of potential device damage was received (in regards to patient's nerve stimulator that may be affected from his upcoming radiation treatment). Staff member stated she would pass along to Dr. Arnoldo Morale and his APP, and they would either, fax back the completed form indicating no other actions are warranted, or call our department back with recommendations prior to patient starting radiation. Confirmed she had my direct call back number.

## 2021-11-15 ENCOUNTER — Ambulatory Visit
Admission: RE | Admit: 2021-11-15 | Discharge: 2021-11-15 | Disposition: A | Discharge status: Home / Self Care | Payer: No Typology Code available for payment source | Source: Ambulatory Visit | Attending: Radiation Oncology | Admitting: Radiation Oncology

## 2021-11-15 ENCOUNTER — Other Ambulatory Visit: Payer: Self-pay

## 2021-11-15 ENCOUNTER — Other Ambulatory Visit: Payer: Self-pay | Admitting: Radiology

## 2021-11-15 VITALS — BP 120/79 | HR 82 | Temp 96.6°F | Resp 18 | Ht 68.0 in | Wt 164.2 lb

## 2021-11-15 DIAGNOSIS — Z510 Encounter for antineoplastic radiation therapy: Secondary | ICD-10-CM | POA: Insufficient documentation

## 2021-11-15 DIAGNOSIS — C103 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: Secondary | ICD-10-CM | POA: Diagnosis present

## 2021-11-15 DIAGNOSIS — N182 Chronic kidney disease, stage 2 (mild): Secondary | ICD-10-CM | POA: Diagnosis not present

## 2021-11-15 DIAGNOSIS — C099 Malignant neoplasm of tonsil, unspecified: Secondary | ICD-10-CM | POA: Diagnosis present

## 2021-11-15 DIAGNOSIS — I129 Hypertensive chronic kidney disease with stage 1 through stage 4 chronic kidney disease, or unspecified chronic kidney disease: Secondary | ICD-10-CM | POA: Diagnosis not present

## 2021-11-15 DIAGNOSIS — Z79899 Other long term (current) drug therapy: Secondary | ICD-10-CM | POA: Diagnosis not present

## 2021-11-15 DIAGNOSIS — F17210 Nicotine dependence, cigarettes, uncomplicated: Secondary | ICD-10-CM | POA: Diagnosis not present

## 2021-11-15 DIAGNOSIS — Z808 Family history of malignant neoplasm of other organs or systems: Secondary | ICD-10-CM | POA: Diagnosis not present

## 2021-11-15 DIAGNOSIS — G893 Neoplasm related pain (acute) (chronic): Secondary | ICD-10-CM | POA: Diagnosis not present

## 2021-11-15 MED ORDER — SODIUM CHLORIDE 0.9% FLUSH
10.0000 mL | Freq: Once | INTRAVENOUS | Status: AC
  Administered 2021-11-15: 10 mL via INTRAVENOUS

## 2021-11-15 NOTE — Progress Notes (Signed)
Oncology Nurse Navigator Documentation  ° °To provide support, encouragement and care continuity, met with [name] [name] during his CT SIM.  °He tolerated procedure without difficulty, denied questions/concerns.   ° °I also met with [name] [name] to provide PEG/port education prior to 11/17/21 placement. °Provided port educational handout, showed example, provided guidance for post-surgical dsg removal, site care. ° °Using  PEG teaching device   and Teach Back, provided education for PEG use and care, including: hand hygiene, gravity bolus administration of daily water flushes and nutritional supplement, fluids and medications; care of tube insertion site including daily dressing change and cleaning; S&S of infection.   °[name] [name] correctly verbalized procedures for and provided correct return demonstration of gravity administration of water, dressing change and site care.  °I provided written instructions for PEG flushing/dressing change in support of verbal instruction.   °I provided/described contents of Start of Care Bolus Feeding Kit (3 60 cc syringes, 2 boxes 4x4 drainage sponges, 1 package mesh briefs, 1 roll paper tape, 1 case Osmolite 1.5).  He voiced understanding he is to start using Osmolite per guidance of Nutrition. °He understands I will be available for ongoing PEG support. °Provided barium sulfate prep which I obtained from [facility] and reviewed instructions. ° °  RN, BSN, OCN °Head & Neck Oncology Nurse Navigator °[facility] °Phone # [phone]  °Fax # [phone]   °

## 2021-11-15 NOTE — Progress Notes (Signed)
Has armband been applied?  Yes.    Does patient have an allergy to IV contrast dye?: No.   Has patient ever received premedication for IV contrast dye?: No.   Does patient take metformin?: No.  Date of lab work: October 25, 2021 BUN: 11 CR: 0.61 eGFR: >60  IV site: antecubital left, condition patent and no redness  Has IV site been added to flowsheet?  Yes.    BP 120/79 (BP Location: Left Arm, Patient Position: Sitting)    Pulse 82    Temp (!) 96.6 F (35.9 C) (Temporal)    Resp 18    Ht _0  (1.727 m)    Wt 164 lb 4 oz (74.5 kg)    SpO2 97%    BMI 24.97 kg/m

## 2021-11-16 ENCOUNTER — Inpatient Hospital Stay: Payer: No Typology Code available for payment source

## 2021-11-16 ENCOUNTER — Encounter: Payer: Self-pay | Admitting: Hematology and Oncology

## 2021-11-16 ENCOUNTER — Other Ambulatory Visit: Payer: Self-pay

## 2021-11-16 ENCOUNTER — Inpatient Hospital Stay
Payer: No Typology Code available for payment source | Attending: Hematology and Oncology | Admitting: Hematology and Oncology

## 2021-11-16 DIAGNOSIS — N182 Chronic kidney disease, stage 2 (mild): Secondary | ICD-10-CM | POA: Insufficient documentation

## 2021-11-16 DIAGNOSIS — Z510 Encounter for antineoplastic radiation therapy: Secondary | ICD-10-CM | POA: Insufficient documentation

## 2021-11-16 DIAGNOSIS — G893 Neoplasm related pain (acute) (chronic): Secondary | ICD-10-CM | POA: Insufficient documentation

## 2021-11-16 DIAGNOSIS — F17210 Nicotine dependence, cigarettes, uncomplicated: Secondary | ICD-10-CM | POA: Insufficient documentation

## 2021-11-16 DIAGNOSIS — Z79899 Other long term (current) drug therapy: Secondary | ICD-10-CM | POA: Insufficient documentation

## 2021-11-16 DIAGNOSIS — C103 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: Secondary | ICD-10-CM | POA: Insufficient documentation

## 2021-11-16 DIAGNOSIS — I129 Hypertensive chronic kidney disease with stage 1 through stage 4 chronic kidney disease, or unspecified chronic kidney disease: Secondary | ICD-10-CM | POA: Insufficient documentation

## 2021-11-16 DIAGNOSIS — C099 Malignant neoplasm of tonsil, unspecified: Secondary | ICD-10-CM | POA: Diagnosis not present

## 2021-11-16 DIAGNOSIS — Z808 Family history of malignant neoplasm of other organs or systems: Secondary | ICD-10-CM | POA: Insufficient documentation

## 2021-11-16 MED ORDER — OXYCODONE HCL 5 MG PO TABS: 5.0000 mg | ORAL_TABLET | ORAL | 0 refills | Status: DC | PRN

## 2021-11-16 NOTE — Progress Notes (Signed)
[place] CONSULT NOTE  Patient Care Team: [name], MD as PCP - General (Family Medicine)  CHIEF COMPLAINTS/PURPOSE OF CONSULTATION:  SCC oropharynx  ASSESSMENT & PLAN:  Squamous cell carcinoma of left tonsil [facility]) This is a very pleasant 66 year old male patient, current everyday smoker with newly diagnosed squamous cell carcinoma of the left tonsil, p16 negative, staging T1 versus T2, borderline size, and N2 with bilateral cervical lymphadenopathy which are hypermetabolic on PET imaging and M0 with no evidence of distant metastasis referred to medical oncology for consideration of concurrent chemotherapy and radiation.  Given bilateral cervical lymphadenopathy, we have discussed about considering concurrent chemotherapy and radiation as frontline treatment.  He went to [facility] for a second opinion about role of surgery. He decided to proceed with CRT locally after talking to surgery team. He is scheduled to start chemotherapy next week We have once again discussed about adverse effects of cisplatin including fatigue, nausea, vomiting, hearing loss, nephrotoxicity, peripheral neuropathy, increased risk of infections and cytopenias. His serum creatinine at baseline has been reviewed, okay to proceed with cisplatin as planned. Patient return to clinic before cycle 1 day 1 and weekly while on treatment.  He is scheduled for port and PEG tube placement tomorrow.  Cancer related pain After his last message, we have dispensed a prescription for tramadol which has not been helping him.  Last night he woke up several times because of pain in his throat and he is not able to sleep well.  He is hopeful to try different pain medication although he is reluctant. Prescription for oxycodone to use as needed for pain has been dispensed to the pharmacy of his choice.   No orders of the defined types were placed in this encounter.    HISTORY OF PRESENTING ILLNESS:  [name] [name] 66  y.o. male is here because of SCC oropharynx.  This is a very pleasant 66 year old male patient with past medical history significant for hypertension, dyslipidemia referred to medical oncology with new diagnosis of squamous cell carcinoma of the tonsil.  He was seen by Dr. Benjamine Mola  for chief complaint of sore throat.  Back in July when he had a CT neck, there was no identifiable neck mass or enlarged nodes. He continued to have sore throat and  had a left tonsil biopsy which showed invasive squamous cell carcinoma with ulceration and necrosis, p16 immunostain is negative. PET/CT on 10/18/2018 which showed a hypermetabolic mass centered along the wall of the left posterior lateral oropharynx consistent with known tonsillar neoplasm, hypermetabolic bilateral cervical lymph nodes suspicious for disease involvement.  No evidence of hypermetabolic disease in the chest abdomen or pelvis. He was seen at [facility] for second opinion, recommendation was to proceed with CRT  He is here to review recommendations and to get started with treatment. He complains of severe pain in his throat which has not been allowing him to sleep.  We have tried tramadol which did not work for him.  He is hoping to try different medication.  He again is very reluctant about taking narcotics since he is worried about dependence and addiction.  Besides the throat pain and pain, he denies any new complaints.  Rest of the pertinent 10 point ROS reviewed and negative.  MEDICAL HISTORY:  Past Medical History:  Diagnosis Date   Acute kidney failure ([place]) 04/2018   CKD (chronic kidney disease), stage II    Complication of anesthesia    DDD (degenerative disc disease), lumbar  History of pancreatitis    20-30 years ago   History of venomous snake bite    rt index finger-   Hypercholesterolemia    Hypertension    Nerve pain    Right leg, resolved with stimulator   PONV (postoperative nausea and vomiting)    S/P insertion of spinal cord  stimulator 08/2017   Spinal stenosis    Tobacco abuse     SURGICAL HISTORY: Past Surgical History:  Procedure Laterality Date   COLONOSCOPY     FINGER EXPLORATION  05/2011   rt index finger snakebitex5   HERNIA REPAIR     as a child   I & D EXTREMITY  11/24/2011   Procedure: IRRIGATION AND DEBRIDEMENT EXTREMITY;  Surgeon: Tennis Must, MD;  Location: [place];  Service: Orthopedics;  Laterality: Right;  right index   MASS EXCISION  01/23/2012   Procedure: EXCISION MASS;  Surgeon: Tennis Must, MD;  Location: [place];  Service: Orthopedics;  Laterality: Right;  Right index a-cell graft   TOTAL HIP ARTHROPLASTY Right 05/03/2018   Procedure: RIGHT TOTAL HIP ARTHROPLASTY ANTERIOR APPROACH;  Surgeon: Rod Can, MD;  Location: [facility];  Service: Orthopedics;  Laterality: Right;  Needs RNFA    SOCIAL HISTORY: Social History   Socioeconomic History   Marital status: Married    Spouse name: Not on file   Number of children: Not on file   Years of education: Not on file   Highest education level: Not on file  Occupational History   Not on file  Tobacco Use   Smoking status: Every Day    Packs/day: 0.50    Years: 20.00    Pack years: 10.00    Types: Cigarettes   Smokeless tobacco: Never  Vaping Use   Vaping Use: Never used  Substance and Sexual Activity   Alcohol use: Yes    Comment: occasional   Drug use: No   Sexual activity: Not Currently  Other Topics Concern   Not on file  Social History Narrative   Not on file   Social Determinants of Health   Financial Resource Strain: Not on file  Food Insecurity: Not on file  Transportation Needs: Not on file  Physical Activity: Not on file  Stress: Not on file  Social Connections: Not on file  Intimate Partner Violence: Not on file    FAMILY HISTORY: Family History  Problem Relation Age of Onset   Diabetes Mother    Throat cancer Brother     ALLERGIES:  is allergic to  codeine.  MEDICATIONS:  Current Outpatient Medications  Medication Sig Dispense Refill   oxyCODONE (OXY IR/ROXICODONE) 5 MG immediate release tablet Take 1 tablet (5 mg total) by mouth every 4 (four) hours as needed for severe pain. 30 tablet 0   dexamethasone (DECADRON) 4 MG tablet Take 2 tablets (8 mg total) by mouth daily. Take daily x 3 days starting the day after cisplatin chemotherapy. Take with food. 30 tablet 1   lidocaine-prilocaine (EMLA) cream Apply to affected area once 30 g 3   LORazepam (ATIVAN) 0.5 MG tablet Take 1 tablet (0.5 mg total) by mouth every 6 (six) hours as needed (Nausea or vomiting). 30 tablet 0   Multiple Vitamins-Minerals (CENTRUM ULTRA MENS) TABS Take 1 tablet by mouth daily.       olmesartan-hydrochlorothiazide (BENICAR HCT) 40-25 MG per tablet Take 1 tablet by mouth daily.       ondansetron (ZOFRAN) 8 MG tablet  Take 1 tablet (8 mg total) by mouth 2 (two) times daily as needed. Start on the third day after cisplatin chemotherapy. 30 tablet 1   prochlorperazine (COMPAZINE) 10 MG tablet Take 1 tablet (10 mg total) by mouth every 6 (six) hours as needed (Nausea or vomiting). 30 tablet 1   rosuvastatin (CRESTOR) 10 MG tablet Take 10 mg by mouth daily.      No current facility-administered medications for this visit.     PHYSICAL EXAMINATION: ECOG PERFORMANCE STATUS: 0 - Asymptomatic  Vitals:   11/16/21 1353  BP: 92/68  Pulse: 89  Resp: 16  Temp: 97.9 F (36.6 C)  SpO2: 96%    Filed Weights   11/16/21 1353  Weight: 160 lb 8 oz (72.8 kg)    GENERAL:alert, no distress and comfortable SKIN: skin color, texture, turgor are normal, no rashes or significant lesions EYES: normal, conjunctiva are pink and non-injected, sclera clear OROPHARYNX:no exudate, no erythema and lips, buccal mucosa, and tongue normal  NECK: supple, thyroid normal size, non-tender, without nodularity LYMPH:  no palpable lymphadenopathy in the cervical, axillary  LUNGS: clear to  auscultation and percussion with normal breathing effort HEART: regular rate & rhythm and no murmurs and no lower extremity edema ABDOMEN:abdomen soft, non-tender and normal bowel sounds Musculoskeletal:no cyanosis of digits and no clubbing  PSYCH: alert & oriented x 3 with fluent speech NEURO: no focal motor/sensory deficits  LABORATORY DATA:  I have reviewed the data as listed Lab Results  Component Value Date   WBC 7.0 10/25/2021   HGB 15.1 10/25/2021   HCT 43.3 10/25/2021   MCV 97.7 10/25/2021   PLT 197 10/25/2021     Chemistry      Component Value Date/Time   NA 134 (L) 10/25/2021 1053   K 4.3 10/25/2021 1053   CL 105 10/25/2021 1053   CO2 21 (L) 10/25/2021 1053   BUN 11 10/25/2021 1053   CREATININE 0.61 10/25/2021 1053      Component Value Date/Time   CALCIUM 9.1 10/25/2021 1053   ALKPHOS 91 10/25/2021 1053   AST 72 (H) 10/25/2021 1053   ALT 48 (H) 10/25/2021 1053   BILITOT 1.0 10/25/2021 1053       RADIOGRAPHIC STUDIES: I have personally reviewed the radiological images as listed and agreed with the findings in the report. NM PET Image Initial (PI) Skull Base To Thigh (F-18 FDG)  Result Date: 10/18/2021 CLINICAL DATA:  Initial treatment strategy for tonsillar cancer. EXAM: NUCLEAR MEDICINE PET SKULL BASE TO THIGH TECHNIQUE: 7.6 mCi F-18 FDG was injected intravenously. Full-ring PET imaging was performed from the skull base to thigh after the radiotracer. CT data was obtained and used for attenuation correction and anatomic localization. Fasting blood glucose: 135 mg/dl COMPARISON:  CT April 07, 2021. FINDINGS: Mediastinal blood pool activity: SUV max 2.01 Liver activity: SUV max NA NECK: Hypermetabolic mass centered along the wall of the left posterolateral oropharynx measuring approximally 20 x 15 mm on image 27/4 with a max SUV of 12.76. Hypermetabolic prominent bilateral cervical lymph nodes. For reference: -hypermetabolic left level 2a lymph node measures 8 mm on  image 35/4 with a max SUV of 6.3. -hypermetabolic right level 2a lymph node measures 6 mm on image 32/4 with a max SUV of 4.4. Incidental CT findings: Streak artifact from dental hardware. Mucosal thickening of the right maxillary sinus. CHEST: No hypermetabolic mediastinal or hilar nodes. No suspicious pulmonary nodules on the CT scan. Incidental CT findings: No suspicious pulmonary nodules. No  pleural effusion. No pneumothorax. Aortic and branch vessel atherosclerosis without thoracic aortic aneurysm. ABDOMEN/PELVIS: No abnormal hypermetabolic activity within the liver, pancreas, adrenal glands, or spleen. No hypermetabolic lymph nodes in the abdomen or pelvis. Incidental CT findings: Diffuse hepatic steatosis with focal fatty sparing along the gallbladder fossa. Gallbladder is unremarkable. No biliary ductal dilation. Atrophic pancreas with numerous calcifications throughout the gland, consistent with sequela of chronic pancreatitis. No hydronephrosis. No evidence of bowel obstruction or acute bowel inflammation. Aortic and branch vessel atherosclerosis without abdominal aortic aneurysm. Left posterior paraspinal stimulator generator with lead tip in the spinal canal. SKELETON: No focal hypermetabolic activity to suggest skeletal metastasis. Incidental CT findings: Multilevel degenerative changes spine. Degenerative change of the SI joints with partial bony ankylosis. Right total hip arthroplasty. No aggressive lytic or blastic lesion of bone. IMPRESSION: 1. Hypermetabolic mass centered along the wall of the left posterolateral oropharynx, consistent with known tonsillar neoplasm. 2. Hypermetabolic bilateral cervical lymph nodes, suspicious for disease involvement. 3. No evidence of hypermetabolic metastatic disease in the chest, abdomen, or pelvis. 4.   Additional ancillary chronic findings described above. Electronically Signed   By: Dahlia Bailiff M.D.   On: 10/18/2021 10:24    All questions were answered.  The patient knows to call the clinic with any problems, questions or concerns. I spent  minutes in the care of this patient including H and P, review of records, counseling and coordination of care.     Benay Pike, MD 11/16/2021 3:25 PM

## 2021-11-16 NOTE — Assessment & Plan Note (Signed)
This is a very pleasant 66 year old male patient, current everyday smoker with newly diagnosed squamous cell carcinoma of the left tonsil, p16 negative, staging T1 versus T2, borderline size, and N2 with bilateral cervical lymphadenopathy which are hypermetabolic on PET imaging and M0 with no evidence of distant metastasis referred to medical oncology for consideration of concurrent chemotherapy and radiation.  Given bilateral cervical lymphadenopathy, we have discussed about considering concurrent chemotherapy and radiation as frontline treatment.  He went to [facility] for a second opinion about role of surgery. He decided to proceed with CRT locally after talking to surgery team. He is scheduled to start chemotherapy next week We have once again discussed about adverse effects of cisplatin including fatigue, nausea, vomiting, hearing loss, nephrotoxicity, peripheral neuropathy, increased risk of infections and cytopenias. His serum creatinine at baseline has been reviewed, okay to proceed with cisplatin as planned. Patient return to clinic before cycle 1 day 1 and weekly while on treatment.  He is scheduled for port and PEG tube placement tomorrow.

## 2021-11-16 NOTE — Assessment & Plan Note (Signed)
After his last message, we have dispensed a prescription for tramadol which has not been helping him.  Last night he woke up several times because of pain in his throat and he is not able to sleep well.  He is hopeful to try different pain medication although he is reluctant. Prescription for oxycodone to use as needed for pain has been dispensed to the pharmacy of his choice.

## 2021-11-17 ENCOUNTER — Other Ambulatory Visit: Payer: Self-pay

## 2021-11-17 ENCOUNTER — Ambulatory Visit (HOSPITAL_COMMUNITY)
Admission: RE | Admit: 2021-11-17 | Discharge: 2021-11-17 | Disposition: A | Discharge status: Home / Self Care | Payer: No Typology Code available for payment source | Source: Ambulatory Visit | Attending: Hematology and Oncology | Admitting: Hematology and Oncology

## 2021-11-17 ENCOUNTER — Other Ambulatory Visit: Payer: Self-pay | Admitting: Hematology and Oncology

## 2021-11-17 ENCOUNTER — Encounter (HOSPITAL_COMMUNITY): Payer: Self-pay

## 2021-11-17 ENCOUNTER — Inpatient Hospital Stay (HOSPITAL_COMMUNITY): Admission: RE | Admit: 2021-11-17 | Payer: No Typology Code available for payment source | Source: Ambulatory Visit

## 2021-11-17 DIAGNOSIS — F17210 Nicotine dependence, cigarettes, uncomplicated: Secondary | ICD-10-CM | POA: Diagnosis not present

## 2021-11-17 DIAGNOSIS — C099 Malignant neoplasm of tonsil, unspecified: Secondary | ICD-10-CM | POA: Diagnosis not present

## 2021-11-17 DIAGNOSIS — M5136 Other intervertebral disc degeneration, lumbar region: Secondary | ICD-10-CM | POA: Insufficient documentation

## 2021-11-17 DIAGNOSIS — I129 Hypertensive chronic kidney disease with stage 1 through stage 4 chronic kidney disease, or unspecified chronic kidney disease: Secondary | ICD-10-CM | POA: Diagnosis not present

## 2021-11-17 DIAGNOSIS — N182 Chronic kidney disease, stage 2 (mild): Secondary | ICD-10-CM | POA: Diagnosis not present

## 2021-11-17 HISTORY — PX: IR IMAGING GUIDED PORT INSERTION: IMG5740

## 2021-11-17 HISTORY — PX: IR US GUIDE VASC ACCESS RIGHT: IMG2390

## 2021-11-17 LAB — CBC WITH DIFFERENTIAL/PLATELET
Abs Immature Granulocytes: 0.02 10*3/uL (ref 0.00–0.07)
Basophils Absolute: 0 10*3/uL (ref 0.0–0.1)
Basophils Relative: 0 %
Eosinophils Absolute: 0.5 10*3/uL (ref 0.0–0.5)
Eosinophils Relative: 7 %
HCT: 41.5 % (ref 39.0–52.0)
Hemoglobin: 14.8 g/dL (ref 13.0–17.0)
Immature Granulocytes: 0 %
Lymphocytes Relative: 25 %
Lymphs Abs: 1.7 10*3/uL (ref 0.7–4.0)
MCH: 34.7 pg — ABNORMAL HIGH (ref 26.0–34.0)
MCHC: 35.7 g/dL (ref 30.0–36.0)
MCV: 97.4 fL (ref 80.0–100.0)
Monocytes Absolute: 0.7 10*3/uL (ref 0.1–1.0)
Monocytes Relative: 10 %
Neutro Abs: 3.9 10*3/uL (ref 1.7–7.7)
Neutrophils Relative %: 58 %
Platelets: 192 10*3/uL (ref 150–400)
RBC: 4.26 MIL/uL (ref 4.22–5.81)
RDW: 12.2 % (ref 11.5–15.5)
WBC: 6.9 10*3/uL (ref 4.0–10.5)
nRBC: 0 % (ref 0.0–0.2)

## 2021-11-17 LAB — BASIC METABOLIC PANEL
Anion gap: 12 (ref 5–15)
BUN: 28 mg/dL — ABNORMAL HIGH (ref 8–23)
CO2: 19 mmol/L — ABNORMAL LOW (ref 22–32)
Calcium: 8.1 mg/dL — ABNORMAL LOW (ref 8.9–10.3)
Chloride: 100 mmol/L (ref 98–111)
Creatinine, Ser: 0.81 mg/dL (ref 0.61–1.24)
GFR, Estimated: >60 mL/min (ref >60)
Glucose, Bld: 97 mg/dL (ref 70–99)
Potassium: 3.7 mmol/L (ref 3.5–5.1)
Sodium: 131 mmol/L — ABNORMAL LOW (ref 135–145)

## 2021-11-17 LAB — PROTIME-INR
INR: 1.1 (ref 0.8–1.2)
Prothrombin Time: 13.7 seconds (ref 11.4–15.2)

## 2021-11-17 MED ORDER — LIDOCAINE-EPINEPHRINE (PF) 1 %-1:200000 IJ SOLN
INTRAMUSCULAR | Status: AC | PRN
  Administered 2021-11-17: 20 mL

## 2021-11-17 MED ORDER — MIDAZOLAM HCL 2 MG/2ML IJ SOLN
INTRAMUSCULAR | Status: AC
  Filled 2021-11-17: qty 2

## 2021-11-17 MED ORDER — HEPARIN SOD (PORK) LOCK FLUSH 100 UNIT/ML IV SOLN
INTRAVENOUS | Status: AC | PRN
  Administered 2021-11-17: 500 [IU] via INTRAVENOUS

## 2021-11-17 MED ORDER — FENTANYL CITRATE (PF) 100 MCG/2ML IJ SOLN
INTRAMUSCULAR | Status: AC
  Filled 2021-11-17: qty 2

## 2021-11-17 MED ORDER — HEPARIN SOD (PORK) LOCK FLUSH 100 UNIT/ML IV SOLN
INTRAVENOUS | Status: AC
  Filled 2021-11-17: qty 5

## 2021-11-17 MED ORDER — CEFAZOLIN SODIUM-DEXTROSE 2-4 GM/100ML-% IV SOLN: 2.0000 g | Freq: Once | INTRAVENOUS | Status: DC

## 2021-11-17 MED ORDER — MIDAZOLAM HCL 2 MG/2ML IJ SOLN
INTRAMUSCULAR | Status: AC | PRN
  Administered 2021-11-17: 1 mg via INTRAVENOUS

## 2021-11-17 MED ORDER — SODIUM CHLORIDE 0.9 % IV SOLN: INTRAVENOUS | Status: DC

## 2021-11-17 MED ORDER — SODIUM CHLORIDE 0.9 % IV SOLN
INTRAVENOUS | Status: AC | PRN
  Administered 2021-11-17 (×2): 500 mL via INTRAVENOUS

## 2021-11-17 MED ORDER — LIDOCAINE-EPINEPHRINE 1 %-1:100000 IJ SOLN
INTRAMUSCULAR | Status: AC
  Filled 2021-11-17: qty 1

## 2021-11-17 NOTE — H&P (Addendum)
Chief Complaint: Patient was seen in consultation today for Port-A-Cath placement  at the request of [place]  Referring Physician(s): Iruku,Praveena  Supervising Physician: Michaelle Birks  Patient Status: [facility] - Out-pt  History of Present Illness: [name] [name] is a 66 y.o. male with PMH of CKD stage II, DDD, HTN, PONV, spinal stenosis and tobacco abuse.  Patient has recent diagnosis of squamous cell carcinoma of the tonsil.  Patient was referred by Dr. Benay Pike for Port-A-Cath placement to begin chemotherapy. Pt was also scheduled for g-tube placement today, but has decided to postpone procedure and have only port placed.   Past Medical History:  Diagnosis Date   Acute kidney failure ([place]) 04/2018   CKD (chronic kidney disease), stage II    Complication of anesthesia    DDD (degenerative disc disease), lumbar    History of pancreatitis    20-30 years ago   History of venomous snake bite    rt index finger-   Hypercholesterolemia    Hypertension    Nerve pain    Right leg, resolved with stimulator   PONV (postoperative nausea and vomiting)    S/P insertion of spinal cord stimulator 08/2017   Spinal stenosis    Tobacco abuse     Past Surgical History:  Procedure Laterality Date   COLONOSCOPY     FINGER EXPLORATION  05/2011   rt index finger snakebitex5   HERNIA REPAIR     as a child   I & D EXTREMITY  11/24/2011   Procedure: IRRIGATION AND DEBRIDEMENT EXTREMITY;  Surgeon: Tennis Must, MD;  Location: [place];  Service: Orthopedics;  Laterality: Right;  right index   MASS EXCISION  01/23/2012   Procedure: EXCISION MASS;  Surgeon: Tennis Must, MD;  Location: [place];  Service: Orthopedics;  Laterality: Right;  Right index a-cell graft   TOTAL HIP ARTHROPLASTY Right 05/03/2018   Procedure: RIGHT TOTAL HIP ARTHROPLASTY ANTERIOR APPROACH;  Surgeon: Rod Can, MD;  Location: [facility];  Service: Orthopedics;  Laterality:  Right;  Needs RNFA    Allergies: Codeine  Medications: Prior to Admission medications   Medication Sig Start Date End Date Taking? Authorizing Provider  dexamethasone (DECADRON) 4 MG tablet Take 2 tablets (8 mg total) by mouth daily. Take daily x 3 days starting the day after cisplatin chemotherapy. Take with food. 11/09/21   Benay Pike, MD  lidocaine-prilocaine (EMLA) cream Apply to affected area once 11/09/21   Benay Pike, MD  LORazepam (ATIVAN) 0.5 MG tablet Take 1 tablet (0.5 mg total) by mouth every 6 (six) hours as needed (Nausea or vomiting). 11/09/21   Benay Pike, MD  Multiple Vitamins-Minerals (CENTRUM ULTRA MENS) TABS Take 1 tablet by mouth daily.      [provider]  olmesartan-hydrochlorothiazide (BENICAR HCT) 40-25 MG per tablet Take 1 tablet by mouth daily.      [provider]  ondansetron (ZOFRAN) 8 MG tablet Take 1 tablet (8 mg total) by mouth 2 (two) times daily as needed. Start on the third day after cisplatin chemotherapy. 11/09/21   Benay Pike, MD  oxyCODONE (OXY IR/ROXICODONE) 5 MG immediate release tablet Take 1 tablet (5 mg total) by mouth every 4 (four) hours as needed for severe pain. 11/16/21 12/16/21  Benay Pike, MD  prochlorperazine (COMPAZINE) 10 MG tablet Take 1 tablet (10 mg total) by mouth every 6 (six) hours as needed (Nausea or vomiting). 11/09/21   Benay Pike, MD  rosuvastatin ([place]) 10  MG tablet Take 10 mg by mouth daily.     [provider]     Family History  Problem Relation Age of Onset   Diabetes Mother    Throat cancer Brother     Social History   Socioeconomic History   Marital status: Married    Spouse name: Not on file   Number of children: Not on file   Years of education: Not on file   Highest education level: Not on file  Occupational History   Not on file  Tobacco Use   Smoking status: Every Day    Packs/day: 0.50    Years: 20.00    Pack years: 10.00    Types: Cigarettes    Smokeless tobacco: Never  Vaping Use   Vaping Use: Never used  Substance and Sexual Activity   Alcohol use: Yes    Comment: occasional   Drug use: No   Sexual activity: Not Currently  Other Topics Concern   Not on file  Social History Narrative   Not on file   Social Determinants of Health   Financial Resource Strain: Not on file  Food Insecurity: Not on file  Transportation Needs: Not on file  Physical Activity: Not on file  Stress: Not on file  Social Connections: Not on file    Review of Systems: A 12 point ROS discussed and pertinent positives are indicated in the HPI above.  All other systems are negative.  Review of Systems  Constitutional:  Negative for chills, fatigue and fever.  HENT:  Negative for nosebleeds.   Eyes:  Negative for visual disturbance.  Respiratory:  Negative for cough and shortness of breath.   Cardiovascular:  Negative for chest pain and leg swelling.  Gastrointestinal:  Negative for abdominal pain, blood in stool, nausea and vomiting.  Genitourinary:  Negative for hematuria.  Neurological:  Negative for dizziness, light-headedness and headaches.   Vital Signs: BP (!) 83/57    Pulse 73    Temp 97.6 F (36.4 C) (Oral)    Resp 16    Ht 5\' 8"  (1.727 m)    Wt 160 lb (72.6 kg)    SpO2 96%    BMI 24.33 kg/m   Physical Exam Constitutional:      Appearance: Normal appearance. He is not ill-appearing.  HENT:     Head: Normocephalic and atraumatic.     Mouth/Throat:     Mouth: Mucous membranes are moist.     Pharynx: Oropharynx is clear.  Eyes:     Extraocular Movements: Extraocular movements intact.     Pupils: Pupils are equal, round, and reactive to light.  Cardiovascular:     Rate and Rhythm: Normal rate and regular rhythm.     Pulses: Normal pulses.     Heart sounds: No murmur heard.   No friction rub. No gallop.  Pulmonary:     Effort: Pulmonary effort is normal. No respiratory distress.     Breath sounds: Normal breath sounds. No  stridor. No wheezing, rhonchi or rales.  Abdominal:     General: Bowel sounds are normal. There is no distension.     Palpations: Abdomen is soft.     Tenderness: There is no abdominal tenderness. There is no guarding.  Musculoskeletal:     Right lower leg: No edema.     Left lower leg: No edema.  Skin:    General: Skin is dry.  Neurological:     Mental Status: He is alert and oriented to  person, place, and time.  Psychiatric:        Mood and Affect: Mood normal.        Behavior: Behavior normal.        Thought Content: Thought content normal.        Judgment: Judgment normal.    Imaging: No results found.  Labs:  CBC: Recent Labs    10/25/21 1053 11/17/21 0912  WBC 7.0 6.9  HGB 15.1 14.8  HCT 43.3 41.5  PLT 197 192    COAGS: Recent Labs    11/17/21 0912  INR 1.1    BMP: Recent Labs    04/07/21 0904 10/25/21 1053 11/17/21 0932  NA  --  134* 131*  K  --  4.3 3.7  CL  --  105 100  CO2  --  21* 19*  GLUCOSE  --  106* 97  BUN  --  11 28*  CALCIUM  --  9.1 8.1*  CREATININE 1.20 0.61 0.81  GFRNONAA  --  >60 >60    LIVER FUNCTION TESTS: Recent Labs    10/25/21 1053  BILITOT 1.0  AST 72*  ALT 48*  ALKPHOS 91  PROT 7.3  ALBUMIN 3.9    TUMOR MARKERS: No results for input(s): AFPTM, CEA, CA199, CHROMGRNA in the last 8760 hours.  Assessment and Plan: History of CKD stage II, DDD, HTN, PONV, spinal stenosis and tobacco abuse.  Patient has recent diagnosis of squamous cell carcinoma of the tonsil.  Patient was referred by Dr. Benay Pike for Port-A-Cath placement to begin chemotherapy. Pt was also scheduled for g-tube placement today, but has decided to postpone procedure and have only port placed.   Pt resting on stretcher. He is A&O, calm and pleasant.  He is in no distress.  No labs needed today.  Pt states he is NPO per order.  Pt denies the use of blood thinning medications.   Risks and benefits of image guided port-a-catheter placement  was discussed with the patient including, but not limited to bleeding, infection, pneumothorax, or fibrin sheath development and need for additional procedures.  All of the patient's questions were answered, patient is agreeable to proceed. Consent signed and in chart.   Thank you for this interesting consult.  I greatly enjoyed meeting [name] [name] and look forward to participating in their care.  A copy of this report was sent to the requesting provider on this date.  Electronically Signed: Tyson Alias, NP 11/17/2021, 10:29 AM   I spent a total of 20 minutes in face to face in clinical consultation, greater than 50% of which was counseling/coordinating care for Port-A-Cath placement.

## 2021-11-17 NOTE — Progress Notes (Signed)
Oncology Nurse Navigator Documentation   Mr. Douse called me this morning before his scheduled PAC/PEG insertion in IR today. He told me that he has thought extensively about the PEG tube and has decided against getting it today but is still agreeable to get the [facility]. I explained that the PEG was recommended by Dr. Isidore Moos and Dr. Chryl Heck for expected side effects related to his treatment of chemotherapy and radiation for his head and neck cancer. I also explained that if he makes the decision to get it during treatment there may be a delay in scheduling due to availability in Interventional Radiology. He verbalized understanding and continued to decline to get the PEG today as scheduled. I have notified IR, Dr. Isidore Moos, and Dr. Chryl Heck of the above conversation.   Harlow Asa RN, BSN, OCN Head & Neck Oncology Nurse [place] [facility] Phone # [phone]  Fax # [phone]

## 2021-11-17 NOTE — Procedures (Signed)
Vascular and Interventional Radiology Procedure Note  Patient: [name] [name] DOB: 12-22-1955 Medical Record Number: [id number] Note Date/Time: 11/17/21 11:27 AM   Performing Physician: Michaelle Birks, MD Assistant(s): None  Diagnosis: Head and Neck cancer  Procedure: PORT PLACEMENT  Anesthesia: Conscious Sedation Complications: None Estimated Blood Loss: Minimal  Findings:  Successful right-sided port placement, with the tip of the catheter in the proximal right atrium.  Plan: Catheter ready for use.  See detailed procedure note with images in PACS. The patient tolerated the procedure well without incident or complication and was returned to Short Stay in stable condition.    Michaelle Birks, MD Vascular [facility] Interventional Radiology [facility]   Pager. [place]

## 2021-11-17 NOTE — Discharge Instructions (Signed)

## 2021-11-18 ENCOUNTER — Other Ambulatory Visit: Payer: Self-pay

## 2021-11-18 DIAGNOSIS — C099 Malignant neoplasm of tonsil, unspecified: Secondary | ICD-10-CM

## 2021-11-18 NOTE — Progress Notes (Signed)
Pharmacist Chemotherapy Monitoring - Initial Assessment    Anticipated start date: 11/25/21   The following has been reviewed per standard work regarding the patient's treatment regimen: The patient's diagnosis, treatment plan and drug doses, and organ/hematologic function Lab orders and baseline tests specific to treatment regimen  The treatment plan start date, drug sequencing, and pre-medications Prior authorization status  Patient's documented medication list, including drug-drug interaction screen and prescriptions for anti-emetics and supportive care specific to the treatment regimen The drug concentrations, fluid compatibility, administration routes, and timing of the medications to be used The patient's access for treatment and lifetime cumulative dose history, if applicable  The patient's medication allergies and previous infusion related reactions, if applicable   Changes made to treatment plan:  N/A  Follow up needed:  Pending Cherene Julian, RPH, 11/18/2021  10:53 AM

## 2021-11-22 NOTE — Progress Notes (Signed)
[name] [name]  Initial Assessment   BONNER LARUE is a 66 y.o. year old male contacted by phone. Clinical Social [name] was referred by nurse navigator for assessment of psychosocial needs.   SDOH (Social Determinants of Health) assessments performed: Yes SDOH Interventions    Flowsheet Row Most Recent Value  SDOH Interventions   Food Insecurity Interventions Intervention Not Indicated  Financial Strain Interventions Intervention Not Indicated  Housing Interventions Intervention Not Indicated  Transportation Interventions Intervention Not Indicated       Distress Screen completed: No No flowsheet data found.    Family/Social Information:  Housing Arrangement: patient lives w/ wife Family members/support persons in your life? 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 yes Concerns about diagnosis and/or treatment: How I will care for other members of my family Patient reported stressors:  pt's mother died of a cardiac event while riding in pt's car and pt's father died two weeks ago of PNA Hopes and priorities: completing treatment w/ no complications Patient enjoys  none identified Current coping skills/ strengths: Supportive family/friends     SUMMARY: Current SDOH Barriers:  None identified  Clinical Social [name] Clinical Goal(s):  verbalize understanding of plan for management of cancer diagnosis   Interventions: Discussed common feeling and emotions when being diagnosed with cancer, and the importance of support during treatment Informed patient of the support team roles and support services at [facility] Provided [place] contact information and encouraged patient to call with any  questions or concerns Provided education regarding advanced directives and programs available through supportive services.   Follow Up Plan: CSW will see patient on 2/23 to provide a copy of programs available through supportive services Patient verbalizes understanding of plan: Yes    Trilby Leaver, LCSW

## 2021-11-23 DIAGNOSIS — Z510 Encounter for antineoplastic radiation therapy: Secondary | ICD-10-CM | POA: Diagnosis not present

## 2021-11-24 ENCOUNTER — Ambulatory Visit
Admission: RE | Admit: 2021-11-24 | Discharge: 2021-11-24 | Disposition: A | Discharge status: Home / Self Care | Payer: No Typology Code available for payment source | Source: Ambulatory Visit | Attending: Radiation Oncology | Admitting: Radiation Oncology

## 2021-11-24 ENCOUNTER — Encounter: Payer: Self-pay | Admitting: Hematology and Oncology

## 2021-11-24 ENCOUNTER — Inpatient Hospital Stay (HOSPITAL_BASED_OUTPATIENT_CLINIC_OR_DEPARTMENT_OTHER): Payer: No Typology Code available for payment source | Admitting: Hematology and Oncology

## 2021-11-24 ENCOUNTER — Other Ambulatory Visit: Payer: Self-pay

## 2021-11-24 ENCOUNTER — Inpatient Hospital Stay: Payer: No Typology Code available for payment source

## 2021-11-24 DIAGNOSIS — C099 Malignant neoplasm of tonsil, unspecified: Secondary | ICD-10-CM | POA: Diagnosis not present

## 2021-11-24 DIAGNOSIS — Z510 Encounter for antineoplastic radiation therapy: Secondary | ICD-10-CM | POA: Diagnosis not present

## 2021-11-24 DIAGNOSIS — R5383 Other fatigue: Secondary | ICD-10-CM

## 2021-11-24 DIAGNOSIS — G893 Neoplasm related pain (acute) (chronic): Secondary | ICD-10-CM

## 2021-11-24 DIAGNOSIS — Z95828 Presence of other vascular implants and grafts: Secondary | ICD-10-CM

## 2021-11-24 LAB — CBC WITH DIFFERENTIAL (CANCER CENTER ONLY)
Abs Immature Granulocytes: 0.03 10*3/uL (ref 0.00–0.07)
Basophils Absolute: 0 10*3/uL (ref 0.0–0.1)
Basophils Relative: 0 %
Eosinophils Absolute: 0.1 10*3/uL (ref 0.0–0.5)
Eosinophils Relative: 1 %
HCT: 41.9 % (ref 39.0–52.0)
Hemoglobin: 14.7 g/dL (ref 13.0–17.0)
Immature Granulocytes: 0 %
Lymphocytes Relative: 16 %
Lymphs Abs: 1.4 10*3/uL (ref 0.7–4.0)
MCH: 33.9 pg (ref 26.0–34.0)
MCHC: 35.1 g/dL (ref 30.0–36.0)
MCV: 96.5 fL (ref 80.0–100.0)
Monocytes Absolute: 0.7 10*3/uL (ref 0.1–1.0)
Monocytes Relative: 9 %
Neutro Abs: 6.3 10*3/uL (ref 1.7–7.7)
Neutrophils Relative %: 74 %
Platelet Count: 155 10*3/uL (ref 150–400)
RBC: 4.34 MIL/uL (ref 4.22–5.81)
RDW: 11.8 % (ref 11.5–15.5)
WBC Count: 8.5 10*3/uL (ref 4.0–10.5)
nRBC: 0 % (ref 0.0–0.2)

## 2021-11-24 LAB — COMPREHENSIVE METABOLIC PANEL
ALT: 43 U/L (ref 0–44)
AST: 82 U/L — ABNORMAL HIGH (ref 15–41)
Albumin: 4.3 g/dL (ref 3.5–5.0)
Alkaline Phosphatase: 91 U/L (ref 38–126)
Anion gap: 8 (ref 5–15)
BUN: 14 mg/dL (ref 8–23)
CO2: 29 mmol/L (ref 22–32)
Calcium: 9.4 mg/dL (ref 8.9–10.3)
Chloride: 96 mmol/L — ABNORMAL LOW (ref 98–111)
Creatinine, Ser: 0.92 mg/dL (ref 0.61–1.24)
GFR, Estimated: >60 mL/min (ref >60)
Glucose, Bld: 153 mg/dL — ABNORMAL HIGH (ref 70–99)
Potassium: 3.6 mmol/L (ref 3.5–5.1)
Sodium: 133 mmol/L — ABNORMAL LOW (ref 135–145)
Total Bilirubin: 1 mg/dL (ref 0.3–1.2)
Total Protein: 7.9 g/dL (ref 6.5–8.1)

## 2021-11-24 LAB — MAGNESIUM: Magnesium: 1.8 mg/dL (ref 1.7–2.4)

## 2021-11-24 MED ORDER — HEPARIN SOD (PORK) LOCK FLUSH 100 UNIT/ML IV SOLN
500.0000 [IU] | INTRAVENOUS | Status: AC | PRN
  Administered 2021-11-24: 500 [IU]

## 2021-11-24 MED ORDER — SODIUM CHLORIDE 0.9% FLUSH
10.0000 mL | INTRAVENOUS | Status: AC | PRN
  Administered 2021-11-24: 10 mL

## 2021-11-24 MED ORDER — ROSUVASTATIN CALCIUM 10 MG PO TABS
10.0000 mg | ORAL_TABLET | Freq: Every day | ORAL | 0 refills | Status: AC
Start: 2021-11-24 — End: ?

## 2021-11-24 MED FILL — Dexamethasone Sodium Phosphate Inj 100 MG/10ML: INTRAMUSCULAR | Qty: 1 | Status: AC

## 2021-11-24 MED FILL — Fosaprepitant Dimeglumine For IV Infusion 150 MG (Base Eq): INTRAVENOUS | Qty: 5 | Status: AC

## 2021-11-24 NOTE — Assessment & Plan Note (Signed)
This is a very pleasant 66 year old male patient, current everyday smoker with newly diagnosed squamous cell carcinoma of the left tonsil, p16 negative, staging T1 versus T2, borderline size, and N2 with bilateral cervical lymphadenopathy which are hypermetabolic on PET imaging and M0 with no evidence of distant metastasis referred to medical oncology for consideration of concurrent chemotherapy and radiation.  Given bilateral cervical lymphadenopathy, we have discussed about considering concurrent chemotherapy and radiation as frontline treatment.  He went to [facility] for a second opinion about role of surgery. He decided to proceed with CRT locally after talking to surgery team. He refused PEG tube placement, port placed and ready for use.  He started his radiation today and is anticipating first cycle of chemotherapy tomorrow.  Labs reviewed okay to proceed with chemotherapy as planned.  He understands very well about the adverse effects of chemotherapy.  We have discussed this in detail.  He will return to clinic every week with labs and for toxicity check.  We have discussed about staying well-hydrated while he is on cisplatin because it is nephrotoxic and he increase in expressed understanding.

## 2021-11-24 NOTE — Progress Notes (Signed)
Unfortunately there aren't any foundations offering copay assistance for his Dx.  I emailed Ailene Ravel and Vincente Liberty in the radiation dept requesting they reach out to the pt regarding the [employer].

## 2021-11-24 NOTE — Progress Notes (Signed)
[place] CONSULT NOTE  Patient Care Team: [name], MD as PCP - General (Family Medicine)  CHIEF COMPLAINTS/PURPOSE OF CONSULTATION:  SCC oropharynx  ASSESSMENT & PLAN:  Squamous cell carcinoma of left tonsil [facility])                    This is a very pleasant 66 year old male patient, current everyday smoker with newly diagnosed squamous cell carcinoma of the left tonsil, p16 negative, staging T1 versus T2, borderline size, and N2 with bilateral cervical lymphadenopathy which are hypermetabolic on PET imaging and M0 with no evidence of distant metastasis referred to medical oncology for consideration of concurrent chemotherapy and radiation.  Given bilateral cervical lymphadenopathy, we have discussed about considering concurrent chemotherapy and radiation as frontline treatment.  [facility] went to [facility] for a second opinion about role of surgery. He decided to proceed with CRT locally after talking to surgery team. He refused PEG tube placement, port placed and ready for use.  He started his radiation today and is anticipating first cycle of chemotherapy tomorrow.  Labs reviewed okay to proceed with chemotherapy as planned.  He understands very well about the adverse effects of chemotherapy.  We have discussed this in detail.  He will return to clinic every week with labs and for toxicity check.  We have discussed about staying well-hydrated while he is on cisplatin because it is nephrotoxic and he increase in expressed understanding.  Cancer related pain Continue on oxycodone as needed for pain control.  He takes about 1 to 2/day.  We will continue to monitor this.  He is tumor related pain might continue to improve once we proceed with few more days of chemoradiation however he may then have mucositis related pain.    No orders of the defined types were placed in this encounter.                                                                                  HISTORY OF  PRESENTING ILLNESS:  [name] [name] 66 y.o. male is here because of SCC oropharynx.  This is a very pleasant 66 year old male patient with past medical history significant for hypertension, dyslipidemia referred to medical oncology with new diagnosis of squamous cell carcinoma of the tonsil.  He was seen by Dr. Benjamine Mola  for chief complaint of sore throat.  Back in July when he had a CT neck, there was no identifiable neck mass or enlarged nodes. He continued to have sore throat and  had a left tonsil biopsy which showed invasive squamous cell carcinoma with ulceration and necrosis, p16 immunostain is negative. PET/CT on 10/18/2018 which showed a hypermetabolic mass centered along the wall of the left posterior lateral oropharynx consistent with known tonsillar neoplasm, hypermetabolic bilateral cervical lymph nodes suspicious for disease involvement.  No evidence of hypermetabolic disease in the chest abdomen or pelvis. He was seen at [facility] for second opinion, recommendation was to proceed with CRT  He agreed to proceed with concurrent chemotherapy radiation locally.  He started radiation today and is anticipating for cycle of chemotherapy tomorrow.  Since his last visit, his pain is much better controlled on oxycodone.  He takes 1 to 2 tablets a day.  No change in swallowing.  No worsening shortness of breath.  Rest of the pertinent 10 point ROS reviewed and negative.                 MEDICAL HISTORY:  Past Medical History:  Diagnosis Date   Acute kidney failure ([place]) 04/2018   CKD (chronic kidney disease), stage II    Complication of anesthesia    DDD (degenerative disc disease), lumbar    History of pancreatitis    20-30 years ago   History of venomous snake bite    rt index finger-   Hypercholesterolemia    Hypertension    Nerve pain    Right leg, resolved with stimulator   PONV (postoperative nausea and vomiting)    S/P insertion of spinal cord stimulator 08/2017   Spinal stenosis     Tobacco abuse     SURGICAL HISTORY: Past Surgical History:  Procedure Laterality Date   COLONOSCOPY     FINGER EXPLORATION  05/2011   rt index finger [id number]   HERNIA REPAIR     as a child   I & D EXTREMITY  11/24/2011   Procedure: IRRIGATION AND DEBRIDEMENT EXTREMITY;  Surgeon: Tennis Must, MD;  Location: [place];  Service: Orthopedics;  Laterality: Right;  right index   IR IMAGING GUIDED PORT INSERTION  11/17/2021   IR US GUIDE VASC ACCESS RIGHT  11/17/2021   MASS EXCISION  01/23/2012   Procedure: EXCISION MASS;  Surgeon: Tennis Must, MD;  Location: [place];  Service: Orthopedics;  Laterality: Right;  Right index a-cell graft   TOTAL HIP ARTHROPLASTY Right 05/03/2018   Procedure: RIGHT TOTAL HIP ARTHROPLASTY ANTERIOR APPROACH;  Surgeon: Rod Can, MD;  Location: [facility];  Service: Orthopedics;  Laterality: Right;  Needs RNFA    SOCIAL HISTORY: Social History   Socioeconomic History   Marital status: Married    Spouse name: Not on file   Number of children: Not on file   Years of education: Not on file   Highest education level: Not on file  Occupational History   Not on file  Tobacco Use   Smoking status: Every Day    Packs/day: 0.50    Years: 20.00    Pack years: 10.00    Types: Cigarettes   Smokeless tobacco: Never  Vaping Use   Vaping Use: Never used  Substance and Sexual Activity   Alcohol use: Yes    Comment: occasional   Drug use: No   Sexual activity: Not Currently  Other Topics Concern   Not on file  Social History Narrative   Not on file   Social Determinants of Health   Financial Resource Strain: Low Risk    Difficulty of Paying Living Expenses: Not hard at all  Food Insecurity: No Food Insecurity   Worried About Charity fundraiser in the Last Year: Never true   Ran Out of Food in the Last Year: Never true  Transportation Needs: No Transportation Needs   Lack of Transportation (Medical): No   Lack of  Transportation (Non-Medical): No  Physical Activity: Not on file  Stress: Not on file  Social Connections: Not on file  Intimate Partner Violence: Not on file    FAMILY HISTORY: Family History  Problem Relation Age of Onset   Diabetes Mother    Throat cancer Brother     ALLERGIES:  is allergic to codeine.  MEDICATIONS:  Current Outpatient Medications  Medication Sig Dispense Refill   dexamethasone (DECADRON) 4 MG tablet Take 2 tablets (8 mg total) by mouth daily. Take daily x 3 days starting the day after cisplatin chemotherapy. Take with food. 30 tablet 1   lidocaine-prilocaine (EMLA) cream Apply to affected area once 30 g 3   LORazepam (ATIVAN) 0.5 MG tablet Take 1 tablet (0.5 mg total) by mouth every 6 (six) hours as needed (Nausea or vomiting). 30 tablet 0   Multiple Vitamins-Minerals (CENTRUM ULTRA MENS) TABS Take 1 tablet by mouth daily.       olmesartan-hydrochlorothiazide (BENICAR HCT) 40-25 MG per tablet Take 1 tablet by mouth daily.       ondansetron (ZOFRAN) 8 MG tablet Take 1 tablet (8 mg total) by mouth 2 (two) times daily as needed. Start on the third day after cisplatin chemotherapy. 30 tablet 1   oxyCODONE (OXY IR/ROXICODONE) 5 MG immediate release tablet Take 1 tablet (5 mg total) by mouth every 4 (four) hours as needed for severe pain. 30 tablet 0   prochlorperazine (COMPAZINE) 10 MG tablet Take 1 tablet (10 mg total) by mouth every 6 (six) hours as needed (Nausea or vomiting). 30 tablet 1   rosuvastatin (CRESTOR) 10 MG tablet Take 1 tablet (10 mg total) by mouth daily. 90 tablet 0   No current facility-administered medications for this visit.     PHYSICAL EXAMINATION: ECOG PERFORMANCE STATUS: 0 - Asymptomatic  Vitals:   11/24/21 1504  BP: 100/66  Pulse: 68  Resp: 16  Temp: (!) 97.5 F (36.4 C)  SpO2: 100%    Filed Weights   11/24/21 1504  Weight: 157 lb (71.2 kg)    Physical Exam Constitutional:      Appearance: Normal appearance.  HENT:      Head: Normocephalic and atraumatic.  Cardiovascular:     Rate and Rhythm: Normal rate and regular rhythm.     Pulses: Normal pulses.     Heart sounds: Normal heart sounds.  Pulmonary:     Effort: Pulmonary effort is normal.     Breath sounds: Normal breath sounds.  Abdominal:     General: Abdomen is flat. Bowel sounds are normal.     Palpations: Abdomen is soft.  Musculoskeletal:     Cervical back: Normal range of motion and neck supple. No rigidity.  Lymphadenopathy:     Cervical: No cervical adenopathy.  Skin:    General: Skin is warm and dry.  Neurological:     General: No focal deficit present.     Mental Status: He is alert.     LABORATORY DATA:  I have reviewed the data as listed Lab Results  Component Value Date   WBC 8.5 11/24/2021   HGB 14.7 11/24/2021   HCT 41.9 11/24/2021   MCV 96.5 11/24/2021   PLT 155 11/24/2021     Chemistry      Component Value Date/Time   NA 133 (L) 11/24/2021 1446   K 3.6 11/24/2021 1446   CL 96 (L) 11/24/2021 1446   CO2 29 11/24/2021 1446   BUN 14 11/24/2021 1446   CREATININE 0.92 11/24/2021 1446   CREATININE 0.61 10/25/2021 1053      Component Value Date/Time   CALCIUM 9.4 11/24/2021 1446   ALKPHOS 91 11/24/2021 1446   AST 82 (H) 11/24/2021 1446   AST 72 (H) 10/25/2021 1053   ALT 43 11/24/2021 1446   ALT 48 (H) 10/25/2021 1053   BILITOT 1.0 11/24/2021 1446  BILITOT 1.0 10/25/2021 1053       RADIOGRAPHIC STUDIES: I have personally reviewed the radiological images as listed and agreed with the findings in the report. IR US Guide Vasc Access Right  Result Date: 11/22/2021 INDICATION: Head and neck malignancy EXAM: IMPLANTED PORT A CATH PLACEMENT WITH ULTRASOUND AND FLUOROSCOPIC GUIDANCE MEDICATIONS: None ANESTHESIA/SEDATION: Local anesthetic and single agent sedation was employed during this procedure. A total of Versed 1 mg was administered intravenously. The patient's level of consciousness and vital signs were monitored  continuously by radiology nursing throughout the procedure under my direct supervision. FLUOROSCOPY TIME:  0.1 minutes (1 mGy) COMPLICATIONS: None immediate. PROCEDURE: The procedure, risks, benefits, and alternatives were explained to the patient. Questions regarding the procedure were encouraged and answered. The patient understands and consents to the procedure. The neck and chest were prepped with chlorhexidine in a sterile fashion, and a sterile drape was applied covering the operative field. Maximum barrier sterile technique with sterile gowns and gloves were used for the procedure. A timeout was performed prior to the initiation of the procedure. Local anesthesia was provided with 1% lidocaine with epinephrine. After creating a small venotomy incision, a micropuncture kit was utilized to access the internal jugular vein under direct, real-time ultrasound guidance. Ultrasound image documentation was performed. The microwire was kinked to measure appropriate catheter length. A subcutaneous port pocket was then created along the upper chest wall utilizing a combination of sharp and blunt dissection. The pocket was irrigated with sterile saline. A single lumen ISP power injectable port was chosen for placement. The 8 Fr catheter was tunneled from the port pocket site to the venotomy incision. The port was placed in the pocket. The external catheter was trimmed to appropriate length. At the venotomy, an 8 Fr peel-away sheath was placed over a guidewire under fluoroscopic guidance. The catheter was then placed through the sheath and the sheath was removed. Final catheter positioning was confirmed and documented with a fluoroscopic spot radiograph. The port was accessed with a Huber needle, aspirated and flushed with heparinized saline. The port pocket incision was closed with interrupted 3-0 Vicryl suture then Dermabond was applied, including at the venotomy incision. Dressings were placed. The patient tolerated  the procedure well without immediate post procedural complication. IMPRESSION: Successful placement of a RIGHT internal jugular approach power injectable Port-A-Cath. The tip of the catheter is positioned within the proximal RIGHT atrium. The catheter is ready for immediate use. Michaelle Birks, MD [facility] Radiology Electronically Signed   By: Michaelle Birks M.D.   On: 11/22/2021 11:04   IR IMAGING GUIDED PORT INSERTION  Result Date: 11/17/2021 INDICATION: Head and neck malignancy EXAM: IMPLANTED PORT A CATH PLACEMENT WITH ULTRASOUND AND FLUOROSCOPIC GUIDANCE MEDICATIONS: None ANESTHESIA/SEDATION: Local anesthetic and single agent sedation was employed during this procedure. A total of Versed 1 mg was administered intravenously. The patient's level of consciousness and vital signs were monitored continuously by radiology nursing throughout the procedure under my direct supervision. FLUOROSCOPY TIME:  0.1 minutes (1 mGy) COMPLICATIONS: None immediate. PROCEDURE: The procedure, risks, benefits, and alternatives were explained to the patient. Questions regarding the procedure were encouraged and answered. The patient understands and consents to the procedure. The neck and chest were prepped with chlorhexidine in a sterile fashion, and a sterile drape was applied covering the operative field. Maximum barrier sterile technique with sterile gowns and gloves were used for the procedure. A timeout was performed prior to the initiation of the procedure. Local  anesthesia was provided with 1% lidocaine with epinephrine. After creating a small venotomy incision, a micropuncture kit was utilized to access the internal jugular vein under direct, real-time ultrasound guidance. Ultrasound image documentation was performed. The microwire was kinked to measure appropriate catheter length. A subcutaneous port pocket was then created along the upper chest wall utilizing a combination  of sharp and blunt dissection. The pocket was irrigated with sterile saline. A single lumen ISP power injectable port was chosen for placement. The 8 Fr catheter was tunneled from the port pocket site to the venotomy incision. The port was placed in the pocket. The external catheter was trimmed to appropriate length. At the venotomy, an 8 Fr peel-away sheath was placed over a guidewire under fluoroscopic guidance. The catheter was then placed through the sheath and the sheath was removed. Final catheter positioning was confirmed and documented with a fluoroscopic spot radiograph. The port was accessed with a Huber needle, aspirated and flushed with heparinized saline. The port pocket incision was closed with interrupted 3-0 Vicryl suture then Dermabond was applied, including at the venotomy incision. Dressings were placed. The patient tolerated the procedure well without immediate post procedural complication. IMPRESSION: Successful placement of a RIGHT internal jugular approach power injectable Port-A-Cath. The tip of the catheter is positioned within the proximal RIGHT atrium. The catheter is ready for immediate use. Michaelle Birks, MD [facility] Radiology Electronically Signed   By: Michaelle Birks M.D.   On: 11/17/2021 16:52    All questions were answered. The patient knows to call the clinic with any problems, questions or concerns. I spent 20 minutes in the care of this patient including H and P, review of records, counseling and coordination of care.     Benay Pike, MD 11/24/2021 4:46 PM

## 2021-11-24 NOTE — Progress Notes (Signed)
Oncology Nurse Navigator Documentation   To provide support, encouragement and care continuity, met with Mr. [name] [name] for his initial RT. I reviewed the 2-step treatment process, answered questions.  Mr. [name] [name] completed treatment without difficulty, denied questions/concerns. I reviewed the registration/arrival procedure for subsequent treatments. I walked with him upstairs to check in for his labs appointment and his appointment with Dr. Chryl Heck.  I encouraged him to call me with questions/concerns as tmts proceed.   Harlow Asa RN, BSN, OCN Head & Neck Oncology Nurse [place] [facility] Phone # [phone]  Fax # [phone]

## 2021-11-24 NOTE — Assessment & Plan Note (Signed)
Continue on oxycodone as needed for pain control.  He takes about 1 to 2/day.  We will continue to monitor this.  He is tumor related pain might continue to improve once we proceed with few more days of chemoradiation however he may then have mucositis related pain.

## 2021-11-25 ENCOUNTER — Inpatient Hospital Stay: Payer: No Typology Code available for payment source | Admitting: Dietician

## 2021-11-25 ENCOUNTER — Inpatient Hospital Stay: Payer: No Typology Code available for payment source

## 2021-11-25 ENCOUNTER — Encounter: Payer: Self-pay | Admitting: Licensed Clinical Social Worker

## 2021-11-25 ENCOUNTER — Ambulatory Visit
Admission: RE | Admit: 2021-11-25 | Discharge: 2021-11-25 | Disposition: A | Discharge status: Home / Self Care | Payer: No Typology Code available for payment source | Source: Ambulatory Visit | Attending: Radiation Oncology | Admitting: Radiation Oncology

## 2021-11-25 VITALS — BP 114/74 | HR 62 | Temp 97.8°F | Resp 17

## 2021-11-25 DIAGNOSIS — Z510 Encounter for antineoplastic radiation therapy: Secondary | ICD-10-CM | POA: Diagnosis not present

## 2021-11-25 DIAGNOSIS — C099 Malignant neoplasm of tonsil, unspecified: Secondary | ICD-10-CM

## 2021-11-25 LAB — TSH: TSH: 1.099 u[IU]/mL (ref 0.320–4.118)

## 2021-11-25 MED ORDER — SODIUM CHLORIDE 0.9 % IV SOLN
40.0000 mg/m2 | Freq: Once | INTRAVENOUS | Status: AC
  Administered 2021-11-25: 74 mg via INTRAVENOUS
  Filled 2021-11-25: qty 74

## 2021-11-25 MED ORDER — SODIUM CHLORIDE 0.9% FLUSH
10.0000 mL | INTRAVENOUS | Status: DC | PRN
  Administered 2021-11-25: 10 mL

## 2021-11-25 MED ORDER — SODIUM CHLORIDE 0.9 % IV SOLN
10.0000 mg | Freq: Once | INTRAVENOUS | Status: AC
  Administered 2021-11-25: 10 mg via INTRAVENOUS
  Filled 2021-11-25: qty 10

## 2021-11-25 MED ORDER — POTASSIUM CHLORIDE IN NACL 20-0.9 MEQ/L-% IV SOLN
Freq: Once | INTRAVENOUS | Status: AC
  Filled 2021-11-25: qty 1000

## 2021-11-25 MED ORDER — MAGNESIUM SULFATE 2 GM/50ML IV SOLN
2.0000 g | Freq: Once | INTRAVENOUS | Status: AC
  Administered 2021-11-25: 2 g via INTRAVENOUS
  Filled 2021-11-25: qty 50

## 2021-11-25 MED ORDER — PALONOSETRON HCL INJECTION 0.25 MG/5ML
0.2500 mg | Freq: Once | INTRAVENOUS | Status: AC
  Administered 2021-11-25: 0.25 mg via INTRAVENOUS
  Filled 2021-11-25: qty 5

## 2021-11-25 MED ORDER — SODIUM CHLORIDE 0.9 % IV SOLN: Freq: Once | INTRAVENOUS | Status: AC

## 2021-11-25 MED ORDER — SODIUM CHLORIDE 0.9 % IV SOLN
150.0000 mg | Freq: Once | INTRAVENOUS | Status: AC
  Administered 2021-11-25: 150 mg via INTRAVENOUS
  Filled 2021-11-25: qty 150

## 2021-11-25 MED ORDER — HEPARIN SOD (PORK) LOCK FLUSH 100 UNIT/ML IV SOLN
500.0000 [IU] | Freq: Once | INTRAVENOUS | Status: AC | PRN
  Administered 2021-11-25: 500 [IU]

## 2021-11-25 NOTE — Progress Notes (Addendum)
Nutrition Assessment   Reason for Assessment: Head and Neck   ASSESSMENT: 66 year old male with SCC of left tonsil. Patient is receiving concurrent chemoradiation with weekly cisplatin (first chemotherapy today). Patient is under the care of Dr. Chryl Heck and Dr. Isidore Moos. He has declined PEG placement.   Past medical history includes CKD stage II, HTN, hypercholesteremia, spinal stenosis, current tobacco use.  Met with patient during infusion. He reports slight nausea this morning, now resolved. He attributes this to drinking a lot of water yesterday "as he was instructed" and nerves about first chemotherapy. Patient reports good appetite and eating as normal. He typically eats 2 good meals. Breakfast is usually bowl of cereal with whole milk. Occasionally will eat leftovers if he has a lunch meal. Yesterday he ate a cheeseburger he prepared the night before. Patient eats a variety of foods for dinner (steak, chicken, seafood, baked potatoes, fried vegetables). Patient is drinking water, gatorade, diet sundrop. Patient denies swallowing difficulties. He does reports sore throat. This affects his sleep more than anything. Patient denies constipation, diarrhea, dry mouth, thick saliva.    Nutrition Focused Physical Exam: deferred   Medications: oxycodone, decadron, ativan, MVI, zofran   Labs: 2/22 - Na 133, Glucose 153, AST 82   Anthropometrics: Weights have decreased 7 lb (4.3%) in 8 days. Patient weighed 164 lb 4 oz on 2/13. This is significant  Height: 5'8" Weight: 157 lb (2/22) UBW: 147-165 lb (per pt wt can fluctuate ~10 within a month)  BMI: 23.87   Estimated Energy Needs  Kcals: 2136-2492 Protein: 93-114 Fluid: 2.4 L   NUTRITION DIAGNOSIS: Inadequate oral intake related to [facility] of left tonsil and associated treatments as evidenced by intake meeting <75% of estimated needs per dietary recall, 4.3% wt decrease in the last 8 days which is significant for time frame. Patient is at  high risk for developing malnutrition.    INTERVENTION:  Patient has declined PEG at this time, will continue working with patient to avoid need for feeding tube  Educated on importance of adequate calories and protein to maintain weights/strength Discussed adding high calorie, high protein snacks in between meals - handout with ideas and shake recipes provided Suggested pt consider drinking oral nutrition supplements for added calories/protein - samples provided for pt to try  Discussed strategies for sore throat and soft moist high protein foods that are easy to chew/swallow - handouts provided Recommend baking soda, salt water rinses several times daily - recipe provided Contact information provided   MONITORING, EVALUATION, GOAL: Patient will tolerate increased calories and protein to minimize weight loss    Next Visit: Thursday March 2 during infusion with Pamala Hurry

## 2021-11-25 NOTE — Progress Notes (Signed)
[facility] CSW Progress Note  Holiday representative met with patient to provide information on supportive services available.  CSW to continue to provide emotional support throughout duration of treatment as appropriate.    Henriette Combs , LCSW

## 2021-11-25 NOTE — Patient Instructions (Signed)
[facility] MEDICAL [facility]COLOGY  Discharge Instructions: °Thank you for choosing [facility] to provide your oncology and hematology care.  ° °If you have a lab appointment with the Cancer Center, please go directly to the Cancer Center and check in at the registration area. °  °Wear comfortable clothing and clothing appropriate for easy access to any Portacath or PICC line.  ° °We strive to give you quality time with your provider. You may need to reschedule your appointment if you arrive late (15 or more minutes).  Arriving late affects you and other patients whose appointments are after yours.  Also, if you miss three or more appointments without notifying the office, you may be dismissed from the clinic at the provider’s discretion.    °  °For prescription refill requests, have your pharmacy contact our office and allow 72 hours for refills to be completed.   ° °Today you received the following chemotherapy and/or immunotherapy agents Cisplatin    °  °To help prevent nausea and vomiting after your treatment, we encourage you to take your nausea medication as directed. ° °BELOW ARE SYMPTOMS THAT SHOULD BE REPORTED IMMEDIATELY: °*FEVER GREATER THAN 100.4 F (38 °C) OR HIGHER °*CHILLS OR SWEATING °*NAUSEA AND VOMITING THAT IS NOT CONTROLLED WITH YOUR NAUSEA MEDICATION °*UNUSUAL SHORTNESS OF BREATH °*UNUSUAL BRUISING OR BLEEDING °*URINARY PROBLEMS (pain or burning when urinating, or frequent urination) °*BOWEL PROBLEMS (unusual diarrhea, constipation, pain near the anus) °TENDERNESS IN MOUTH AND THROAT WITH OR WITHOUT PRESENCE OF ULCERS (sore throat, sores in mouth, or a toothache) °UNUSUAL RASH, SWELLING OR PAIN  °UNUSUAL VAGINAL DISCHARGE OR ITCHING  ° °Items with * indicate a potential emergency and should be followed up as soon as possible or go to the Emergency Department if any problems should occur. ° °Please show the CHEMOTHERAPY ALERT CARD or IMMUNOTHERAPY ALERT CARD at check-in to  the Emergency Department and triage nurse. ° °Should you have questions after your visit or need to cancel or reschedule your appointment, please contact [facility]  Dept: [phone]  and follow the prompts.  Office hours are 8:00 a.m. to 4:30 p.m. Monday - Friday. Please note that voicemails left after 4:00 p.m. may not be returned until the following business day.  We are closed weekends and major holidays. You have access to a nurse at all times for urgent questions. Please call the main number to the clinic Dept: [phone] and follow the prompts. ° ° °For any non-urgent questions, you may also contact your provider using MyChart. We now offer e-Visits for anyone 18 and older to request care online for non-urgent symptoms. For details visit [email or web address]. °  °Also download the MyChart app! Go to the app store, search "MyChart", open the app, select [facility], and log in with your MyChart username and password. ° °Due to Covid, a mask is required upon entering the hospital/clinic. If you do not have a mask, one will be given to you upon arrival. For doctor visits, patients may have 1 support person aged 18 or older with them. For treatment visits, patients cannot have anyone with them due to current Covid guidelines and our immunocompromised population.  ° °Cisplatin injection °What is this medication? °CISPLATIN (SIS pla tin) is a chemotherapy drug. It targets fast dividing cells, like cancer cells, and causes these cells to die. This medicine is used to treat many types of cancer like bladder, ovarian, and testicular cancers. °This medicine may be used   for other purposes; ask your health care provider or pharmacist if you have questions. °COMMON BRAND NAME(S): Platinol, Platinol -AQ °What should I tell my care team before I take this medication? °They need to know if you have any of these conditions: °eye disease, vision problems °hearing problems °kidney  disease °low blood counts, like white cells, platelets, or red blood cells °tingling of the fingers or toes, or other nerve disorder °an unusual or allergic reaction to cisplatin, carboplatin, oxaliplatin, other medicines, foods, dyes, or preservatives °pregnant or trying to get pregnant °breast-feeding °How should I use this medication? °This drug is given as an infusion into a vein. It is administered in a hospital or clinic by a specially trained health care professional. °Talk to your pediatrician regarding the use of this medicine in children. Special care may be needed. °Overdosage: If you think you have taken too much of this medicine contact a poison control center or emergency room at once. °NOTE: This medicine is only for you. Do not share this medicine with others. °What if I miss a dose? °It is important not to miss a dose. Call your doctor or health care professional if you are unable to keep an appointment. °What may interact with this medication? °This medicine may interact with the following medications: °foscarnet °certain antibiotics like amikacin, gentamicin, neomycin, polymyxin B, streptomycin, tobramycin, vancomycin °This list may not describe all possible interactions. Give your health care provider a list of all the medicines, herbs, non-prescription drugs, or dietary supplements you use. Also tell them if you smoke, drink alcohol, or use illegal drugs. Some items may interact with your medicine. °What should I watch for while using this medication? °Your condition will be monitored carefully while you are receiving this medicine. You will need important blood work done while you are taking this medicine. °This drug may make you feel generally unwell. This is not uncommon, as chemotherapy can affect healthy cells as well as cancer cells. Report any side effects. Continue your course of treatment even though you feel ill unless your doctor tells you to stop. °This medicine may increase your  risk of getting an infection. Call your healthcare professional for advice if you get a fever, chills, or sore throat, or other symptoms of a cold or flu. Do not treat yourself. Try to avoid being around people who are sick. °Avoid taking medicines that contain aspirin, acetaminophen, ibuprofen, naproxen, or ketoprofen unless instructed by your healthcare professional. These medicines may hide a fever. °This medicine may increase your risk to bruise or bleed. Call your doctor or health care professional if you notice any unusual bleeding. °Be careful brushing and flossing your teeth or using a toothpick because you may get an infection or bleed more easily. If you have any dental work done, tell your dentist you are receiving this medicine. °Do not become pregnant while taking this medicine or for 14 months after stopping it. Women should inform their healthcare professional if they wish to become pregnant or think they might be pregnant. Men should not father a child while taking this medicine and for 11 months after stopping it. There is potential for serious side effects to an unborn child. Talk to your healthcare professional for more information. °Do not breast-feed an infant while taking this medicine. °This medicine has caused ovarian failure in some women. This medicine may make it more difficult to get pregnant. Talk to your healthcare professional if you are concerned about your fertility. °This medicine has caused   decreased sperm counts in some men. This may make it more difficult to father a child. Talk to your healthcare professional if you are concerned about your fertility. °Drink fluids as directed while you are taking this medicine. This will help protect your kidneys. °Call your doctor or health care professional if you get diarrhea. Do not treat yourself. °What side effects may I notice from receiving this medication? °Side effects that you should report to your doctor or health care professional  as soon as possible: °allergic reactions like skin rash, itching or hives, swelling of the face, lips, or tongue °blurred vision °changes in vision °decreased hearing or ringing of the ears °nausea, vomiting °pain, redness, or irritation at site where injected °pain, tingling, numbness in the hands or feet °signs and symptoms of bleeding such as bloody or black, tarry stools; red or dark brown urine; spitting up blood or brown material that looks like coffee grounds; red spots on the skin; unusual bruising or bleeding from the eyes, gums, or nose °signs and symptoms of infection like fever; chills; cough; sore throat; pain or trouble passing urine °signs and symptoms of kidney injury like trouble passing urine or change in the amount of urine °signs and symptoms of low red blood cells or anemia such as unusually weak or tired; feeling faint or lightheaded; falls; breathing problems °Side effects that usually do not require medical attention (report to your doctor or health care professional if they continue or are bothersome): °loss of appetite °mouth sores °muscle cramps °This list may not describe all possible side effects. Call your doctor for medical advice about side effects. You may report side effects to FDA at 1-800-FDA-1088. °Where should I keep my medication? °This drug is given in a hospital or clinic and will not be stored at home. °NOTE: This sheet is a summary. It may not cover all possible information. If you have questions about this medicine, talk to your doctor, pharmacist, or health care provider. °© 2022 [employer] (2021-06-08 00:00:00) ° ° °

## 2021-11-26 ENCOUNTER — Other Ambulatory Visit: Payer: Self-pay

## 2021-11-26 ENCOUNTER — Ambulatory Visit
Admission: RE | Admit: 2021-11-26 | Discharge: 2021-11-26 | Disposition: A | Discharge status: Home / Self Care | Payer: No Typology Code available for payment source | Source: Ambulatory Visit | Attending: Radiation Oncology | Admitting: Radiation Oncology

## 2021-11-26 DIAGNOSIS — Z510 Encounter for antineoplastic radiation therapy: Secondary | ICD-10-CM | POA: Diagnosis not present

## 2021-11-29 ENCOUNTER — Ambulatory Visit
Admission: RE | Admit: 2021-11-29 | Discharge: 2021-11-29 | Disposition: A | Discharge status: Home / Self Care | Payer: No Typology Code available for payment source | Source: Ambulatory Visit | Attending: Radiation Oncology | Admitting: Radiation Oncology

## 2021-11-29 ENCOUNTER — Telehealth: Payer: Self-pay | Admitting: *Deleted

## 2021-11-29 ENCOUNTER — Other Ambulatory Visit: Payer: Self-pay

## 2021-11-29 ENCOUNTER — Other Ambulatory Visit: Payer: Self-pay | Admitting: Radiation Oncology

## 2021-11-29 DIAGNOSIS — C103 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: Secondary | ICD-10-CM

## 2021-11-29 DIAGNOSIS — Z510 Encounter for antineoplastic radiation therapy: Secondary | ICD-10-CM | POA: Diagnosis not present

## 2021-11-29 MED ORDER — LIDOCAINE VISCOUS HCL 2 % MT SOLN: OROMUCOSAL | 3 refills | Status: DC

## 2021-11-29 MED ORDER — SONAFINE EX EMUL
1.0000 "application " | Freq: Two times a day (BID) | CUTANEOUS | Status: DC
  Administered 2021-11-29: 1 via TOPICAL

## 2021-11-29 NOTE — Telephone Encounter (Signed)
This RN called pt per staff notes stating pt has been experiencing hiccups since his treatment- including interfering with his sleep.  Pt not available- but spoke with his wife - she states hiccups are improving presently with noted ability to eat better.  She will discuss with the pt and if symptoms still concerning he is to call this RN to discuss further.  Noted pt is scheduled for post treatment  MD appt this Wednesday- this RN asked wife to make sure hiccups are discussed for future treatment for better control.

## 2021-11-29 NOTE — Progress Notes (Signed)
Pt here for patient teaching.    Pt given Radiation and You booklet, Managing Acute Radiation Side Effects for Head and Neck Cancer handout, skin care instructions, and Sonafine.    Reviewed areas of pertinence such as fatigue, hair loss, mouth changes, skin changes, throat changes, earaches, and taste changes .   Pt able to give teach back of to pat skin, use unscented/gentle soap, and drink plenty of water,apply Sonafine bid, avoid applying anything to skin within 4 hours of treatment, and to use an electric razor if they must shave.   Pt demonstrated understanding and verbalizes understanding of information given and will contact nursing with any questions or concerns.    Http://rtanswers.org/treatmentinformation/whattoexpect/index         

## 2021-11-29 NOTE — Telephone Encounter (Signed)
See other note per this RN contacting pt's wife.

## 2021-11-29 NOTE — Telephone Encounter (Signed)
-----   Message from Dionne Ano, RN sent at 11/25/2021  2:42 PM EST ----- Regarding: Dr. Chryl Heck 1st time cisplatin call back 2/26

## 2021-11-30 ENCOUNTER — Ambulatory Visit
Admission: RE | Admit: 2021-11-30 | Discharge: 2021-11-30 | Disposition: A | Discharge status: Home / Self Care | Payer: No Typology Code available for payment source | Source: Ambulatory Visit | Attending: Radiation Oncology | Admitting: Radiation Oncology

## 2021-11-30 DIAGNOSIS — Z510 Encounter for antineoplastic radiation therapy: Secondary | ICD-10-CM | POA: Diagnosis not present

## 2021-12-01 ENCOUNTER — Inpatient Hospital Stay (HOSPITAL_BASED_OUTPATIENT_CLINIC_OR_DEPARTMENT_OTHER): Payer: No Typology Code available for payment source | Admitting: Hematology and Oncology

## 2021-12-01 ENCOUNTER — Other Ambulatory Visit: Payer: Self-pay

## 2021-12-01 ENCOUNTER — Ambulatory Visit
Admission: RE | Admit: 2021-12-01 | Discharge: 2021-12-01 | Disposition: A | Discharge status: Home / Self Care | Payer: No Typology Code available for payment source | Source: Ambulatory Visit | Attending: Radiation Oncology | Admitting: Radiation Oncology

## 2021-12-01 ENCOUNTER — Inpatient Hospital Stay: Payer: No Typology Code available for payment source

## 2021-12-01 ENCOUNTER — Encounter: Payer: Self-pay | Admitting: Hematology and Oncology

## 2021-12-01 VITALS — BP 104/60 | HR 78 | Temp 97.7°F | Resp 16 | Ht 68.0 in | Wt 164.7 lb

## 2021-12-01 DIAGNOSIS — Z5111 Encounter for antineoplastic chemotherapy: Secondary | ICD-10-CM | POA: Insufficient documentation

## 2021-12-01 DIAGNOSIS — R066 Hiccough: Secondary | ICD-10-CM | POA: Insufficient documentation

## 2021-12-01 DIAGNOSIS — G893 Neoplasm related pain (acute) (chronic): Secondary | ICD-10-CM | POA: Diagnosis not present

## 2021-12-01 DIAGNOSIS — F17210 Nicotine dependence, cigarettes, uncomplicated: Secondary | ICD-10-CM | POA: Insufficient documentation

## 2021-12-01 DIAGNOSIS — Z95828 Presence of other vascular implants and grafts: Secondary | ICD-10-CM

## 2021-12-01 DIAGNOSIS — T50905A Adverse effect of unspecified drugs, medicaments and biological substances, initial encounter: Secondary | ICD-10-CM | POA: Insufficient documentation

## 2021-12-01 DIAGNOSIS — Z808 Family history of malignant neoplasm of other organs or systems: Secondary | ICD-10-CM | POA: Insufficient documentation

## 2021-12-01 DIAGNOSIS — I129 Hypertensive chronic kidney disease with stage 1 through stage 4 chronic kidney disease, or unspecified chronic kidney disease: Secondary | ICD-10-CM | POA: Diagnosis not present

## 2021-12-01 DIAGNOSIS — Z510 Encounter for antineoplastic radiation therapy: Secondary | ICD-10-CM | POA: Diagnosis not present

## 2021-12-01 DIAGNOSIS — R112 Nausea with vomiting, unspecified: Secondary | ICD-10-CM | POA: Insufficient documentation

## 2021-12-01 DIAGNOSIS — D696 Thrombocytopenia, unspecified: Secondary | ICD-10-CM | POA: Diagnosis not present

## 2021-12-01 DIAGNOSIS — N182 Chronic kidney disease, stage 2 (mild): Secondary | ICD-10-CM | POA: Insufficient documentation

## 2021-12-01 DIAGNOSIS — K1232 Oral mucositis (ulcerative) due to other drugs: Secondary | ICD-10-CM | POA: Insufficient documentation

## 2021-12-01 DIAGNOSIS — C099 Malignant neoplasm of tonsil, unspecified: Secondary | ICD-10-CM

## 2021-12-01 DIAGNOSIS — C103 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: Secondary | ICD-10-CM | POA: Insufficient documentation

## 2021-12-01 DIAGNOSIS — T451X5A Adverse effect of antineoplastic and immunosuppressive drugs, initial encounter: Secondary | ICD-10-CM | POA: Insufficient documentation

## 2021-12-01 LAB — COMPREHENSIVE METABOLIC PANEL
ALT: 95 U/L — ABNORMAL HIGH (ref 0–44)
AST: 44 U/L — ABNORMAL HIGH (ref 15–41)
Albumin: 3.6 g/dL (ref 3.5–5.0)
Alkaline Phosphatase: 83 U/L (ref 38–126)
Anion gap: 5 (ref 5–15)
BUN: 17 mg/dL (ref 8–23)
CO2: 29 mmol/L (ref 22–32)
Calcium: 9.4 mg/dL (ref 8.9–10.3)
Chloride: 100 mmol/L (ref 98–111)
Creatinine, Ser: 0.78 mg/dL (ref 0.61–1.24)
GFR, Estimated: >60 mL/min (ref >60)
Glucose, Bld: 247 mg/dL — ABNORMAL HIGH (ref 70–99)
Potassium: 4.5 mmol/L (ref 3.5–5.1)
Sodium: 134 mmol/L — ABNORMAL LOW (ref 135–145)
Total Bilirubin: 0.5 mg/dL (ref 0.3–1.2)
Total Protein: 6.3 g/dL — ABNORMAL LOW (ref 6.5–8.1)

## 2021-12-01 LAB — CBC WITH DIFFERENTIAL/PLATELET
Abs Immature Granulocytes: 0.06 10*3/uL (ref 0.00–0.07)
Basophils Absolute: 0 10*3/uL (ref 0.0–0.1)
Basophils Relative: 0 %
Eosinophils Absolute: 0.2 10*3/uL (ref 0.0–0.5)
Eosinophils Relative: 3 %
HCT: 38.7 % — ABNORMAL LOW (ref 39.0–52.0)
Hemoglobin: 13.4 g/dL (ref 13.0–17.0)
Immature Granulocytes: 1 %
Lymphocytes Relative: 19 %
Lymphs Abs: 1.4 10*3/uL (ref 0.7–4.0)
MCH: 33.7 pg (ref 26.0–34.0)
MCHC: 34.6 g/dL (ref 30.0–36.0)
MCV: 97.2 fL (ref 80.0–100.0)
Monocytes Absolute: 1.2 10*3/uL — ABNORMAL HIGH (ref 0.1–1.0)
Monocytes Relative: 17 %
Neutro Abs: 4.3 10*3/uL (ref 1.7–7.7)
Neutrophils Relative %: 60 %
Platelets: 170 10*3/uL (ref 150–400)
RBC: 3.98 MIL/uL — ABNORMAL LOW (ref 4.22–5.81)
RDW: 11.3 % — ABNORMAL LOW (ref 11.5–15.5)
WBC: 7.1 10*3/uL (ref 4.0–10.5)
nRBC: 0 % (ref 0.0–0.2)

## 2021-12-01 LAB — MAGNESIUM: Magnesium: 1.7 mg/dL (ref 1.7–2.4)

## 2021-12-01 MED ORDER — SODIUM CHLORIDE 0.9% FLUSH
10.0000 mL | INTRAVENOUS | Status: DC | PRN
  Administered 2021-12-01: 10 mL via INTRAVENOUS

## 2021-12-01 MED ORDER — HEPARIN SOD (PORK) LOCK FLUSH 100 UNIT/ML IV SOLN
500.0000 [IU] | Freq: Once | INTRAVENOUS | Status: AC
  Administered 2021-12-01: 500 [IU] via INTRAVENOUS

## 2021-12-01 NOTE — Assessment & Plan Note (Signed)
Hiccups likely related to dexamethasone, will decrease the dose of dexamethasone to 1 tablet instead of 2 tablets.  He can take Thorazine if needed but he currently does not have any ongoing hiccups so we will hold off on this. ?

## 2021-12-01 NOTE — Assessment & Plan Note (Signed)
This is a very pleasant 66 year old male patient, current everyday smoker with newly diagnosed squamous cell carcinoma of the left tonsil, p16 negative, staging T1 versus T2, borderline size, and N2 with bilateral cervical lymphadenopathy which are hypermetabolic on PET imaging and M0 with no evidence of distant metastasis referred to medical oncology for consideration of concurrent chemotherapy and radiation.  Given bilateral cervical lymphadenopathy, we have discussed about considering concurrent chemotherapy and radiation as frontline treatment.  ?He went to [facility] for a second opinion about role of surgery. He decided to proceed with CRT locally after talking to surgery team. ?Cycle 1 day 1 of chemotherapy started on 11/24/2021.  So far he has been tolerating it well.  He has noticed some peeling of the skin of the fingertips as well as some hiccups.  Physical examination today noted skin peeling of the fingertips, very mild.  No other concerning symptoms. ?We will proceed with chemotherapy tomorrow, labs reviewed and satisfactory to proceed. ?

## 2021-12-01 NOTE — Assessment & Plan Note (Signed)
Mild at this time, continue as needed Compazine and Zofran as needed as well as dexamethasone as prescribed. ?

## 2021-12-01 NOTE — Assessment & Plan Note (Signed)
He continues to take oxycodone at least once a day.  He does not like to take it more often than that because of drowsiness related to the medication.  We will continue to monitor his pain needs. ?

## 2021-12-01 NOTE — Progress Notes (Signed)
[name] CONSULT NOTE  Patient Care Team: [name], MD as PCP - General (Family Medicine)  CHIEF COMPLAINTS/PURPOSE OF CONSULTATION:  SCC oropharynx  ASSESSMENT & PLAN:  Squamous cell carcinoma of left tonsil [facility]) This is a very pleasant 66 year old male patient, current everyday smoker with newly diagnosed squamous cell carcinoma of the left tonsil, p16 negative, staging T1 versus T2, borderline size, and N2 with bilateral cervical lymphadenopathy which are hypermetabolic on PET imaging and M0 with no evidence of distant metastasis referred to medical oncology for consideration of concurrent chemotherapy and radiation.  Given bilateral cervical lymphadenopathy, we have discussed about considering concurrent chemotherapy and radiation as frontline treatment.  He went to [facility] for a second opinion about role of surgery. He decided to proceed with CRT locally after talking to surgery team. Cycle 1 day 1 of chemotherapy started on 11/24/2021.  So far he has been tolerating it well.  He has noticed some peeling of the skin of the fingertips as well as some hiccups.  Physical examination today noted skin peeling of the fingertips, very mild.  No other concerning symptoms. We will proceed with chemotherapy tomorrow, labs reviewed and satisfactory to proceed.  Cancer related pain He continues to take oxycodone at least once a day.  He does not like to take it more often than that because of drowsiness related to the medication.  We will continue to monitor his pain needs.  Intractable hiccups Hiccups likely related to dexamethasone, will decrease the dose of dexamethasone to 1 tablet instead of 2 tablets.  He can take Thorazine if needed but he currently does not have any ongoing hiccups so we will hold off on this.  Chemotherapy induced nausea and vomiting Mild at this time, continue as needed Compazine and Zofran as needed as well as dexamethasone as prescribed.    No orders  of the defined types were placed in this encounter.                                                                                  HISTORY OF PRESENTING ILLNESS:  [name] [name] 66 y.o. male is here because of SCC oropharynx.  This is a very pleasant 66 year old male patient with past medical history significant for hypertension, dyslipidemia referred to medical oncology with new diagnosis of squamous cell carcinoma of the tonsil.  He was seen by Dr. Benjamine Mola  for chief complaint of sore throat.  Back in July when he had a CT neck, there was no identifiable neck mass or enlarged nodes. He continued to have sore throat and  had a left tonsil biopsy which showed invasive squamous cell carcinoma with ulceration and necrosis, p16 immunostain is negative. PET/CT on 10/18/2018 which showed a hypermetabolic mass centered along the wall of the left posterior lateral oropharynx consistent with known tonsillar neoplasm, hypermetabolic bilateral cervical lymph nodes suspicious for disease involvement.  No evidence of hypermetabolic disease in the chest abdomen or pelvis. He was seen at [facility] for second opinion, recommendation was to proceed with CRT Started concurrent chemoradiation 11/24/2021  Interval History  C1D1 11/24/2021 C2D1 anticipated 12/02/2021  He is tolerating well overall, couple days of hiccups  which resolved after stopping dexamethasone Couple episodes of nausea and vomiting in the morning since he started chemotherapy He continues to need oxycodone once a day for cancer related pain Some peeling of skin of finger tips, not sure if its related to chemotherapy. He is drinking enough water , 40 oz approximately daily, drinking ensure 3 a day apart from water He is eating, but feels full early, no trouble swallowing Using viscous lidocaine for sore throat which has been helping Gained 7 lbs since last visit.  Rest of the pertinent 10 point ROS reviewed and negative.                 MEDICAL  HISTORY:  Past Medical History:  Diagnosis Date   Acute kidney failure ([place]) 04/2018   CKD (chronic kidney disease), stage II    Complication of anesthesia    DDD (degenerative disc disease), lumbar    History of pancreatitis    20-30 years ago   History of venomous snake bite    rt index finger-   Hypercholesterolemia    Hypertension    Nerve pain    Right leg, resolved with stimulator   PONV (postoperative nausea and vomiting)    S/P insertion of spinal cord stimulator 08/2017   Spinal stenosis    Tobacco abuse     SURGICAL HISTORY: Past Surgical History:  Procedure Laterality Date   COLONOSCOPY     FINGER EXPLORATION  05/2011   rt index finger [id number]   HERNIA REPAIR     as a child   I & D EXTREMITY  11/24/2011   Procedure: IRRIGATION AND DEBRIDEMENT EXTREMITY;  Surgeon: Tennis Must, MD;  Location: [facility];  Service: Orthopedics;  Laterality: Right;  right index   IR IMAGING GUIDED PORT INSERTION  11/17/2021   IR US GUIDE VASC ACCESS RIGHT  11/17/2021   MASS EXCISION  01/23/2012   Procedure: EXCISION MASS;  Surgeon: Tennis Must, MD;  Location: [place] [facility];  Service: Orthopedics;  Laterality: Right;  Right index a-cell graft   TOTAL HIP ARTHROPLASTY Right 05/03/2018   Procedure: RIGHT TOTAL HIP ARTHROPLASTY ANTERIOR APPROACH;  Surgeon: Rod Can, MD;  Location: [facility];  Service: Orthopedics;  Laterality: Right;  Needs RNFA    SOCIAL HISTORY: Social History   Socioeconomic History   Marital status: Married    Spouse name: Not on file   Number of children: Not on file   Years of education: Not on file   Highest education level: Not on file  Occupational History   Not on file  Tobacco Use   Smoking status: Every Day    Packs/day: 0.50    Years: 20.00    Pack years: 10.00    Types: Cigarettes   Smokeless tobacco: Never  Vaping Use   Vaping Use: Never used  Substance and Sexual Activity   Alcohol use: Yes    Comment:  occasional   Drug use: No   Sexual activity: Not Currently  Other Topics Concern   Not on file  Social History Narrative   Not on file   Social Determinants of Health   Financial Resource Strain: Low Risk    Difficulty of Paying Living Expenses: Not hard at all  Food Insecurity: No Food Insecurity   Worried About Charity fundraiser in the Last Year: Never true   Ran Out of Food in the Last Year: Never true  Transportation Needs: No Transportation Needs   Lack  of Transportation (Medical): No   Lack of Transportation (Non-Medical): No  Physical Activity: Not on file  Stress: Not on file  Social Connections: Not on file  Intimate Partner Violence: Not on file    FAMILY HISTORY: Family History  Problem Relation Age of Onset   Diabetes Mother    Throat cancer Brother     ALLERGIES:  is allergic to codeine.  MEDICATIONS:  Current Outpatient Medications  Medication Sig Dispense Refill   dexamethasone (DECADRON) 4 MG tablet Take 2 tablets (8 mg total) by mouth daily. Take daily x 3 days starting the day after cisplatin chemotherapy. Take with food. 30 tablet 1   lidocaine (XYLOCAINE) 2 % solution Patient: Mix 1part 2% viscous lidocaine, 1part H20. Swish & swallow 8mL of diluted mixture, 19min before meals and at bedtime, up to QID 200 mL 3   lidocaine-prilocaine (EMLA) cream Apply to affected area once 30 g 3   LORazepam (ATIVAN) 0.5 MG tablet Take 1 tablet (0.5 mg total) by mouth every 6 (six) hours as needed (Nausea or vomiting). 30 tablet 0   Multiple Vitamins-Minerals (CENTRUM ULTRA MENS) TABS Take 1 tablet by mouth daily.       olmesartan-hydrochlorothiazide (BENICAR HCT) 40-25 MG per tablet Take 1 tablet by mouth daily.       ondansetron (ZOFRAN) 8 MG tablet Take 1 tablet (8 mg total) by mouth 2 (two) times daily as needed. Start on the third day after cisplatin chemotherapy. 30 tablet 1   oxyCODONE (OXY IR/ROXICODONE) 5 MG immediate release tablet Take 1 tablet (5 mg  total) by mouth every 4 (four) hours as needed for severe pain. 30 tablet 0   prochlorperazine (COMPAZINE) 10 MG tablet Take 1 tablet (10 mg total) by mouth every 6 (six) hours as needed (Nausea or vomiting). 30 tablet 1   rosuvastatin (CRESTOR) 10 MG tablet Take 1 tablet (10 mg total) by mouth daily. 90 tablet 0   No current facility-administered medications for this visit.     PHYSICAL EXAMINATION: ECOG PERFORMANCE STATUS: 0 - Asymptomatic  Vitals:   12/01/21 1000  BP: 104/60  Pulse: 78  Resp: 16  Temp: 97.7 F (36.5 C)  SpO2: 100%    Filed Weights   12/01/21 1000  Weight: 164 lb 11.2 oz (74.7 kg)    Physical Exam Constitutional:      Appearance: Normal appearance.  HENT:     Head: Normocephalic and atraumatic.  Cardiovascular:     Rate and Rhythm: Normal rate and regular rhythm.     Pulses: Normal pulses.     Heart sounds: Normal heart sounds.  Pulmonary:     Effort: Pulmonary effort is normal.     Breath sounds: Normal breath sounds.  Abdominal:     General: Abdomen is flat. Bowel sounds are normal.     Palpations: Abdomen is soft.  Musculoskeletal:     Cervical back: Normal range of motion and neck supple. No rigidity.  Lymphadenopathy:     Cervical: No cervical adenopathy.  Skin:    General: Skin is warm and dry.  Neurological:     General: No focal deficit present.     Mental Status: He is alert.     LABORATORY DATA:  I have reviewed the data as listed Lab Results  Component Value Date   WBC 7.1 12/01/2021   HGB 13.4 12/01/2021   HCT 38.7 (L) 12/01/2021   MCV 97.2 12/01/2021   PLT 170 12/01/2021     Chemistry  Component Value Date/Time   NA 134 (L) 12/01/2021 0910   K 4.5 12/01/2021 0910   CL 100 12/01/2021 0910   CO2 29 12/01/2021 0910   BUN 17 12/01/2021 0910   CREATININE 0.78 12/01/2021 0910   CREATININE 0.61 10/25/2021 1053      Component Value Date/Time   CALCIUM 9.4 12/01/2021 0910   ALKPHOS 83 12/01/2021 0910   AST 44 (H)  12/01/2021 0910   AST 72 (H) 10/25/2021 1053   ALT 95 (H) 12/01/2021 0910   ALT 48 (H) 10/25/2021 1053   BILITOT 0.5 12/01/2021 0910   BILITOT 1.0 10/25/2021 1053       RADIOGRAPHIC STUDIES: I have personally reviewed the radiological images as listed and agreed with the findings in the report. IR US Guide Vasc Access Right  Result Date: 11/22/2021 INDICATION: Head and neck malignancy EXAM: IMPLANTED PORT A CATH PLACEMENT WITH ULTRASOUND AND FLUOROSCOPIC GUIDANCE MEDICATIONS: None ANESTHESIA/SEDATION: Local anesthetic and single agent sedation was employed during this procedure. A total of Versed 1 mg was administered intravenously. The patient's level of consciousness and vital signs were monitored continuously by radiology nursing throughout the procedure under my direct supervision. FLUOROSCOPY TIME:  0.1 minutes (1 mGy) COMPLICATIONS: None immediate. PROCEDURE: The procedure, risks, benefits, and alternatives were explained to the patient. Questions regarding the procedure were encouraged and answered. The patient understands and consents to the procedure. The neck and chest were prepped with chlorhexidine in a sterile fashion, and a sterile drape was applied covering the operative field. Maximum barrier sterile technique with sterile gowns and gloves were used for the procedure. A timeout was performed prior to the initiation of the procedure. Local anesthesia was provided with 1% lidocaine with epinephrine. After creating a small venotomy incision, a micropuncture kit was utilized to access the internal jugular vein under direct, real-time ultrasound guidance. Ultrasound image documentation was performed. The microwire was kinked to measure appropriate catheter length. A subcutaneous port pocket was then created along the upper chest wall utilizing a combination of sharp and blunt dissection. The pocket was irrigated with sterile saline. A single lumen ISP power injectable port was chosen for  placement. The 8 Fr catheter was tunneled from the port pocket site to the venotomy incision. The port was placed in the pocket. The external catheter was trimmed to appropriate length. At the venotomy, an 8 Fr peel-away sheath was placed over a guidewire under fluoroscopic guidance. The catheter was then placed through the sheath and the sheath was removed. Final catheter positioning was confirmed and documented with a fluoroscopic spot radiograph. The port was accessed with a Huber needle, aspirated and flushed with heparinized saline. The port pocket incision was closed with interrupted 3-0 Vicryl suture then Dermabond was applied, including at the venotomy incision. Dressings were placed. The patient tolerated the procedure well without immediate post procedural complication. IMPRESSION: Successful placement of a RIGHT internal jugular approach power injectable Port-A-Cath. The tip of the catheter is positioned within the proximal RIGHT atrium. The catheter is ready for immediate use. Michaelle Birks, MD [facility] Radiology Electronically Signed   By: Michaelle Birks M.D.   On: 11/22/2021 11:04   IR IMAGING GUIDED PORT INSERTION  Result Date: 11/17/2021 INDICATION: Head and neck malignancy EXAM: IMPLANTED PORT A CATH PLACEMENT WITH ULTRASOUND AND FLUOROSCOPIC GUIDANCE MEDICATIONS: None ANESTHESIA/SEDATION: Local anesthetic and single agent sedation was employed during this procedure. A total of Versed 1 mg was administered intravenously. The patient's level of consciousness  and vital signs were monitored continuously by radiology nursing throughout the procedure under my direct supervision. FLUOROSCOPY TIME:  0.1 minutes (1 mGy) COMPLICATIONS: None immediate. PROCEDURE: The procedure, risks, benefits, and alternatives were explained to the patient. Questions regarding the procedure were encouraged and answered. The patient understands and consents to the procedure.  The neck and chest were prepped with chlorhexidine in a sterile fashion, and a sterile drape was applied covering the operative field. Maximum barrier sterile technique with sterile gowns and gloves were used for the procedure. A timeout was performed prior to the initiation of the procedure. Local anesthesia was provided with 1% lidocaine with epinephrine. After creating a small venotomy incision, a micropuncture kit was utilized to access the internal jugular vein under direct, real-time ultrasound guidance. Ultrasound image documentation was performed. The microwire was kinked to measure appropriate catheter length. A subcutaneous port pocket was then created along the upper chest wall utilizing a combination of sharp and blunt dissection. The pocket was irrigated with sterile saline. A single lumen ISP power injectable port was chosen for placement. The 8 Fr catheter was tunneled from the port pocket site to the venotomy incision. The port was placed in the pocket. The external catheter was trimmed to appropriate length. At the venotomy, an 8 Fr peel-away sheath was placed over a guidewire under fluoroscopic guidance. The catheter was then placed through the sheath and the sheath was removed. Final catheter positioning was confirmed and documented with a fluoroscopic spot radiograph. The port was accessed with a Huber needle, aspirated and flushed with heparinized saline. The port pocket incision was closed with interrupted 3-0 Vicryl suture then Dermabond was applied, including at the venotomy incision. Dressings were placed. The patient tolerated the procedure well without immediate post procedural complication. IMPRESSION: Successful placement of a RIGHT internal jugular approach power injectable Port-A-Cath. The tip of the catheter is positioned within the proximal RIGHT atrium. The catheter is ready for immediate use. Michaelle Birks, MD [facility] Radiology  Electronically Signed   By: Michaelle Birks M.D.   On: 11/17/2021 16:52    All questions were answered. The patient knows to call the clinic with any problems, questions or concerns. I spent 30 minutes in the care of this patient including H and P, review of records, counseling and coordination of care.     Benay Pike, MD 12/01/2021 10:28 AM

## 2021-12-02 ENCOUNTER — Ambulatory Visit: Payer: No Typology Code available for payment source | Admitting: Physical Therapy

## 2021-12-02 ENCOUNTER — Ambulatory Visit: Payer: No Typology Code available for payment source

## 2021-12-02 ENCOUNTER — Encounter: Payer: Self-pay | Admitting: Physical Therapy

## 2021-12-02 ENCOUNTER — Ambulatory Visit
Admission: RE | Admit: 2021-12-02 | Discharge: 2021-12-02 | Disposition: A | Discharge status: Home / Self Care | Payer: No Typology Code available for payment source | Source: Ambulatory Visit | Attending: Radiation Oncology | Admitting: Radiation Oncology

## 2021-12-02 ENCOUNTER — Inpatient Hospital Stay: Payer: No Typology Code available for payment source

## 2021-12-02 ENCOUNTER — Ambulatory Visit: Payer: No Typology Code available for payment source | Attending: Radiation Oncology

## 2021-12-02 ENCOUNTER — Inpatient Hospital Stay: Payer: No Typology Code available for payment source | Admitting: Nutrition

## 2021-12-02 VITALS — BP 111/74 | HR 65 | Temp 97.9°F | Resp 16

## 2021-12-02 DIAGNOSIS — Z5111 Encounter for antineoplastic chemotherapy: Secondary | ICD-10-CM | POA: Diagnosis not present

## 2021-12-02 DIAGNOSIS — R293 Abnormal posture: Secondary | ICD-10-CM | POA: Insufficient documentation

## 2021-12-02 DIAGNOSIS — C099 Malignant neoplasm of tonsil, unspecified: Secondary | ICD-10-CM | POA: Insufficient documentation

## 2021-12-02 DIAGNOSIS — R1310 Dysphagia, unspecified: Secondary | ICD-10-CM | POA: Insufficient documentation

## 2021-12-02 MED ORDER — PALONOSETRON HCL INJECTION 0.25 MG/5ML
0.2500 mg | Freq: Once | INTRAVENOUS | Status: AC
  Administered 2021-12-02: 0.25 mg via INTRAVENOUS
  Filled 2021-12-02: qty 5

## 2021-12-02 MED ORDER — SODIUM CHLORIDE 0.9 % IV SOLN
150.0000 mg | Freq: Once | INTRAVENOUS | Status: AC
  Administered 2021-12-02: 150 mg via INTRAVENOUS
  Filled 2021-12-02: qty 150

## 2021-12-02 MED ORDER — SODIUM CHLORIDE 0.9 % IV SOLN
10.0000 mg | Freq: Once | INTRAVENOUS | Status: AC
  Administered 2021-12-02: 10 mg via INTRAVENOUS
  Filled 2021-12-02: qty 10

## 2021-12-02 MED ORDER — SODIUM CHLORIDE 0.9 % IV SOLN: Freq: Once | INTRAVENOUS | Status: AC

## 2021-12-02 MED ORDER — SODIUM CHLORIDE 0.9% FLUSH
10.0000 mL | INTRAVENOUS | Status: DC | PRN
  Administered 2021-12-02: 10 mL

## 2021-12-02 MED ORDER — SODIUM CHLORIDE 0.9 % IV SOLN
40.0000 mg/m2 | Freq: Once | INTRAVENOUS | Status: AC
  Administered 2021-12-02: 74 mg via INTRAVENOUS
  Filled 2021-12-02: qty 74

## 2021-12-02 MED ORDER — HEPARIN SOD (PORK) LOCK FLUSH 100 UNIT/ML IV SOLN
500.0000 [IU] | Freq: Once | INTRAVENOUS | Status: AC | PRN
  Administered 2021-12-02: 500 [IU]

## 2021-12-02 MED ORDER — POTASSIUM CHLORIDE IN NACL 20-0.9 MEQ/L-% IV SOLN
Freq: Once | INTRAVENOUS | Status: AC
  Filled 2021-12-02: qty 1000

## 2021-12-02 MED ORDER — MAGNESIUM SULFATE 2 GM/50ML IV SOLN
2.0000 g | Freq: Once | INTRAVENOUS | Status: AC
  Administered 2021-12-02: 2 g via INTRAVENOUS
  Filled 2021-12-02: qty 50

## 2021-12-02 NOTE — Progress Notes (Signed)
Nutrition follow-up completed with patient during infusion for left tonsil cancer.  Patient is receiving concurrent chemoradiation with weekly cisplatin.  He is followed by Dr. Chryl Heck and Dr. Isidore Moos.  He has declined PEG placement. ? ?Weight improved and documented as 164.7 pounds on March 1, increased from 157 pounds February 22.  He is within a usual body weight for him. ? ?Labs noted: Sodium 134, glucose 247, magnesium 1.7. ? ?Patient reports he tolerated his first cycle of chemotherapy well.  States there were 2 mornings he woke up and vomited but he never had nausea.  He denies constipation or diarrhea.  Reports he is now on narcotic pain medication and understands this can result in constipation.  He denies difficulty swallowing.  He is drinking Ensure supplements 3 times daily with a milkshake most days of the week. ? ?Nutrition diagnosis: Inadequate oral intake is stable. ? ?Intervention: ?Recommended patient continue Ensure nutrition supplements 3 times daily with milkshakes as needed. ?Continue adequate calories and protein for weight maintenance. ?Encouraged bowel regimen. ?Encourage patient to monitor nausea and take medications as prescribed. ? ?Monitoring, evaluation, goals: ?Patient will tolerate adequate calories and protein to minimize weight loss. ? ?Next visit: ?Thursday, March 9 during infusion. ? ?**Disclaimer: This note was dictated with voice recognition software. Similar sounding words can inadvertently be transcribed and this note may contain transcription errors which may not have been corrected upon publication of note.** ? ?

## 2021-12-02 NOTE — Progress Notes (Signed)
Oncology Nurse Navigator Documentation  ? ?I met with patient briefly before his appointments with PT and SLP today. He voices no concerns at this time but knows to call me if he has any needs or questions.  ? ?Harlow Asa RN, BSN, OCN ?Head & Neck Oncology Nurse Navigator ?[place] at [facility] ?Phone # [phone]  ?Fax # [phone]  ?

## 2021-12-02 NOTE — Patient Instructions (Signed)
[facility] MEDICAL ONCOLOGY  Discharge Instructions: Thank you for choosing [facility] to provide your oncology and hematology care.   If you have a lab appointment with the Cancer Center, please go directly to the Cancer Center and check in at the registration area.   Wear comfortable clothing and clothing appropriate for easy access to any Portacath or PICC line.   We strive to give you quality time with your provider. You may need to reschedule your appointment if you arrive late (15 or more minutes).  Arriving late affects you and other patients whose appointments are after yours.  Also, if you miss three or more appointments without notifying the office, you may be dismissed from the clinic at the provider's discretion.      For prescription refill requests, have your pharmacy contact our office and allow 72 hours for refills to be completed.    Today you received the following chemotherapy and/or immunotherapy agents : Cisplatin    To help prevent nausea and vomiting after your treatment, we encourage you to take your nausea medication as directed.  BELOW ARE SYMPTOMS THAT SHOULD BE REPORTED IMMEDIATELY: *FEVER GREATER THAN 100.4 F (38 C) OR HIGHER *CHILLS OR SWEATING *NAUSEA AND VOMITING THAT IS NOT CONTROLLED WITH YOUR NAUSEA MEDICATION *UNUSUAL SHORTNESS OF BREATH *UNUSUAL BRUISING OR BLEEDING *URINARY PROBLEMS (pain or burning when urinating, or frequent urination) *BOWEL PROBLEMS (unusual diarrhea, constipation, pain near the anus) TENDERNESS IN MOUTH AND THROAT WITH OR WITHOUT PRESENCE OF ULCERS (sore throat, sores in mouth, or a toothache) UNUSUAL RASH, SWELLING OR PAIN  UNUSUAL VAGINAL DISCHARGE OR ITCHING   Items with * indicate a potential emergency and should be followed up as soon as possible or go to the Emergency Department if any problems should occur.  Please show the CHEMOTHERAPY ALERT CARD or IMMUNOTHERAPY ALERT CARD at check-in to  the Emergency Department and triage nurse.  Should you have questions after your visit or need to cancel or reschedule your appointment, please contact [facility] MEDICAL ONCOLOGY  Dept: [phone]  and follow the prompts.  Office hours are 8:00 a.m. to 4:30 p.m. Monday - Friday. Please note that voicemails left after 4:00 p.m. may not be returned until the following business day.  We are closed weekends and major holidays. You have access to a nurse at all times for urgent questions. Please call the main number to the clinic Dept: [phone] and follow the prompts.   For any non-urgent questions, you may also contact your provider using MyChart. We now offer e-Visits for anyone 18 and older to request care online for non-urgent symptoms. For details visit [email or web address].   Also download the MyChart app! Go to the app store, search "MyChart", open the app, select [facility], and log in with your MyChart username and password.  Due to Covid, a mask is required upon entering the hospital/clinic. If you do not have a mask, one will be given to you upon arrival. For doctor visits, patients may have 1 support person aged 18 or older with them. For treatment visits, patients cannot have anyone with them due to current Covid guidelines and our immunocompromised population.   

## 2021-12-02 NOTE — Therapy (Signed)
[facility] [address], [place], [place], [address] Phone: [phone]   Fax:  [phone]  Speech Language Pathology Evaluation  Patient Details  Name: [name] [name] MRN: [id number] Date of Birth: [birth year] Referring Provider (SLP): Eppie Gibson, MD   Encounter Date: 12/02/2021   End of Session - 12/02/21 1527     Visit Number 1    Number of Visits 3    Date for SLP Re-Evaluation 03/02/22    SLP Start Time 0900    SLP Stop Time  0930    SLP Time Calculation (min) 30 min    Activity Tolerance Patient tolerated treatment well             Past Medical History:  Diagnosis Date   Acute kidney failure ([place]) 04/2018   CKD (chronic kidney disease), stage II    Complication of anesthesia    DDD (degenerative disc disease), lumbar    History of pancreatitis    20-30 years ago   History of venomous snake bite    rt index finger-   Hypercholesterolemia    Hypertension    Nerve pain    Right leg, resolved with stimulator   PONV (postoperative nausea and vomiting)    S/P insertion of spinal cord stimulator 08/2017   Spinal stenosis    Tobacco abuse     Past Surgical History:  Procedure Laterality Date   COLONOSCOPY     FINGER EXPLORATION  05/2011   rt index finger [id number]   HERNIA REPAIR     as a child   I & D EXTREMITY  11/24/2011   Procedure: IRRIGATION AND DEBRIDEMENT EXTREMITY;  Surgeon: Tennis Must, MD;  Location: [facility];  Service: Orthopedics;  Laterality: Right;  right index   IR IMAGING GUIDED PORT INSERTION  11/17/2021   IR US GUIDE VASC ACCESS RIGHT  11/17/2021   MASS EXCISION  01/23/2012   Procedure: EXCISION MASS;  Surgeon: Tennis Must, MD;  Location: [facility];  Service: Orthopedics;  Laterality: Right;  Right index a-cell graft   TOTAL HIP ARTHROPLASTY Right 05/03/2018   Procedure: RIGHT TOTAL HIP ARTHROPLASTY ANTERIOR APPROACH;  Surgeon: Rod Can, MD;   Location: [facility];  Service: Orthopedics;  Laterality: Right;  Needs RNFA    There were no vitals filed for this visit.   Subjective Assessment - 12/02/21 1058     Subjective Josephine denies oral and pharyngea dysphagia s/sx.    Currently in Pain? No/denies                SLP Evaluation [facility] - 12/02/21 1058       SLP Visit Information   SLP Received On 12/02/21    Referring Provider (SLP) Eppie Gibson, MD    Onset Date May 2022    Medical Diagnosis Lt tonsilar CA      Subjective   Patient/Family Stated Goal maintain WNL swallow function      General Information   HPI Pt with squamous cell carcinoma of left tonsil, stage IVA (T2, N2c M0, p16-. He presented to Dr. Benjamine Mola May 2022 with left throat pain. A slightly prominent lingual tonsil was noted at that time. 04/07/21 CT completed which did not show any masses or enlarged lymph nodes. 09/30/21 He returned to Dr. Benjamine Mola with complaints of worsening left sided sore throat as well as new onset of dysphagia and odynophagia. Physical exam revealed 2 cm ulcerative mass on the left  tonsil. Fiberoptic Laryngoscopy and biopsy completed as well that day. Biopsy of Left tonsil revealed invasive SCC with ulceration and necrosis, p16 negative. 10/18/21 PET revealed a hypermetabolic mass centered along the wall of the left posterolateral oropharynx; consistent with known tonsillar neoplasm. Hypermetabolic bilateral cervical lymph nodes were also appreciated (including RP node), and were noted as suspicious for disease involvement. No evidence of hypermetabolic metastatic disease in the chest, abdomen, or pelvis. Consult with Drs. Squire and Iruku on 10/25/21, chemo/radiation was recommended. 11/17/21 PAC placement and he declined PEG against medical advice. Patient sought surgical consult at [facility] on 11/18/21 who confirmed that chemo/radiation was preferrable over surgery due to significant functional deficit after surgery. He will receive 35 fractions of  radiation to his oropharynx and bilateral neck with weekly cisplatin.  He started on 11/24/20 and will complete on 01/11/22.       Prior Functional Status   Cognitive/Linguistic Baseline Within functional limits      Cognition   Overall Cognitive Status Within Functional Limits for tasks assessed      Auditory Comprehension   Overall Auditory Comprehension Appears within functional limits for tasks assessed      Oral Motor/Sensory Function   Overall Oral Motor/Sensory Function Appears within functional limits for tasks assessed      Motor Speech   Overall Motor Speech Appears within functional limits for tasks assessed            Pt currently tolerates regular diet and thin liquids and denied overt s/sx of oral or pharyngeal dysphagia. POs: Pt ate potato chips and drank ginger ale without overt s/s oral dysphagia, or pharyngeal dysphagia. Thyroid elevation appeared adequate, and swallows appeared timely. Pt's swallow deemed WNL at this time.   Because data states the risk for dysphagia during and after radiation treatment is high due to undergoing radiation tx, SLP taught pt about the possibility of reduced/limited ability for PO intake during rad tx. SLP encouraged pt to continue swallowing POs as far into rad tx as possible, even ingesting POs and/or completing HEP shortly after administration of pain meds. Among other modifications for days when pt cannot functionally swallow, SLP talked about performing only non-swallowing tasks on the handout/HEP, and if necessary to cycle through the swallowing portion so the program of exercises can be completed instead of fatiguing on one of the swallowing exercises and not being able to perform the other swallowing exercises.   SLP educated pt re: changes to swallowing musculature after rad tx, and why adherence to dysphagia HEP provided today and PO consumption was necessary to inhibit muscular disuse atrophy and to reduce muscle fibrosis following  rad tx. Pt demonstrated understanding of these things to SLP.    SLP then developed a HEP for pt and pt was instructed how to perform exercises involving lingual and pharyngeal strengthening. SLP performed each exercise and pt return demonstrated each exercise. SLP ensured pt performance was correct prior to moving on to next exercise. Pt was instructed to complete this program 2 times a day, 7 days/week until 6 months after his last rad tx, then x2 a week after that.                  SLP Education - 12/02/21 1526     Education Details late effects head/neck radiation on swallowing, HEP procedure    Person(s) Educated Patient    Methods Explanation;Demonstration;Verbal cues;Handout    Comprehension Verbalized understanding;Returned demonstration;Verbal cues required;Need further instruction  SLP Short Term Goals - 12/02/21         SLP SHORT TERM GOAL #1      Title Time pt will complete HEP with rare min A   1    Period --   sessions, for all STGs    Status New           SLP SHORT TERM GOAL #2    Title   Time Period pt will tell SLP why pt is completing HEP with modified independence x2 visits 2       Status New           SLP SHORT TERM GOAL #3    Title   Time Period pt will describe 3 overt s/s aspiration PNA with modified independence  2    Status New           SLP SHORT TERM GOAL #4    Title   Time Period pt will tell SLP how a food journal could hasten return to a more normalized diet  2    Status New                    SLP Long Term Goals - 12/02/21                      SLP LONG TERM GOAL #1    Title pt will complete HEP with modified independence over three visits     Time 4    Period --   sessions, for all LTGs    Status New           SLP LONG TERM GOAL #2    Title pt will describe how to modify HEP over time, and the timeline associated with reduction in HEP frequency with modified independence over two sessions      Time Period 6      Status New                    Plan - 12/02/21      Clinical Impression Statement As above pt swallowing is deemed WNL with items assessed today - regular diet and thin liquid. SLP developed an individualized HEP for dysphagia and pt completed each exercise on his own initially with rare min assist from SLP faded to pt independence. There are no overt s/s aspiration with POs reported by pt at this time, nor overt s/sx aspiration PNA; Neither oral nor pharyngeal difficulties were detected today when SLP observed pt with POs. Data indicate that pt's swallow ability will likely decrease over the course of radiation therapy and could very well decline over time following conclusion of their radiation therapy due to muscle fibrosis and/or atrophy. Pt will cont to need to be seen by SLP in order to assess safety of PO intake, assess the need for recommending any objective swallow assessment, and ensuring pt correctly completes the individualized HEP.     Speech Therapy Frequency once approx every 4 weeks    Duration 7 total sessions; 3 for this reporting period    Treatment/Interventions Aspiration precaution training;Laryngeal and Pharyngeal strengthening exercises;Diet toleration management by SLP;Trials of upgraded texture/liquids;Internal/external aids;Patient/family education;SLP instruction and feedback;Compensatory strategies;Cueing hierarchy     Potential to Achieve Goals Good     SLP Home Exercise Plan provided           Patient will benefit from skilled therapeutic intervention in order to improve the following deficits and impairments:  Dysphagia, unspecified type    Problem List Patient Active Problem List   Diagnosis Date Noted   Intractable hiccups 12/01/2021   Chemotherapy induced nausea and vomiting 12/01/2021   Cancer related pain 11/16/2021   Encounter for preoperative dental examination 10/29/2021   Caries 10/29/2021   Teeth missing 10/29/2021    Chronic periodontitis 10/29/2021   Accretions on teeth 10/29/2021   Torus mandibularis 10/29/2021   Excessive dental attrition 10/29/2021   Gingival recession, generalized 10/29/2021   Phobia of dental procedure 10/29/2021   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 ([place]) 10/26/2021   Squamous cell carcinoma of left tonsil ([place]) 10/25/2021   Avascular necrosis of hip, right ([place]) 05/03/2018   Tobacco abuse 12/12/2011   Cellulitis of second finger of right hand 05/18/2011   Cellulitis and abscess of finger 05/18/2011   Cellulitis of hand 05/18/2011   Cellulitis of index finger 05/18/2011   Snake bite poisoning 05/17/2011   Swelling of second finger of right hand 05/17/2011   Pain in finger of right hand 05/17/2011   HTN (hypertension) 05/17/2011   Hyperlipidemia 05/17/2011   FATTY LIVER DISEASE 12/04/2008   ABDOMINAL PAIN-RUQ 12/04/2008    [place], [place] 12/02/2021, 3:29 PM  [facility] [address] [place], [place], [address] Phone: [phone]   Fax:  [phone]  Name: [name] [name] MRN: [id number] Date of Birth: [birth year]

## 2021-12-02 NOTE — Patient Instructions (Signed)
SWALLOWING EXERCISES ? ?HARD Swallows- strong    ?- Take sips if you need to ?- 10-15 times, 2 times a day, and use whenever you eat or drink ? ?Swallow with your tongue sticking out - use a drop of water ?- 10-15 times, 2 times a day ? ??EEEEEEEEEEEEEE, HE - HE - HE  - HE? ?- 20 times, 2 times a day ? ?Lie down, raise your head, look at your feet for 45-60 seconds ?- 3 times, 2 times a day ? ?Super Swallow ?- take a breath-hold it, swallow and cough ?- 10 times, 2 times a day ? ?

## 2021-12-02 NOTE — Therapy (Signed)
OUTPATIENT PHYSICAL THERAPY HEAD AND NECK BASELINE EVALUATION   Patient Name: [name] [name] MRN: [id number] DOB:September 23, 1956, 66 y.o., male Today's Date: 12/02/2021   PT End of Session - 12/02/21 0847     Visit Number 1    Number of Visits 2    Date for PT Re-Evaluation 01/27/22    PT Start Time 0819    PT Stop Time 0841    PT Time Calculation (min) 22 min    Activity Tolerance Patient tolerated treatment well    Behavior During Therapy [facility] for tasks assessed/performed             Past Medical History:  Diagnosis Date   Acute kidney failure ([place]) 04/2018   CKD (chronic kidney disease), stage II    Complication of anesthesia    DDD (degenerative disc disease), lumbar    History of pancreatitis    20-30 years ago   History of venomous snake bite    rt index finger-   Hypercholesterolemia    Hypertension    Nerve pain    Right leg, resolved with stimulator   PONV (postoperative nausea and vomiting)    S/P insertion of spinal cord stimulator 08/2017   Spinal stenosis    Tobacco abuse    Past Surgical History:  Procedure Laterality Date   COLONOSCOPY     FINGER EXPLORATION  05/2011   rt index finger [id number]   HERNIA REPAIR     as a child   I & D EXTREMITY  11/24/2011   Procedure: IRRIGATION AND DEBRIDEMENT EXTREMITY;  Surgeon: Tennis Must, MD;  Location: [place];  Service: Orthopedics;  Laterality: Right;  right index   IR IMAGING GUIDED PORT INSERTION  11/17/2021   IR US GUIDE VASC ACCESS RIGHT  11/17/2021   MASS EXCISION  01/23/2012   Procedure: EXCISION MASS;  Surgeon: Tennis Must, MD;  Location: [facility];  Service: Orthopedics;  Laterality: Right;  Right index a-cell graft   TOTAL HIP ARTHROPLASTY Right 05/03/2018   Procedure: RIGHT TOTAL HIP ARTHROPLASTY ANTERIOR APPROACH;  Surgeon: Rod Can, MD;  Location: [facility];  Service: Orthopedics;  Laterality: Right;  Needs RNFA   Patient Active Problem List   Diagnosis Date  Noted   Intractable hiccups 12/01/2021   Chemotherapy induced nausea and vomiting 12/01/2021   Cancer related pain 11/16/2021   Encounter for preoperative dental examination 10/29/2021   Caries 10/29/2021   Teeth missing 10/29/2021   Chronic periodontitis 10/29/2021   Accretions on teeth 10/29/2021   Torus mandibularis 10/29/2021   Excessive dental attrition 10/29/2021   Gingival recession, generalized 10/29/2021   Phobia of dental procedure 10/29/2021   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 ([place]) 10/26/2021   Squamous cell carcinoma of left tonsil ([place]) 10/25/2021   Avascular necrosis of hip, right ([place]) 05/03/2018   Tobacco abuse 12/12/2011   Cellulitis of second finger of right hand 05/18/2011   Cellulitis and abscess of finger 05/18/2011   Cellulitis of hand 05/18/2011   Cellulitis of index finger 05/18/2011   Snake bite poisoning 05/17/2011   Swelling of second finger of right hand 05/17/2011   Pain in finger of right hand 05/17/2011   HTN (hypertension) 05/17/2011   Hyperlipidemia 05/17/2011   FATTY LIVER DISEASE 12/04/2008   ABDOMINAL PAIN-RUQ 12/04/2008    PCP: [name], MD  REFERRING PROVIDER: Eppie Gibson, MD  REFERRING DIAG: C09.9 (ICD-10-CM) - Squamous cell carcinoma of left tonsil ([place])   THERAPY DIAG:  Abnormal posture  Malignant neoplasm of tonsil ([place])  ONSET DATE: 09/30/21  SUBJECTIVE    SUBJECTIVE STATEMENT: Patient reports they are here today to be seen by their medical team for newly diagnosed left tonsillar cancer cancer.   PERTINENT HISTORY:   Squamous cell carcinoma of left tonsil, stage IVA (T2, N2c M0, p16 -), 04/07/21 CT completed which did not show any masses or enlarged lymph nodes. 09/30/21 He returned to Dr. Benjamine Mola with complaints of worsening left sided sore throat as well as new onset of dysphagia and odynophagia. Physical exam revealed 2 cm ulcerative mass on the left tonsil. Fiberoptic Laryngoscopy completed as well that  day.  09/30/21 Biopsy of Left tonsil revealed invasive SCC with ulceration and necrosis, p16 negative.10/18/21 PET revealed a hypermetabolic mass centered along the wall of the left posterolateral oropharynx; consistent with known tonsillar neoplasm. Hypermetabolic bilateral cervical lymph nodes were also appreciated (including RP node), and were noted as suspicious for disease involvement. No evidence of hypermetabolic metastatic disease in the chest, abdomen, or pelvis were otherwise appreciated. Will receive 35 fractions of radiation to his Oropharynx and bilateral neck with weekly cisplatin.  He started on 11/24/21 and will complete on 01/11/22. 11/17/21 PAC placement. He declined PEG against medical advice.  ` PATIENT GOALS   to be educated about the signs and symptoms of lymphedema and learn post op HEP.   PAIN:  Are you having pain? Yes NPRS scale: 8/10 Pain location: Throat Pain orientation: Left  PAIN TYPE: sharp Pain description: constant  Aggravating factors: swallowing bread or something rough Relieving factors: cool liquids, lidocaine  PRECAUTIONS: Active CA and Joint replacement R THA 2 years ago, nerve stimulator for sciatic nerve in back  WEIGHT BEARING RESTRICTIONS No  FALLS:  Has patient fallen in last 6 months? No Does the patient have a fear of falling that limits activity? 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  OCCUPATION: retired  LEISURE: pt reports he walks, with sciatic nerve issues he can stand 5 min then has to sit and is only able to walk for 15 min due to 3 deteriorated disc  PRIOR LEVEL OF FUNCTION: Independent   OBJECTIVE  COGNITION:           Overall cognitive status: Within functional limits  for tasks assessed                  POSTURE:  Forward head and rounded shoulders posture   30 SEC SIT TO STAND: Non performed today due to pt receiving chemotherapy at time of eval   SHOULDER AROM:   WFL    CERVICAL AROM:   Percent limited  Flexion WFL  Extension WFL  Right lateral flexion WFL  Left lateral flexion WFL  Right rotation WFL  Left rotation WFL    (Blank rows=not tested)   GAIT: Assessed: No Not assessed due to pt receiving chemo at time of eval  PATIENT EDUCATION:  Education details: Neck ROM, importance of posture when sitting, standing and lying down, deep breathing, walking program and importance of staying active throughout treatment, CURE article on staying active, "Why exercise?" flyer, lymphedema and PT info Person educated: Patient Education method: Explanation, Demonstration, Handout Education comprehension: Patient verbalized understanding  and returned demonstration   HOME EXERCISE PROGRAM: Patient was instructed today in a home exercise program today for head and neck range of motion exercises. These included active cervical flexion, active cervical extension, active cervical rotation to each direction, upper trap stretch, and shoulder retraction. Patient was encouraged to do these 2-3 times a day, holding for 5 sec each and completing for 5 reps. Pt was educated that once this becomes easier then hold the stretches for 30-60 seconds.    ASSESSMENT:  CLINICAL IMPRESSION: Pt arrives to PT with recently diagnosed left tonsillar cancer. Pt will undergo chemotherapy and radiation. Pt will start treatment on 11/24/21 and complete on 01/11/22.  Pt's cervical ROM was [facility]. Pt was seen while receiving chemo infusion today so therapist was unable to assess gait or 30 second sit to stand. Educated pt about signs and symptoms of lymphedema as well as anatomy and physiology of lymphatic system. Educated pt in importance of staying as active as possible throughout  treatment to decrease fatigue as well as head and neck ROM exercises to decrease loss of ROM. Will see pt after completion of radiation to reassess ROM and assess for lymphedema and to determine therapy needs at that time.  Pt will benefit from skilled therapeutic intervention to improve on the following deficits: Decreased knowledge of precautions and postural dysfunction.   PT treatment/interventions: ADL/self-care home management, pt/family education, therapeutic exercise. Other interventions Therapeutic exercises and Patient/Family education  REHAB POTENTIAL: Good  CLINICAL DECISION MAKING: Stable/uncomplicated  EVALUATION COMPLEXITY: Low   GOALS: Goals reviewed with patient? YES  LONG TERM GOALS: (STG=LTG)   Name Target Date Goal status  1 Patient will be able to verbalize understanding of a home exercise program for cervical range of motion, posture, and walking.   Baseline:  No knowledge 12/02/2021 Achieved at eval  2 Patient will be able to verbalize understanding of proper sitting and standing posture. Baseline:  No knowledge 12/02/2021 Achieved at eval  3 Patient will be able to verbalize understanding of lymphedema risk and availability of treatment for this condition Baseline:  No knowledge 12/02/2021 Achieved at eval  4 Pt will demo she has regained full cervical ROM and function post operatively compared to baselines and not demonstrate any signs or symptoms of lymphedema.  Baseline: See objective measurements taken today. 01/27/2022 New     PLAN: PT FREQUENCY/DURATION: EVAL and 1 follow up appointment.   PLAN FOR NEXT SESSION: will reassess 2 weeks after completion of radiation to determine needs.  Patient will follow up at outpatient cancer rehab 2 weeks after completion of radiation.  If the patient requires physical therapy at that time, a specific plan will be dictated and sent to the referring physician for approval. The patient was educated today on appropriate basic  range of motion exercises to begin now and continue throughout radiation and educated on the signs and symptoms of lymphedema. Patient verbalized good understanding.     Physical Therapy Information for During and After Head/Neck Cancer Treatment: Lymphedema is a swelling condition that you may be at risk for in your neck and/or face if you have radiation treatment to the area and/or if you have surgery that includes removing lymph nodes.  There is treatment available for this condition and it is not life-threatening.  Contact your physician or physical therapist with concerns. An excellent resource for those seeking information on lymphedema is the National Lymphedema Network's website.  It can be accessed at [place].org If you notice swelling in your  neck or face at any time following surgery (even if it is many years from now), please contact your doctor or physical therapist to discuss this.  Lymphedema can be treated at any time but it is easier for you if it is treated early on. If you have had surgery to your neck, please check with your surgeon about how soon to start doing neck range of motion exercises.  If you are not having surgery, I encourage you to start doing neck range of motion exercises today and continue these while undergoing treatment, UNLESS you have irritation of your skin or soft tissue that is aggravated by doing them.  These exercises are intended to help you prevent loss of range of motion and/or to gain range of motion in your neck (which can be limited by tightening effects of radiation), and NOT to aggravate these tissues if they develop sensitivities from treatment. Neck range of motion exercises should be done to the point of feeling a GENTLE, TOLERABLE stretch only.  You are encouraged to start a walking or other exercise program tomorrow and continue this as much as you are able through and after treatment.  Please feel free to call me with any questions. Manus Gunning, PT, CLT Physical Therapist and Certified Lymphedema Therapist [facility] [address], [address], [place], [place] [address] [phone] [email or web address]  WALKING  Walking is a great form of exercise to increase your strength, endurance and overall fitness.  A walking program can help you start slowly and gradually build endurance as you go.  Everyone's ability is different, so each person's starting point will be different.  You do not have to follow them exactly.  The are just samples. You should simply find out what's right for you and stick to that program.   In the beginning, you'll start off walking 2-3 times a day for short distances.  As you get stronger, you'll be walking further at just 1-2 times per day.  A. You Can Walk For A Certain Length Of Time Each Day    Walk 5 minutes 3 times per day.  Increase 2 minutes every 2 days (3 times per day).  Work up to 25-30 minutes (1-2 times per day).   Example:   Day 1-2 5 minutes 3 times per day   Day 7-8 12 minutes 2-3 times per day   Day 13-14 25 minutes 1-2 times per day  B. You Can Walk For a Certain Distance Each Day     Distance can be substituted for time.    Example:   3 trips to mailbox (at road)   3 trips to corner of block   3 trips around the block  C. Go to local high school and use the track.    Walk for distance ____ around track  Or time ____ minutes  D. Walk ____ Jog ____ Run ___   Why exercise?  So many benefits! Here are SOME of them: Heart health, including raising your good cholesterol level and reducing heart rate and blood pressure Lung health, including improved lung capacity It burns fats, and most of [place] can stand to be leaner, whether or not we are overweight. It increases the body's natural painkillers and mood elevators, so makes you feel better. Not only makes you feel better, but look better too Improves sleep Takes a bite out of  stress May decrease your risk of many types of cancer If you are currently undergoing cancer treatment,  exercise may improve your ability to tolerate treatments including chemotherapy. For everybody, it can improve your energy level. Those with cancer-related fatigue report a 40-50% reduction in this symptom when exercising regularly. If you are a survivor of breast, colon, or prostate cancer, it may decrease your risk of a recurrence. (This may hold for other cancers too, but so far we have data just for these three types.)  How to exercise: Get your doctor's okay. Pick something you enjoy doing, like walking, Zumba, biking, swimming, or whatever. Start at low intensity and time, then gradually increase.  (See walking program handout.) Set a goal to achieve over time.  The American Cancer Society, American Heart Association, and U.S. Dept. of Health and Human Services recommend 150 minutes of moderate exercise, 75 minutes of vigorous exercise, or a combination of both per week. This should be done in episodes at least 10 minutes long, spread throughout the week.  Need help being motivated? Pick something you enjoy doing, because you'll be more inclined to stick with that activity than something that feels like a chore. Do it with a friend so that you are accountable to each other. Schedule it into your day. Place it on your calendar and keep that appointment just like you do any appointment that you make. Join an exercise group that meets at a specific time.  That way, you have to show up on time, and that makes it harder to procrastinate about doing your workout.  It also keeps you accountable--people begin to expect you to be there. Join a gym where you feel comfortable and not intimidated, at the right cost. Sign up for something that you'll need to be in shape for on a specific date, like a 1K or a 5K to walk or run, a 20 or 30 mile bike ride, a mud run or something like that. If the date is  looming, you know you'll need to train to be ready for it.  An added benefit is that many of these are fundraisers for good causes. If you've already paid for a gym membership, group exercise class or event, you might as well work out, so you haven't wasted your money!    [facility] Bryan Bonanza, PT 12/02/2021, 8:49 AM

## 2021-12-03 ENCOUNTER — Other Ambulatory Visit: Payer: Self-pay | Admitting: Hematology and Oncology

## 2021-12-03 ENCOUNTER — Ambulatory Visit
Admission: RE | Admit: 2021-12-03 | Discharge: 2021-12-03 | Disposition: A | Discharge status: Home / Self Care | Payer: No Typology Code available for payment source | Source: Ambulatory Visit | Attending: Radiation Oncology | Admitting: Radiation Oncology

## 2021-12-03 DIAGNOSIS — Z5111 Encounter for antineoplastic chemotherapy: Secondary | ICD-10-CM | POA: Diagnosis not present

## 2021-12-03 MED ORDER — CHLORPROMAZINE HCL 10 MG PO TABS: 10.0000 mg | ORAL_TABLET | Freq: Three times a day (TID) | ORAL | 0 refills | Status: DC | PRN

## 2021-12-03 NOTE — Progress Notes (Signed)
Thorazine sent for hiccups. He should stop compazine. ? ?Sherwood Castilla  ? ? ? ?

## 2021-12-03 NOTE — Progress Notes (Addendum)
Oncology Nurse Navigator Documentation  ? ?[name] [name] called me today reporting hiccups which were severe at times after his chemotherapy yesterday. He also had them after his first cycle last week but they improved after a few days. Today he was concerned about being able to stay still during his radiation treatment and asked if Dr. Chryl Heck could prescribe him something to help with the hiccups. Dr. Chryl Heck prescribed thorazine and asked me to call him to explain the side effects. I called Mr. Allred back today and told him that the thorazine had been prescribed and to watch for side effects including drowsiness, dizziness, lightheadedness, dry mouth, tiredness, nausea and not to drive while taking the medication. I also advised that he not take the prochlorperazine recently prescribed by Dr. Chryl Heck for nausea due to side effects with thorazine. He voiced his understanding and knows to call me if he has any concerns or questions.  ? ?Harlow Asa RN, BSN, OCN ?Head & Neck Oncology Nurse Navigator ?[place] [facility] ?Phone # [phone]  ?Fax # [phone]  ?

## 2021-12-06 ENCOUNTER — Ambulatory Visit
Admission: RE | Admit: 2021-12-06 | Discharge: 2021-12-06 | Disposition: A | Discharge status: Home / Self Care | Payer: No Typology Code available for payment source | Source: Ambulatory Visit | Attending: Radiation Oncology | Admitting: Radiation Oncology

## 2021-12-06 ENCOUNTER — Other Ambulatory Visit: Payer: Self-pay

## 2021-12-06 DIAGNOSIS — Z5111 Encounter for antineoplastic chemotherapy: Secondary | ICD-10-CM | POA: Diagnosis not present

## 2021-12-07 ENCOUNTER — Ambulatory Visit
Admission: RE | Admit: 2021-12-07 | Discharge: 2021-12-07 | Disposition: A | Discharge status: Home / Self Care | Payer: No Typology Code available for payment source | Source: Ambulatory Visit | Attending: Radiation Oncology | Admitting: Radiation Oncology

## 2021-12-07 DIAGNOSIS — Z5111 Encounter for antineoplastic chemotherapy: Secondary | ICD-10-CM | POA: Diagnosis not present

## 2021-12-08 ENCOUNTER — Other Ambulatory Visit: Payer: Self-pay

## 2021-12-08 ENCOUNTER — Inpatient Hospital Stay: Payer: No Typology Code available for payment source

## 2021-12-08 ENCOUNTER — Encounter: Payer: Self-pay | Admitting: Adult Health

## 2021-12-08 ENCOUNTER — Inpatient Hospital Stay (HOSPITAL_BASED_OUTPATIENT_CLINIC_OR_DEPARTMENT_OTHER): Payer: No Typology Code available for payment source | Admitting: Adult Health

## 2021-12-08 ENCOUNTER — Ambulatory Visit
Admission: RE | Admit: 2021-12-08 | Discharge: 2021-12-08 | Disposition: A | Discharge status: Home / Self Care | Payer: No Typology Code available for payment source | Source: Ambulatory Visit | Attending: Radiation Oncology | Admitting: Radiation Oncology

## 2021-12-08 VITALS — BP 112/73 | HR 63 | Temp 97.4°F | Resp 18 | Ht 68.0 in | Wt 163.2 lb

## 2021-12-08 DIAGNOSIS — C099 Malignant neoplasm of tonsil, unspecified: Secondary | ICD-10-CM | POA: Diagnosis not present

## 2021-12-08 DIAGNOSIS — Z95828 Presence of other vascular implants and grafts: Secondary | ICD-10-CM

## 2021-12-08 DIAGNOSIS — Z5111 Encounter for antineoplastic chemotherapy: Secondary | ICD-10-CM | POA: Diagnosis not present

## 2021-12-08 LAB — BASIC METABOLIC PANEL - CANCER CENTER ONLY
Anion gap: 5 (ref 5–15)
BUN: 18 mg/dL (ref 8–23)
CO2: 26 mmol/L (ref 22–32)
Calcium: 9 mg/dL (ref 8.9–10.3)
Chloride: 105 mmol/L (ref 98–111)
Creatinine: 0.69 mg/dL (ref 0.61–1.24)
GFR, Estimated: >60 mL/min (ref >60)
Glucose, Bld: 178 mg/dL — ABNORMAL HIGH (ref 70–99)
Potassium: 4.2 mmol/L (ref 3.5–5.1)
Sodium: 136 mmol/L (ref 135–145)

## 2021-12-08 LAB — CBC WITH DIFFERENTIAL (CANCER CENTER ONLY)
Abs Immature Granulocytes: 0.02 10*3/uL (ref 0.00–0.07)
Basophils Absolute: 0 10*3/uL (ref 0.0–0.1)
Basophils Relative: 1 %
Eosinophils Absolute: 0.1 10*3/uL (ref 0.0–0.5)
Eosinophils Relative: 1 %
HCT: 35 % — ABNORMAL LOW (ref 39.0–52.0)
Hemoglobin: 12.7 g/dL — ABNORMAL LOW (ref 13.0–17.0)
Immature Granulocytes: 0 %
Lymphocytes Relative: 15 %
Lymphs Abs: 1.2 10*3/uL (ref 0.7–4.0)
MCH: 34.7 pg — ABNORMAL HIGH (ref 26.0–34.0)
MCHC: 36.3 g/dL — ABNORMAL HIGH (ref 30.0–36.0)
MCV: 95.6 fL (ref 80.0–100.0)
Monocytes Absolute: 0.8 10*3/uL (ref 0.1–1.0)
Monocytes Relative: 10 %
Neutro Abs: 5.6 10*3/uL (ref 1.7–7.7)
Neutrophils Relative %: 73 %
Platelet Count: 163 10*3/uL (ref 150–400)
RBC: 3.66 MIL/uL — ABNORMAL LOW (ref 4.22–5.81)
RDW: 11.4 % — ABNORMAL LOW (ref 11.5–15.5)
WBC Count: 7.7 10*3/uL (ref 4.0–10.5)
nRBC: 0 % (ref 0.0–0.2)

## 2021-12-08 LAB — MAGNESIUM: Magnesium: 1.7 mg/dL (ref 1.7–2.4)

## 2021-12-08 MED ORDER — SODIUM CHLORIDE 0.9% FLUSH
10.0000 mL | INTRAVENOUS | Status: DC | PRN
  Administered 2021-12-08: 10 mL via INTRAVENOUS

## 2021-12-08 MED ORDER — OXYCODONE HCL 5 MG PO TABS: 5.0000 mg | ORAL_TABLET | ORAL | 0 refills | Status: DC | PRN

## 2021-12-08 MED FILL — Fosaprepitant Dimeglumine For IV Infusion 150 MG (Base Eq): INTRAVENOUS | Qty: 5 | Status: AC

## 2021-12-08 MED FILL — Dexamethasone Sodium Phosphate Inj 100 MG/10ML: INTRAMUSCULAR | Qty: 1 | Status: AC

## 2021-12-08 NOTE — Progress Notes (Signed)
[facility] Follow up:    [name] Sites, MD [address] 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 ([place]) Staging form: Pharynx - P16 Negative Oropharynx, AJCC 8th Edition - Clinical stage from 10/26/2021: Stage IVA (cT2, cN2c, cM0, p16-) - Signed by Eppie Gibson, MD on 10/26/2021 Stage prefix: Initial diagnosis  Squamous cell carcinoma of left tonsil (HCC) Staging form: Pharynx - P16 Negative Oropharynx, AJCC 8th Edition - Clinical stage from 10/26/2021: Stage IVA (cT2, cN2c, cM0, p16-) - Signed by Eppie Gibson, MD on 10/26/2021 Stage prefix: Initial diagnosis   SUMMARY OF ONCOLOGIC HISTORY: Oncology History  Squamous cell carcinoma of left tonsil ([place])  10/25/2021 Initial Diagnosis   Squamous cell carcinoma of left tonsil ([place])   10/26/2021 Cancer Staging   Staging form: Pharynx - P16 Negative Oropharynx, AJCC 8th Edition - Clinical stage from 10/26/2021: Stage IVA (cT2, cN2c, cM0, p16-) - Signed by Eppie Gibson, MD on 10/26/2021 Stage prefix: Initial diagnosis    11/25/2021 -  Chemotherapy   Patient is on Treatment Plan : HEAD/NECK Cisplatin q7d     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  He began chemotherapy with weekly cisplatin on November 25, 2021.  [name] [name] that he is doing well today.  He does have some throat pain and he takes oxycodone nightly for  this to help him sleep.  He experiences a dyne aphasia but denies dysphagia.  He is eating and drinking well and his weight is stable at 163 pounds.  His port has not been giving him any issues.  He does occasionally vomit after his chemo.  He has Compazine and Zofran on hand if needed.  He states the issue is more vomiting than nausea.  Overall he is doing well otherwise.  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 ([place]) 10/26/2021   Squamous cell carcinoma of left tonsil ([place]) 10/25/2021   Avascular necrosis of hip, right ([place]) 05/03/2018   Tobacco abuse 12/12/2011   Cellulitis of second finger of right hand 05/18/2011   Cellulitis and abscess of finger 05/18/2011   Cellulitis of hand 05/18/2011   Cellulitis of index finger 05/18/2011   Snake bite poisoning 05/17/2011   Swelling of second finger of right hand 05/17/2011   Pain in finger of right hand 05/17/2011   HTN (hypertension) 05/17/2011   Hyperlipidemia 05/17/2011   FATTY LIVER DISEASE 12/04/2008   ABDOMINAL PAIN-RUQ 12/04/2008    is allergic to  codeine.  MEDICAL HISTORY: Past Medical History:  Diagnosis Date   Acute kidney failure ([place]) 04/2018   CKD (chronic kidney disease), stage II    Complication of anesthesia    DDD (degenerative disc disease), lumbar    History of pancreatitis    20-30 years ago   History of venomous snake bite    rt index finger-   Hypercholesterolemia    Hypertension    Nerve pain     Right leg, resolved with stimulator   PONV (postoperative nausea and vomiting)    S/P insertion of spinal cord stimulator 08/2017   Spinal stenosis    Tobacco abuse     SURGICAL HISTORY: Past Surgical History:  Procedure Laterality Date   COLONOSCOPY     FINGER EXPLORATION  05/2011   rt index finger [id number]   HERNIA REPAIR     as a child   I & D EXTREMITY  11/24/2011   Procedure: IRRIGATION AND DEBRIDEMENT EXTREMITY;  Surgeon: Tennis Must, MD;  Location: [name];  Service: Orthopedics;  Laterality: Right;  right index   IR IMAGING GUIDED PORT INSERTION  11/17/2021   IR US GUIDE VASC ACCESS RIGHT  11/17/2021   MASS EXCISION  01/23/2012   Procedure: EXCISION MASS;  Surgeon: Tennis Must, MD;  Location: [facility];  Service: Orthopedics;  Laterality: Right;  Right index a-cell graft   TOTAL HIP ARTHROPLASTY Right 05/03/2018   Procedure: RIGHT TOTAL HIP ARTHROPLASTY ANTERIOR APPROACH;  Surgeon: Rod Can, MD;  Location: [facility];  Service: Orthopedics;  Laterality: Right;  Needs RNFA    SOCIAL HISTORY: Social History   Socioeconomic History   Marital status: Married    Spouse name: Not on file   Number of children: Not on file   Years of education: Not on file   Highest education level: Not on file  Occupational History   Not on file  Tobacco Use   Smoking status: Every Day    Packs/day: 0.50    Years: 20.00    Pack years: 10.00    Types: Cigarettes   Smokeless tobacco: Never  Vaping Use   Vaping Use: Never used  Substance and Sexual Activity   Alcohol use: Yes    Comment: occasional   Drug use: No   Sexual activity: Not Currently  Other Topics Concern   Not on file  Social History Narrative   Not on file   Social Determinants of Health   Financial Resource Strain: Low Risk    Difficulty of Paying Living Expenses: Not hard at all  Food Insecurity: No Food Insecurity   Worried About Charity fundraiser in the Last Year:  Never true   Ran Out of Food in the Last Year: Never true  Transportation Needs: No Transportation Needs   Lack of Transportation (Medical): No   Lack of Transportation (Non-Medical): No  Physical Activity: Not on file  Stress: Not on file  Social Connections: Not on file  Intimate Partner Violence: Not on file    FAMILY HISTORY: Family History  Problem Relation Age of Onset   Diabetes Mother    Throat cancer Brother     Review of Systems  Constitutional:  Positive for fatigue. Negative for appetite change, chills, fever and unexpected weight change.  HENT:   Positive for sore throat. Negative for hearing loss, lump/mass, mouth sores and trouble swallowing.   Eyes:  Negative for eye problems and icterus.  Respiratory:  Negative for  chest tightness, cough and shortness of breath.   Cardiovascular:  Negative for chest pain, leg swelling and palpitations.  Gastrointestinal:  Positive for vomiting. Negative for abdominal distention, abdominal pain, constipation, diarrhea and nausea.  Endocrine: Negative for hot flashes.  Genitourinary:  Negative for difficulty urinating.   Musculoskeletal:  Negative for arthralgias.  Skin:  Negative for itching and rash.  Neurological:  Negative for dizziness, extremity weakness, headaches and numbness.  Hematological:  Negative for adenopathy. Does not bruise/bleed easily.  Psychiatric/Behavioral:  Negative for depression. The patient is not nervous/anxious.      PHYSICAL EXAMINATION  ECOG PERFORMANCE STATUS: 1 - Symptomatic but completely ambulatory  Vitals:   12/08/21 0912  BP: 112/73  Pulse: 63  Resp: 18  Temp: (!) 97.4 F (36.3 C)  SpO2: 98%    Physical Exam Constitutional:      General: He is not in acute distress.    Appearance: Normal appearance. He is not toxic-appearing.  HENT:     Head: Normocephalic and atraumatic.     Mouth/Throat:     Mouth: Mucous membranes are moist.     Pharynx: No oropharyngeal exudate.  Eyes:      General: No scleral icterus. Cardiovascular:     Rate and Rhythm: Normal rate and regular rhythm.     Pulses: Normal pulses.     Heart sounds: Normal heart sounds.  Pulmonary:     Effort: Pulmonary effort is normal.     Breath sounds: Normal breath sounds.  Abdominal:     General: Abdomen is flat. Bowel sounds are normal. There is no distension.     Palpations: Abdomen is soft.     Tenderness: There is no abdominal tenderness.  Musculoskeletal:        General: No swelling.     Cervical back: Neck supple.  Lymphadenopathy:     Cervical: No cervical adenopathy.  Skin:    General: Skin is warm and dry.     Findings: No rash.  Neurological:     General: No focal deficit present.     Mental Status: He is alert.  Psychiatric:        Mood and Affect: Mood normal.        Behavior: Behavior normal.    LABORATORY DATA:  CBC    Component Value Date/Time   WBC 7.7 12/08/2021 0906   WBC 7.1 12/01/2021 0910   RBC 3.66 (L) 12/08/2021 0906   HGB 12.7 (L) 12/08/2021 0906   HCT 35.0 (L) 12/08/2021 0906   PLT 163 12/08/2021 0906   MCV 95.6 12/08/2021 0906   MCH 34.7 (H) 12/08/2021 0906   MCHC 36.3 (H) 12/08/2021 0906   RDW 11.4 (L) 12/08/2021 0906   LYMPHSABS 1.2 12/08/2021 0906   MONOABS 0.8 12/08/2021 0906   EOSABS 0.1 12/08/2021 0906   BASOSABS 0.0 12/08/2021 0906    CMP     Component Value Date/Time   NA 136 12/08/2021 0906   K 4.2 12/08/2021 0906   CL 105 12/08/2021 0906   CO2 26 12/08/2021 0906   GLUCOSE 178 (H) 12/08/2021 0906   BUN 18 12/08/2021 0906   CREATININE 0.69 12/08/2021 0906   CALCIUM 9.0 12/08/2021 0906   PROT 6.3 (L) 12/01/2021 0910   ALBUMIN 3.6 12/01/2021 0910   AST 44 (H) 12/01/2021 0910   AST 72 (H) 10/25/2021 1053   ALT 95 (H) 12/01/2021 0910   ALT 48 (H) 10/25/2021 1053   ALKPHOS 83 12/01/2021 0910   BILITOT 0.5 12/01/2021  0910   BILITOT 1.0 10/25/2021 1053   GFRNONAA >60 12/08/2021 0906   GFRAA >60 05/04/2018 0536       ASSESSMENT  and THERAPY PLAN:   Squamous cell carcinoma of left tonsil (HCC) [name] [name] is a 66 year old man with stage IVa 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  1.  Stage IVa p16 negative squamous cell carcinoma of the oropharynx: He will continue on weekly cisplatin and daily radiation.  He is tolerating this moderately well.  His weight is stable.  2.  Vomiting: He has Zofran and Compazine on hand if needed.  He will return daily for radiation, and we will see him next week prior to his cisplatin.  All questions were answered. The patient knows to call the clinic with any problems, questions or concerns. We can certainly see the patient much sooner if necessary.  Total encounter time: 20 minutes in face-to-face visit time, chart review, lab review, care coordination, order entry, and documentation of the encounter.  Wilber Bihari, NP 12/08/21 9:47 AM Medical Oncology and Hematology [facility] [place], [place] [address] Tel. [phone]    Fax. [phone]  *Total Encounter Time as defined by the Centers for Medicare and Medicaid Services includes, in addition to the face-to-face time of a patient visit (documented in the note above) non-face-to-face time: obtaining and reviewing outside history, ordering and reviewing medications, tests or procedures, care coordination (communications with other health care professionals or caregivers) and documentation in the medical record.

## 2021-12-08 NOTE — Assessment & Plan Note (Signed)
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 ? ?1.  Stage IVa p16 negative squamous cell carcinoma of the oropharynx: He will continue on weekly cisplatin and daily radiation.  He is tolerating this moderately well.  His weight is stable. ? ?2.  Vomiting: He has Zofran and Compazine on hand if needed. ? ?He will return daily for radiation, and we will see him next week prior to his cisplatin. ?

## 2021-12-09 ENCOUNTER — Inpatient Hospital Stay: Payer: No Typology Code available for payment source | Admitting: Nutrition

## 2021-12-09 ENCOUNTER — Ambulatory Visit
Admission: RE | Admit: 2021-12-09 | Discharge: 2021-12-09 | Disposition: A | Discharge status: Home / Self Care | Payer: No Typology Code available for payment source | Source: Ambulatory Visit | Attending: Radiation Oncology | Admitting: Radiation Oncology

## 2021-12-09 ENCOUNTER — Inpatient Hospital Stay: Payer: No Typology Code available for payment source

## 2021-12-09 ENCOUNTER — Telehealth: Payer: Self-pay

## 2021-12-09 VITALS — BP 97/65 | HR 65 | Temp 98.5°F | Resp 16 | Ht 68.0 in | Wt 161.0 lb

## 2021-12-09 DIAGNOSIS — Z5111 Encounter for antineoplastic chemotherapy: Secondary | ICD-10-CM | POA: Diagnosis not present

## 2021-12-09 DIAGNOSIS — C099 Malignant neoplasm of tonsil, unspecified: Secondary | ICD-10-CM

## 2021-12-09 MED ORDER — SODIUM CHLORIDE 0.9 % IV SOLN
10.0000 mg | Freq: Once | INTRAVENOUS | Status: AC
  Administered 2021-12-09: 10:00:00 10 mg via INTRAVENOUS
  Filled 2021-12-09: qty 10

## 2021-12-09 MED ORDER — HEPARIN SOD (PORK) LOCK FLUSH 100 UNIT/ML IV SOLN
500.0000 [IU] | Freq: Once | INTRAVENOUS | Status: AC | PRN
  Administered 2021-12-09: 15:00:00 500 [IU]

## 2021-12-09 MED ORDER — MAGNESIUM SULFATE 2 GM/50ML IV SOLN
2.0000 g | Freq: Once | INTRAVENOUS | Status: AC
  Administered 2021-12-09: 08:00:00 2 g via INTRAVENOUS
  Filled 2021-12-09: qty 50

## 2021-12-09 MED ORDER — PALONOSETRON HCL INJECTION 0.25 MG/5ML
0.2500 mg | Freq: Once | INTRAVENOUS | Status: AC
  Administered 2021-12-09: 09:00:00 0.25 mg via INTRAVENOUS
  Filled 2021-12-09: qty 5

## 2021-12-09 MED ORDER — SODIUM CHLORIDE 0.9 % IV SOLN
40.0000 mg/m2 | Freq: Once | INTRAVENOUS | Status: AC
  Administered 2021-12-09: 11:00:00 74 mg via INTRAVENOUS
  Filled 2021-12-09: qty 74

## 2021-12-09 MED ORDER — SODIUM CHLORIDE 0.9% FLUSH
10.0000 mL | INTRAVENOUS | Status: DC | PRN
  Administered 2021-12-09: 15:00:00 10 mL

## 2021-12-09 MED ORDER — SODIUM CHLORIDE 0.9 % IV SOLN
150.0000 mg | Freq: Once | INTRAVENOUS | Status: AC
  Administered 2021-12-09: 10:00:00 150 mg via INTRAVENOUS
  Filled 2021-12-09: qty 150

## 2021-12-09 MED ORDER — SODIUM CHLORIDE 0.9 % IV SOLN: Freq: Once | INTRAVENOUS | Status: AC

## 2021-12-09 MED ORDER — POTASSIUM CHLORIDE IN NACL 20-0.9 MEQ/L-% IV SOLN
Freq: Once | INTRAVENOUS | Status: AC
  Filled 2021-12-09: qty 1000

## 2021-12-09 NOTE — Telephone Encounter (Signed)
Attempted to call pt to obtain more information regarding [employer] paperwork he turned in to np. Pt's wife states she is unaware and will let him know to call us. I attempted to call pt's cell phone. VM box full.  ?

## 2021-12-09 NOTE — Patient Instructions (Signed)
[facility] MEDICAL ONCOLOGY  Discharge Instructions: ?Thank you for choosing [facility] to provide your oncology and hematology care.  ? ?If you have a lab appointment with the Cancer Center, please go directly to the Cancer Center and check in at the registration area. ?  ?Wear comfortable clothing and clothing appropriate for easy access to any Portacath or PICC line.  ? ?We strive to give you quality time with your provider. You may need to reschedule your appointment if you arrive late (15 or more minutes).  Arriving late affects you and other patients whose appointments are after yours.  Also, if you miss three or more appointments without notifying the office, you may be dismissed from the clinic at the provider?s discretion.    ?  ?For prescription refill requests, have your pharmacy contact our office and allow 72 hours for refills to be completed.   ? ?Today you received the following chemotherapy and/or immunotherapy agent: Cisplatin ?  ?To help prevent nausea and vomiting after your treatment, we encourage you to take your nausea medication as directed. ? ?BELOW ARE SYMPTOMS THAT SHOULD BE REPORTED IMMEDIATELY: ?*FEVER GREATER THAN 100.4 F (38 ?C) OR HIGHER ?*CHILLS OR SWEATING ?*NAUSEA AND VOMITING THAT IS NOT CONTROLLED WITH YOUR NAUSEA MEDICATION ?*UNUSUAL SHORTNESS OF BREATH ?*UNUSUAL BRUISING OR BLEEDING ?*URINARY PROBLEMS (pain or burning when urinating, or frequent urination) ?*BOWEL PROBLEMS (unusual diarrhea, constipation, pain near the anus) ?TENDERNESS IN MOUTH AND THROAT WITH OR WITHOUT PRESENCE OF ULCERS (sore throat, sores in mouth, or a toothache) ?UNUSUAL RASH, SWELLING OR PAIN  ?UNUSUAL VAGINAL DISCHARGE OR ITCHING  ? ?Items with * indicate a potential emergency and should be followed up as soon as possible or go to the Emergency Department if any problems should occur. ? ?Please show the CHEMOTHERAPY ALERT CARD or IMMUNOTHERAPY ALERT CARD at check-in to the  Emergency Department and triage nurse. ? ?Should you have questions after your visit or need to cancel or reschedule your appointment, please contact [facility]ICAL ONCOLOG[facility]  Dept: [phone]  and follow the prompts.  Office hours are 8:00 a.m. to 4:30 p.m. Monday - Friday. Please note that voicemails left after 4:00 p.m. may not be returned until the following business day.  We are closed weekends and major holidays. You have access to a nurse at all times for urgent questions. Please call the main number to the clinic Dept: [phone] and follow the prompts. ? ? ?For any non-urgent questions, you may also contact your provider using MyChart. We now offer e-Visits for anyone 18 and older to request care online for non-urgent symptoms. For details visit [email or web address]. ?  ?Also download the MyChart app! Go to the app store, search "MyChart", open the app, select [facility], and log in with your MyChart username and password. ? ?Due to Covid, a mask is required upon entering the hospital/clinic. If you do not have a mask, one will be given to you upon arrival. For doctor visits, patients may have 1 support person aged 18 or older with them. For treatment visits, patients cannot have anyone with them due to current Covid guidelines and our immunocompromised population.  ? ?

## 2021-12-09 NOTE — Progress Notes (Signed)
Nutrition follow-up completed with patient during infusion for left tonsil cancer.  Patient is receiving concurrent chemoradiation therapy with weekly cisplatin.  He is followed by Dr. Chryl Heck and Dr. Isidore Moos.  He has declined PEG placement. ? ?Weight decreased and documented as 161 pounds March 9 down from 164.7 pounds March 1.  He has lost 2% of his body weight over 4 weeks which is not significant. ? ?Labs noted: Glucose 178. ? ?Patient reports he continues to have several episodes of vomiting after chemotherapy.  He has nausea medication which he will begin per MD instructions. ?He reports hiccups after infusion which are very bothersome.  He has a prescription for Thorazine now. ?Reports his throat is sore but he is dealing with it. ?Denies constipation or diarrhea. ?Patient drinking 3-4 cartons of Ensure Plus or equivalent daily.  He has a "whole refrigerator full."  He is still trying to eat some soft foods. ? ?Nutrition diagnosis: Inadequate oral intake continues. ? ?Intervention: ?Continue Ensure plus or equivalent 4 times daily.  Provided coupons. ?Continue milkshakes as desired. ?Encourage soft moist foods frequently throughout the day. ?Stressed importance of adequate calories and protein for weight maintenance. ?Continue medications as prescribed by MD. ? ?Monitoring, evaluation, goals: ?Patient will work to increase calories and protein to promote weight stability. ? ?Next visit: Thursday, March 16 during infusion. ? ?**Disclaimer: This note was dictated with voice recognition software. Similar sounding words can inadvertently be transcribed and this note may contain transcription errors which may not have been corrected upon publication of note.** ? ?

## 2021-12-10 ENCOUNTER — Ambulatory Visit
Admission: RE | Admit: 2021-12-10 | Discharge: 2021-12-10 | Disposition: A | Discharge status: Home / Self Care | Payer: No Typology Code available for payment source | Source: Ambulatory Visit | Attending: Radiation Oncology | Admitting: Radiation Oncology

## 2021-12-10 ENCOUNTER — Other Ambulatory Visit: Payer: Self-pay

## 2021-12-10 DIAGNOSIS — Z5111 Encounter for antineoplastic chemotherapy: Secondary | ICD-10-CM | POA: Diagnosis not present

## 2021-12-13 ENCOUNTER — Other Ambulatory Visit (HOSPITAL_COMMUNITY): Payer: Self-pay | Admitting: *Deleted

## 2021-12-13 ENCOUNTER — Ambulatory Visit
Admission: RE | Admit: 2021-12-13 | Discharge: 2021-12-13 | Disposition: A | Discharge status: Home / Self Care | Payer: No Typology Code available for payment source | Source: Ambulatory Visit | Attending: Radiation Oncology | Admitting: Radiation Oncology

## 2021-12-13 ENCOUNTER — Inpatient Hospital Stay: Payer: No Typology Code available for payment source | Admitting: General Practice

## 2021-12-13 DIAGNOSIS — Z5111 Encounter for antineoplastic chemotherapy: Secondary | ICD-10-CM | POA: Diagnosis not present

## 2021-12-13 NOTE — Progress Notes (Signed)
[facility] Spiritual Care Note ? ?[name] [name] completed his Advance Directives in [facility] today, choosing [name] as his health care agent. ? ?His special instructions read, "No feeding tube, no organ donation, [facility] service and burial at [facility] [place] [facility]. DNR." ? ?If he is unable to make or communicate health care decisions, he desires that his life NOT be prolonged in all three situations listed. That is, he has initialed for incurable condition, unconsciousness, and advanced dementia. ? ?In the event that the health care agent gives instructions that differ from desires expressed in the living will, he instructs his team to follow the living will. ? ? ?Chaplain Lorrin Jackson, MDiv, [facility] ?Pager [phone] ?Voicemail [phone]  ?

## 2021-12-14 ENCOUNTER — Ambulatory Visit
Admission: RE | Admit: 2021-12-14 | Discharge: 2021-12-14 | Disposition: A | Discharge status: Home / Self Care | Payer: No Typology Code available for payment source | Source: Ambulatory Visit | Attending: Radiation Oncology | Admitting: Radiation Oncology

## 2021-12-14 ENCOUNTER — Other Ambulatory Visit: Payer: Self-pay

## 2021-12-14 DIAGNOSIS — Z5111 Encounter for antineoplastic chemotherapy: Secondary | ICD-10-CM | POA: Diagnosis not present

## 2021-12-15 ENCOUNTER — Inpatient Hospital Stay: Payer: No Typology Code available for payment source

## 2021-12-15 ENCOUNTER — Inpatient Hospital Stay (HOSPITAL_BASED_OUTPATIENT_CLINIC_OR_DEPARTMENT_OTHER): Payer: No Typology Code available for payment source | Admitting: Hematology and Oncology

## 2021-12-15 ENCOUNTER — Encounter: Payer: Self-pay | Admitting: Hematology and Oncology

## 2021-12-15 ENCOUNTER — Ambulatory Visit
Admission: RE | Admit: 2021-12-15 | Discharge: 2021-12-15 | Disposition: A | Discharge status: Home / Self Care | Payer: No Typology Code available for payment source | Source: Ambulatory Visit | Attending: Radiation Oncology | Admitting: Radiation Oncology

## 2021-12-15 DIAGNOSIS — C099 Malignant neoplasm of tonsil, unspecified: Secondary | ICD-10-CM

## 2021-12-15 DIAGNOSIS — R066 Hiccough: Secondary | ICD-10-CM | POA: Diagnosis not present

## 2021-12-15 DIAGNOSIS — Z95828 Presence of other vascular implants and grafts: Secondary | ICD-10-CM

## 2021-12-15 DIAGNOSIS — K1231 Oral mucositis (ulcerative) due to antineoplastic therapy: Secondary | ICD-10-CM | POA: Diagnosis not present

## 2021-12-15 DIAGNOSIS — Z5111 Encounter for antineoplastic chemotherapy: Secondary | ICD-10-CM | POA: Diagnosis not present

## 2021-12-15 LAB — COMPREHENSIVE METABOLIC PANEL
ALT: 30 U/L (ref 0–44)
AST: 34 U/L (ref 15–41)
Albumin: 3.3 g/dL — ABNORMAL LOW (ref 3.5–5.0)
Alkaline Phosphatase: 63 U/L (ref 38–126)
Anion gap: 4 — ABNORMAL LOW (ref 5–15)
BUN: 17 mg/dL (ref 8–23)
CO2: 24 mmol/L (ref 22–32)
Calcium: 8.4 mg/dL — ABNORMAL LOW (ref 8.9–10.3)
Chloride: 107 mmol/L (ref 98–111)
Creatinine, Ser: 0.73 mg/dL (ref 0.61–1.24)
GFR, Estimated: >60 mL/min (ref >60)
Glucose, Bld: 111 mg/dL — ABNORMAL HIGH (ref 70–99)
Potassium: 3.9 mmol/L (ref 3.5–5.1)
Sodium: 135 mmol/L (ref 135–145)
Total Bilirubin: 0.2 mg/dL — ABNORMAL LOW (ref 0.3–1.2)
Total Protein: 5.7 g/dL — ABNORMAL LOW (ref 6.5–8.1)

## 2021-12-15 LAB — CBC WITH DIFFERENTIAL (CANCER CENTER ONLY)
Abs Immature Granulocytes: 0.02 10*3/uL (ref 0.00–0.07)
Basophils Absolute: 0 10*3/uL (ref 0.0–0.1)
Basophils Relative: 0 %
Eosinophils Absolute: 0.1 10*3/uL (ref 0.0–0.5)
Eosinophils Relative: 2 %
HCT: 31.2 % — ABNORMAL LOW (ref 39.0–52.0)
Hemoglobin: 11 g/dL — ABNORMAL LOW (ref 13.0–17.0)
Immature Granulocytes: 0 %
Lymphocytes Relative: 14 %
Lymphs Abs: 0.8 10*3/uL (ref 0.7–4.0)
MCH: 34 pg (ref 26.0–34.0)
MCHC: 35.3 g/dL (ref 30.0–36.0)
MCV: 96.3 fL (ref 80.0–100.0)
Monocytes Absolute: 0.5 10*3/uL (ref 0.1–1.0)
Monocytes Relative: 10 %
Neutro Abs: 3.9 10*3/uL (ref 1.7–7.7)
Neutrophils Relative %: 74 %
Platelet Count: 112 10*3/uL — ABNORMAL LOW (ref 150–400)
RBC: 3.24 MIL/uL — ABNORMAL LOW (ref 4.22–5.81)
RDW: 11.4 % — ABNORMAL LOW (ref 11.5–15.5)
WBC Count: 5.3 10*3/uL (ref 4.0–10.5)
nRBC: 0 % (ref 0.0–0.2)

## 2021-12-15 LAB — MAGNESIUM: Magnesium: 1.5 mg/dL — ABNORMAL LOW (ref 1.7–2.4)

## 2021-12-15 MED ORDER — CHLORPROMAZINE HCL 10 MG PO TABS: 10.0000 mg | ORAL_TABLET | Freq: Three times a day (TID) | ORAL | 0 refills | Status: DC | PRN

## 2021-12-15 MED ORDER — OXYCODONE HCL 5 MG PO TABS: 5.0000 mg | ORAL_TABLET | ORAL | 0 refills | Status: DC | PRN

## 2021-12-15 MED ORDER — HEPARIN SOD (PORK) LOCK FLUSH 100 UNIT/ML IV SOLN
500.0000 [IU] | Freq: Once | INTRAVENOUS | Status: AC
  Administered 2021-12-15: 500 [IU] via INTRAVENOUS

## 2021-12-15 MED ORDER — SODIUM CHLORIDE 0.9% FLUSH
10.0000 mL | INTRAVENOUS | Status: DC | PRN
  Administered 2021-12-15: 10 mL via INTRAVENOUS

## 2021-12-15 NOTE — Assessment & Plan Note (Signed)
Okay to continue oxycodone every 6-8 hours as needed for pain.  Pain is moderate, patient cannot swallow even milk however forces to eat food.  He still refuses G-tube.  We will continue to monitor the pain needs ?

## 2021-12-15 NOTE — Progress Notes (Signed)
[facility]: ?  ? ?[name] Sites, MD ?[address] ?[place] [address] ? ? 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 ?Squamous cell carcinoma of left tonsil (HCC) ?Staging form: Pharynx - P16 Negative Oropharynx, AJCC 8th Edition ?- Clinical stage from 10/26/2021: Stage IVA (cT2, cN2c, cM0, p16-) - Signed by Eppie Gibson, MD on 10/26/2021 ?Stage prefix: Initial diagnosis ? ? ?SUMMARY OF ONCOLOGIC HISTORY: ?Oncology History  ?Squamous cell carcinoma of left tonsil ([place])  ?10/25/2021 Initial Diagnosis  ? Squamous cell carcinoma of left tonsil ([place]) ?  ?10/26/2021 Cancer Staging  ? Staging form: Pharynx - P16 Negative Oropharynx, AJCC 8th Edition ?- Clinical stage from 10/26/2021: Stage IVA (cT2, cN2c, cM0, p16-) - Signed by Eppie Gibson, MD on 10/26/2021 ?Stage prefix: Initial diagnosis ? ?  ?11/25/2021 -  Chemotherapy  ? Patient is on Treatment Plan : HEAD/NECK Cisplatin q7d  ?   ?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 ([place])  ?10/26/2021 Initial Diagnosis  ? 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 ([place]) ?  ?10/26/2021 Cancer Staging  ? Staging form: Pharynx - P16 Negative Oropharynx, AJCC 8th Edition ?- Clinical stage from 10/26/2021: Stage IVA (cT2, cN2c, cM0, p16-) - Signed by Eppie Gibson, MD on 10/26/2021 ?Stage prefix: Initial diagnosis ? ?  ? ? ?CURRENT THERAPY: concurrent chemoradiation ? ?INTERVAL HISTORY: ? ?[name] [name] 66 y.o. 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  He began chemotherapy with weekly cisplatin on November 25, 2021. ? ?[name] [name] notes that he is very tired today. He has been having hiccups again, thorazine works well for you.  He has morning sickness the day after chemotherapy with no nausea.  ?Pain in the throat is twice as worse as before, everything hurts even drinking milk. But he is still forcing food ?No diarrhea, no tingling or numbness, no hearing changes. ?Urine is looking pale yellow, he is able to drink water 4 glasses, gatorade and ensure in the day, ?No fevers or chills ?Rest of the pertinent 10  ROS reviewed and negative ? ?Overall he is doing well otherwise. ? ?Patient Active Problem List  ? Diagnosis Date Noted  ? Mucositis due to antineoplastic therapy 12/15/2021  ? Intractable hiccups 12/01/2021  ? Chemotherapy induced nausea and vomiting 12/01/2021  ? Cancer related pain 11/16/2021  ? Encounter for preoperative dental examination 10/29/2021  ? Caries 10/29/2021  ? Teeth missing 10/29/2021  ? Chronic periodontitis 10/29/2021  ? Accretions on teeth 10/29/2021  ? 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 Excessive dental attrition 10/29/2021  ? Gingival recession, generalized 10/29/2021  ? Phobia of dental procedure 10/29/2021  ? 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 ([place]) 10/26/2021  ? Squamous cell carcinoma of left tonsil ([place]) 10/25/2021  ? Avascular necrosis of hip, right ([place]) 05/03/2018  ? Tobacco abuse 12/12/2011  ? Cellulitis of second finger of right hand 05/18/2011  ? Cellulitis and abscess of finger 05/18/2011  ? Cellulitis of hand 05/18/2011  ? Cellulitis of index finger 05/18/2011  ? Snake bite poisoning 05/17/2011  ? Swelling of second finger of right hand 05/17/2011  ? Pain in finger of right hand 05/17/2011  ? HTN (hypertension) 05/17/2011  ? Hyperlipidemia 05/17/2011  ?  FATTY LIVER DISEASE 12/04/2008  ? ABDOMINAL PAIN-RUQ 12/04/2008  ? ? ?is allergic to codeine. ? ?MEDICAL HISTORY: ?Past Medical History:  ?Diagnosis Date  ? Acute kidney failure ([place]) 04/2018  ? CKD (chronic kidney disease), stage II   ? Complication of anesthesia   ? DDD (degenerative disc disease), lumbar   ? History of pancreatitis    ? 20-30 years ago  ? History of venomous snake bite   ? rt index finger-  ? Hypercholesterolemia   ? Hypertension   ? Nerve pain   ? Right leg, resolved with stimulator  ? PONV (postoperative nausea and vomiting)   ? S/P insertion of spinal cord stimulator 08/2017  ? Spinal stenosis   ? Tobacco abuse   ? ? ?SURGICAL HISTORY: ?Past Surgical History:  ?Procedure Laterality Date  ? COLONOSCOPY    ? FINGER EXPLORATION  05/2011  ? rt index finger [id number]  ? HERNIA REPAIR    ? as a child  ? I & D EXTREMITY  11/24/2011  ? Procedure: IRRIGATION AND DEBRIDEMENT EXTREMITY;  Surgeon: Tennis Must, MD;  Location: [facility];  Service: Orthopedics;  Laterality: Right;  right index  ? IR IMAGING GUIDED PORT INSERTION  11/17/2021  ? IR US GUIDE VASC ACCESS RIGHT  11/17/2021  ? MASS EXCISION  01/23/2012  ? Procedure: EXCISION MASS;  Surgeon: Tennis Must, MD;  Location: [facility];  Service: Orthopedics;  Laterality: Right;  Right index a-cell graft  ? TOTAL HIP ARTHROPLASTY Right 05/03/2018  ? Procedure: RIGHT TOTAL HIP ARTHROPLASTY ANTERIOR APPROACH;  Surgeon: Rod Can, MD;  Location: [facility];  Service: Orthopedics;  Laterality: Right;  Needs RNFA  ? ? ?SOCIAL HISTORY: ?Social History  ? ?Socioeconomic History  ? Marital status: Married  ?  Spouse name: Not on file  ? Number of children: Not on file  ? Years of education: Not on file  ? Highest education level: Not on file  ?Occupational History  ? Not on file  ?Tobacco Use  ? Smoking status: Every Day  ?  Packs/day: 0.50  ?  Years: 20.00  ?  Pack years: 10.00  ?  Types: Cigarettes  ? Smokeless tobacco: Never  ?Vaping Use  ? Vaping Use: Never used  ?Substance and Sexual Activity  ? Alcohol use: Yes  ?  Comment: occasional  ? Drug use: No  ? Sexual activity: Not Currently  ?Other Topics Concern  ? Not on file  ?Social History Narrative  ? Not on file  ? ?Social Determinants of Health  ? ?Financial Resource Strain: Low Risk   ? Difficulty of  Paying Living Expenses: Not hard at all  ?Food Insecurity: No Food Insecurity  ? Worried About Charity fundraiser in the Last Year: Never true  ? Ran Out of Food in the Last Year: Never true  ?Transportation Needs: No Transportation Needs  ? Lack of Transportation (Medical): No  ? Lack of Transportation (Non-Medical): No  ?Physical Activity: Not on file  ?Stress: Not on file  ?Social Connections: Not on file  ?Intimate Partner Violence: Not on file  ? ? ?FAMILY HISTORY: ?Family History  ?Problem Relation Age of Onset  ? Diabetes Mother   ? Throat cancer Brother   ? ? ?Review of Systems  ?Constitutional:  Positive for fatigue. Negative for appetite change, chills, fever and unexpected weight change.  ?HENT:   Positive for sore throat. Negative for hearing loss, lump/mass, mouth sores and trouble swallowing.   ?  Eyes:  Negative for eye problems and icterus.  ?Respiratory:  Negative for chest tightness, cough and shortness of breath.   ?Cardiovascular:  Negative for chest pain, leg swelling and palpitations.  ?Gastrointestinal:  Positive for vomiting. Negative for abdominal distention, abdominal pain, constipation, diarrhea and nausea.  ?Endocrine: Negative for hot flashes.  ?Genitourinary:  Negative for difficulty urinating.   ?Musculoskeletal:  Negative for arthralgias.  ?Skin:  Negative for itching and rash.  ?Neurological:  Negative for dizziness, extremity weakness, headaches and numbness.  ?Hematological:  Negative for adenopathy. Does not bruise/bleed easily.  ?Psychiatric/Behavioral:  Negative for depression. The patient is not nervous/anxious.    ? ? ?PHYSICAL EXAMINATION ? ?ECOG PERFORMANCE STATUS: 1 - Symptomatic but completely ambulatory ? ?Vitals:  ? 12/15/21 1417  ?BP: 122/77  ?Pulse: 65  ?Resp: 18  ?Temp: 98.6 ?F (37 ?C)  ?SpO2: 100%  ? ? ?Physical Exam ?Constitutional:   ?   General: He is not in acute distress. ?   Appearance: Normal appearance. He is not toxic-appearing.  ?HENT:  ?   Head:  Normocephalic and atraumatic.  ?   Mouth/Throat:  ?   Mouth: Mucous membranes are moist.  ?   Pharynx: Posterior oropharyngeal erythema (Erythema posterior oropharynx without any ulceration) present. No oropharyn

## 2021-12-15 NOTE — Assessment & Plan Note (Signed)
Continue on Thorazine as needed, likely related to dexamethasone although patient states they happened even before he took dexamethasone.  Thorazine has been working very well for him so far ?

## 2021-12-15 NOTE — Assessment & Plan Note (Signed)
This is a very pleasant 66 year old male patient, current everyday smoker with newly diagnosed squamous cell carcinoma of the left tonsil, p16 negative, staging T1 versus T2, borderline size, and N2 with bilateral cervical lymphadenopathy which are hypermetabolic on PET imaging and M0 with no evidence of distant metastasis referred to medical oncology for consideration of concurrent chemotherapy and radiation.   ?Given bilateral cervical lymphadenopathy, we have discussed about considering concurrent chemotherapy and radiation as frontline treatment.  ?He went to [facility] for a second opinion about role of surgery. He decided to proceed with CRT locally after talking to surgery team. ? ?Cycle 1 day 1 of chemotherapy started on 11/24/2021 ?Cycle 2-day 1 on 12/02/2021 ?Cycle 3-day 1 on 12/09/2021 ?Cycle 4-day 1 anticipated tomorrow. ?He has been tolerating chemotherapy well except for grade 1 nausea and mucositis.  CBC reviewed and satisfactory, rest of the labs are pending.  Okay to proceed with chemotherapy tomorrow as planned if labs are all within parameters. ?Return to clinic in 1 week ?

## 2021-12-16 ENCOUNTER — Inpatient Hospital Stay: Payer: No Typology Code available for payment source

## 2021-12-16 ENCOUNTER — Other Ambulatory Visit: Payer: Self-pay | Admitting: Hematology and Oncology

## 2021-12-16 ENCOUNTER — Inpatient Hospital Stay: Payer: No Typology Code available for payment source | Admitting: Nutrition

## 2021-12-16 ENCOUNTER — Other Ambulatory Visit: Payer: Self-pay

## 2021-12-16 ENCOUNTER — Ambulatory Visit
Admission: RE | Admit: 2021-12-16 | Discharge: 2021-12-16 | Disposition: A | Discharge status: Home / Self Care | Payer: No Typology Code available for payment source | Source: Ambulatory Visit | Attending: Radiation Oncology | Admitting: Radiation Oncology

## 2021-12-16 VITALS — BP 103/72 | HR 72 | Temp 98.4°F | Resp 18

## 2021-12-16 DIAGNOSIS — Z5111 Encounter for antineoplastic chemotherapy: Secondary | ICD-10-CM | POA: Diagnosis not present

## 2021-12-16 MED ORDER — MAGNESIUM SULFATE 2 GM/50ML IV SOLN
2.0000 g | Freq: Once | INTRAVENOUS | Status: AC
  Administered 2021-12-16: 2 g via INTRAVENOUS
  Filled 2021-12-16: qty 50

## 2021-12-16 MED ORDER — SODIUM CHLORIDE 0.9 % IV SOLN
10.0000 mg | Freq: Once | INTRAVENOUS | Status: AC
  Administered 2021-12-16: 10 mg via INTRAVENOUS
  Filled 2021-12-16: qty 10

## 2021-12-16 MED ORDER — SODIUM CHLORIDE 0.9 % IV SOLN
40.0000 mg/m2 | Freq: Once | INTRAVENOUS | Status: AC
  Administered 2021-12-16: 74 mg via INTRAVENOUS
  Filled 2021-12-16: qty 74

## 2021-12-16 MED ORDER — SODIUM CHLORIDE 0.9% FLUSH
10.0000 mL | INTRAVENOUS | Status: DC | PRN
  Administered 2021-12-16: 10 mL

## 2021-12-16 MED ORDER — PALONOSETRON HCL INJECTION 0.25 MG/5ML
0.2500 mg | Freq: Once | INTRAVENOUS | Status: AC
  Administered 2021-12-16: 0.25 mg via INTRAVENOUS
  Filled 2021-12-16: qty 5

## 2021-12-16 MED ORDER — HEPARIN SOD (PORK) LOCK FLUSH 100 UNIT/ML IV SOLN
500.0000 [IU] | Freq: Once | INTRAVENOUS | Status: AC | PRN
  Administered 2021-12-16: 500 [IU]

## 2021-12-16 MED ORDER — SONAFINE EX EMUL
1.0000 "application " | Freq: Two times a day (BID) | CUTANEOUS | Status: DC
  Administered 2021-12-16: 1 via TOPICAL

## 2021-12-16 MED ORDER — POTASSIUM CHLORIDE IN NACL 20-0.9 MEQ/L-% IV SOLN
Freq: Once | INTRAVENOUS | Status: AC
  Filled 2021-12-16: qty 1000

## 2021-12-16 MED ORDER — SODIUM CHLORIDE 0.9 % IV SOLN: Freq: Once | INTRAVENOUS | Status: AC

## 2021-12-16 MED ORDER — SODIUM CHLORIDE 0.9 % IV SOLN
150.0000 mg | Freq: Once | INTRAVENOUS | Status: AC
  Administered 2021-12-16: 150 mg via INTRAVENOUS
  Filled 2021-12-16: qty 150

## 2021-12-16 NOTE — Patient Instructions (Signed)
[facility]ICAL [facility]COLOG[facility]  Discharge Instructions: ?Thank you for choosing [facility] to provide your oncology and hematology care.  ? ?If you have a lab appointment with the Cancer Center, please go directly to the Cancer Center and check in at the registration area. ?  ?Wear comfortable clothing and clothing appropriate for easy access to any Portacath or PICC line.  ? ?We strive to give you quality time with your provider. You may need to reschedule your appointment if you arrive late (15 or more minutes).  Arriving late affects you and other patients whose appointments are after yours.  Also, if you miss three or more appointments without notifying the office, you may be dismissed from the clinic at the provider?s discretion.    ?  ?For prescription refill requests, have your pharmacy contact our office and allow 72 hours for refills to be completed.   ? ?Today you received the following chemotherapy and/or immunotherapy agent: Cisplatin ?  ?To help prevent nausea and vomiting after your treatment, we encourage you to take your nausea medication as directed. ? ?BELOW ARE SYMPTOMS THAT SHOULD BE REPORTED IMMEDIATELY: ?*FEVER GREATER THAN 100.4 F (38 ?C) OR HIGHER ?*CHILLS OR SWEATING ?*NAUSEA AND VOMITING THAT IS NOT CONTROLLED WITH YOUR NAUSEA MEDICATION ?*UNUSUAL SHORTNESS OF BREATH ?*UNUSUAL BRUISING OR BLEEDING ?*URINARY PROBLEMS (pain or burning when urinating, or frequent urination) ?*BOWEL PROBLEMS (unusual diarrhea, constipation, pain near the anus) ?TENDERNESS IN MOUTH AND THROAT WITH OR WITHOUT PRESENCE OF ULCERS (sore throat, sores in mouth, or a toothache) ?UNUSUAL RASH, SWELLING OR PAIN  ?UNUSUAL VAGINAL DISCHARGE OR ITCHING  ? ?Items with * indicate a potential emergency and should be followed up as soon as possible or go to the Emergency Department if any problems should occur. ? ?Please show the CHEMOTHERAPY ALERT CARD or IMMUNOTHERAPY ALERT CARD at check-in to the  Emergency Department and triage nurse. ? ?Should you have questions after your visit or need to cancel or reschedule your appointment, please contact [facility]  Dept: [phone]  and follow the prompts.  Office hours are 8:00 a.m. to 4:30 p.m. Monday - Friday. Please note that voicemails left after 4:00 p.m. may not be returned until the following business day.  We are closed weekends and major holidays. You have access to a nurse at all times for urgent questions. Please call the main number to the clinic Dept: [phone] and follow the prompts. ? ? ?For any non-urgent questions, you may also contact your provider using MyChart. We now offer e-Visits for anyone 18 and older to request care online for non-urgent symptoms. For details visit [email or web address]. ?  ?Also download the MyChart app! Go to the app store, search "MyChart", open the app, select [facility], and log in with your MyChart username and password. ? ?Due to Covid, a mask is required upon entering the hospital/clinic. If you do not have a mask, one will be given to you upon arrival. For doctor visits, patients may have 1 support person aged 18 or older with them. For treatment visits, patients cannot have anyone with them due to current Covid guidelines and our immunocompromised population.  ? ?

## 2021-12-16 NOTE — Progress Notes (Signed)
Nutrition follow-up completed with patient during infusion for left tonsil cancer. ?Weight is stable and was documented as 163.19 pounds on March 15. ?Noted labs: Glucose 111, albumin 3.3, magnesium 1.5. ? ?Patient reports he definitely has pain with swallowing.  He is forcing himself to eat and drink as much as he can.  States he consumes 4-5 cartons of Ensure complete daily.  He is eating cereal, eggs, bacon, waffles, salmon, and soup.  Denies need for additional samples of supplements.  Reports he had nausea for the first time this morning. ? ?Nutrition diagnosis: Inadequate oral intake is stable. ? ?Intervention: ?Educated patient to increase Ensure complete to a minimum of 5 cartons daily and consume high-calorie, high-protein foods as tolerated. ?Suggested he try boost VHC and/or TwoCal HN for higher calories and protein in smaller volumes.  Provided information so he could order product. ?Take medications as prescribed. ? ?Monitoring, evaluation, goals: ?Continue to push oral intake with nutritional supplements for weight maintenance. ? ?Next visit: Thursday, March 23 during infusion. ? ?**Disclaimer: This note was dictated with voice recognition software. Similar sounding words can inadvertently be transcribed and this note may contain transcription errors which may not have been corrected upon publication of note.** ? ?

## 2021-12-17 ENCOUNTER — Ambulatory Visit
Admission: RE | Admit: 2021-12-17 | Discharge: 2021-12-17 | Disposition: A | Discharge status: Home / Self Care | Payer: No Typology Code available for payment source | Source: Ambulatory Visit | Attending: Radiation Oncology | Admitting: Radiation Oncology

## 2021-12-17 DIAGNOSIS — Z5111 Encounter for antineoplastic chemotherapy: Secondary | ICD-10-CM | POA: Diagnosis not present

## 2021-12-20 ENCOUNTER — Other Ambulatory Visit: Payer: Self-pay

## 2021-12-20 ENCOUNTER — Ambulatory Visit
Admission: RE | Admit: 2021-12-20 | Discharge: 2021-12-20 | Disposition: A | Discharge status: Home / Self Care | Payer: No Typology Code available for payment source | Source: Ambulatory Visit | Attending: Radiation Oncology | Admitting: Radiation Oncology

## 2021-12-20 DIAGNOSIS — Z5111 Encounter for antineoplastic chemotherapy: Secondary | ICD-10-CM | POA: Diagnosis not present

## 2021-12-21 ENCOUNTER — Ambulatory Visit
Admission: RE | Admit: 2021-12-21 | Discharge: 2021-12-21 | Disposition: A | Discharge status: Home / Self Care | Payer: No Typology Code available for payment source | Source: Ambulatory Visit | Attending: Radiation Oncology | Admitting: Radiation Oncology

## 2021-12-21 DIAGNOSIS — Z5111 Encounter for antineoplastic chemotherapy: Secondary | ICD-10-CM | POA: Diagnosis not present

## 2021-12-22 ENCOUNTER — Encounter: Payer: Self-pay | Admitting: Hematology and Oncology

## 2021-12-22 ENCOUNTER — Other Ambulatory Visit: Payer: Self-pay | Admitting: *Deleted

## 2021-12-22 ENCOUNTER — Other Ambulatory Visit: Payer: Self-pay

## 2021-12-22 ENCOUNTER — Ambulatory Visit
Admission: RE | Admit: 2021-12-22 | Discharge: 2021-12-22 | Disposition: A | Discharge status: Home / Self Care | Payer: No Typology Code available for payment source | Source: Ambulatory Visit | Attending: Radiation Oncology | Admitting: Radiation Oncology

## 2021-12-22 ENCOUNTER — Inpatient Hospital Stay (HOSPITAL_BASED_OUTPATIENT_CLINIC_OR_DEPARTMENT_OTHER): Payer: No Typology Code available for payment source | Admitting: Hematology and Oncology

## 2021-12-22 ENCOUNTER — Inpatient Hospital Stay: Payer: No Typology Code available for payment source

## 2021-12-22 DIAGNOSIS — C099 Malignant neoplasm of tonsil, unspecified: Secondary | ICD-10-CM

## 2021-12-22 DIAGNOSIS — G893 Neoplasm related pain (acute) (chronic): Secondary | ICD-10-CM | POA: Diagnosis not present

## 2021-12-22 DIAGNOSIS — Z95828 Presence of other vascular implants and grafts: Secondary | ICD-10-CM

## 2021-12-22 DIAGNOSIS — Z5111 Encounter for antineoplastic chemotherapy: Secondary | ICD-10-CM | POA: Diagnosis not present

## 2021-12-22 DIAGNOSIS — D696 Thrombocytopenia, unspecified: Secondary | ICD-10-CM | POA: Diagnosis not present

## 2021-12-22 DIAGNOSIS — K1231 Oral mucositis (ulcerative) due to antineoplastic therapy: Secondary | ICD-10-CM | POA: Diagnosis not present

## 2021-12-22 LAB — CBC WITH DIFFERENTIAL (CANCER CENTER ONLY)
Abs Immature Granulocytes: 0.01 10*3/uL (ref 0.00–0.07)
Basophils Absolute: 0 10*3/uL (ref 0.0–0.1)
Basophils Relative: 0 %
Eosinophils Absolute: 0.1 10*3/uL (ref 0.0–0.5)
Eosinophils Relative: 2 %
HCT: 31.7 % — ABNORMAL LOW (ref 39.0–52.0)
Hemoglobin: 11.3 g/dL — ABNORMAL LOW (ref 13.0–17.0)
Immature Granulocytes: 0 %
Lymphocytes Relative: 17 %
Lymphs Abs: 0.5 10*3/uL — ABNORMAL LOW (ref 0.7–4.0)
MCH: 33.9 pg (ref 26.0–34.0)
MCHC: 35.6 g/dL (ref 30.0–36.0)
MCV: 95.2 fL (ref 80.0–100.0)
Monocytes Absolute: 0.3 10*3/uL (ref 0.1–1.0)
Monocytes Relative: 9 %
Neutro Abs: 2.1 10*3/uL (ref 1.7–7.7)
Neutrophils Relative %: 72 %
Platelet Count: 68 10*3/uL — ABNORMAL LOW (ref 150–400)
RBC: 3.33 MIL/uL — ABNORMAL LOW (ref 4.22–5.81)
RDW: 11.2 % — ABNORMAL LOW (ref 11.5–15.5)
WBC Count: 3 10*3/uL — ABNORMAL LOW (ref 4.0–10.5)
nRBC: 0 % (ref 0.0–0.2)

## 2021-12-22 LAB — COMPREHENSIVE METABOLIC PANEL
ALT: 23 U/L (ref 0–44)
AST: 22 U/L (ref 15–41)
Albumin: 3.5 g/dL (ref 3.5–5.0)
Alkaline Phosphatase: 73 U/L (ref 38–126)
Anion gap: 4 — ABNORMAL LOW (ref 5–15)
BUN: 14 mg/dL (ref 8–23)
CO2: 29 mmol/L (ref 22–32)
Calcium: 8.9 mg/dL (ref 8.9–10.3)
Chloride: 103 mmol/L (ref 98–111)
Creatinine, Ser: 0.58 mg/dL — ABNORMAL LOW (ref 0.61–1.24)
GFR, Estimated: >60 mL/min (ref >60)
Glucose, Bld: 143 mg/dL — ABNORMAL HIGH (ref 70–99)
Potassium: 4.1 mmol/L (ref 3.5–5.1)
Sodium: 136 mmol/L (ref 135–145)
Total Bilirubin: 0.3 mg/dL (ref 0.3–1.2)
Total Protein: 5.9 g/dL — ABNORMAL LOW (ref 6.5–8.1)

## 2021-12-22 LAB — MAGNESIUM: Magnesium: 1.6 mg/dL — ABNORMAL LOW (ref 1.7–2.4)

## 2021-12-22 MED ORDER — HEPARIN SOD (PORK) LOCK FLUSH 100 UNIT/ML IV SOLN: 500.0000 [IU] | INTRAVENOUS | Status: DC | PRN

## 2021-12-22 MED ORDER — HEPARIN SOD (PORK) LOCK FLUSH 100 UNIT/ML IV SOLN
500.0000 [IU] | INTRAVENOUS | Status: AC | PRN
  Administered 2021-12-22: 500 [IU]

## 2021-12-22 MED ORDER — SODIUM CHLORIDE 0.9% FLUSH
10.0000 mL | INTRAVENOUS | Status: AC | PRN
  Administered 2021-12-22: 10 mL

## 2021-12-22 MED ORDER — SODIUM CHLORIDE 0.9% FLUSH: 10.0000 mL | INTRAVENOUS | Status: DC | PRN

## 2021-12-22 NOTE — Progress Notes (Signed)
This RN spoke with pt per need for IV replacement for magnesium. ? ? ?

## 2021-12-22 NOTE — Assessment & Plan Note (Signed)
Thrombocytopenia related to cisplatin.  Platelet count today 68,000.  No concern for bleeding.  We will unfortunately have to hold chemotherapy plan for tomorrow. ?

## 2021-12-22 NOTE — Assessment & Plan Note (Signed)
This is a very pleasant 66 year old male patient, current everyday smoker with newly diagnosed squamous cell carcinoma of the left tonsil, p16 negative, staging T1 versus T2, borderline size, and N2 with bilateral cervical lymphadenopathy which are hypermetabolic on PET imaging and M0 with no evidence of distant metastasis referred to medical oncology for consideration of concurrent chemotherapy and radiation.   ?Given bilateral cervical lymphadenopathy, we have discussed about considering concurrent chemotherapy and radiation as frontline treatment.  ?He went to [facility] for a second opinion about role of surgery. He decided to proceed with CRT locally after talking to surgery team. ? ?Cycle 1 day 1 of chemotherapy started on 11/24/2021 ?Cycle 2-day 1 on 12/02/2021 ?Cycle 3-day 1 on 12/09/2021 ?Cycle 4-day 1 12/16/2021 ? ?He is here for follow-up before cycle 5-day 1.  He has noticed that the sore throat has considerably increased since his last visit, he is now taking pain medicine about 3-4 times a day.  Besides pain he has also noticed a couple episodes of nausea and vomiting.  He is still able to try and eat most of the food since he refused the G-tube. ?His labs today show thrombocytopenia with platelet count of 68,000.  Unfortunately we cannot proceed with chemotherapy as planned tomorrow.  Will omit cycle 5 and will want to cycle 6 which will be done on December 30, 2021. ?

## 2021-12-22 NOTE — Assessment & Plan Note (Signed)
He is currently using oxycodone for management of pain. ?He needs about 3 to 4 pills a day. ?

## 2021-12-22 NOTE — Assessment & Plan Note (Signed)
Again continues on pain medication and viscous lidocaine.  Noted severe erythema on exam today.  We will continue to monitor ?

## 2021-12-22 NOTE — Progress Notes (Signed)
[facility] up: ?  ? ?[name] Sites, MD ?[address] ?[place] [address] ? ? 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 ?Squamous cell carcinoma of left tonsil (HCC) ?Staging form: Pharynx - P16 Negative Oropharynx, AJCC 8th Edition ?- Clinical stage from 10/26/2021: Stage IVA (cT2, cN2c, cM0, p16-) - Signed by Eppie Gibson, MD on 10/26/2021 ?Stage prefix: Initial diagnosis ? ? ?SUMMARY OF ONCOLOGIC HISTORY: ?Oncology History  ?Squamous cell carcinoma of left tonsil ([place])  ?10/25/2021 Initial Diagnosis  ? Squamous cell carcinoma of left tonsil ([place]) ?  ?10/26/2021 Cancer Staging  ? Staging form: Pharynx - P16 Negative Oropharynx, AJCC 8th Edition ?- Clinical stage from 10/26/2021: Stage IVA (cT2, cN2c, cM0, p16-) - Signed by Eppie Gibson, MD on 10/26/2021 ?Stage prefix: Initial diagnosis ? ?  ?11/25/2021 -  Chemotherapy  ? Patient is on Treatment Plan : HEAD/NECK Cisplatin q7d  ?   ?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 ([place])  ?10/26/2021 Initial Diagnosis  ? 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 ([place]) ?  ?10/26/2021 Cancer Staging  ? Staging form: Pharynx - P16 Negative Oropharynx, AJCC 8th Edition ?- Clinical stage from 10/26/2021: Stage IVA (cT2, cN2c, cM0, p16-) - Signed by Eppie Gibson, MD on 10/26/2021 ?Stage prefix: Initial diagnosis ? ?  ? ? ?CURRENT THERAPY: concurrent chemoradiation ? ?INTERVAL HISTORY: ? ?[name] [name] 66 y.o. 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  He began chemotherapy with weekly cisplatin on November 25, 2021. ? ?He has received 4 weekly cycles of cisplatin so far.  Since his last visit, he has noticed a couple  episodes of nausea and 1 episode of vomiting.  He also notices that the throat pain is very intense that he has to take about 3 to 4 pills a day.  He was hoping he can get some IV morphine with his chemotherapy.  He denies any bleeding issues.  He denies any changes in breathing.  He has to make some changes to his chemotherapy date next week because of a court issue. ?Rest of the pertinent 10  ROS reviewed and negative ? ?Patient Active Problem List  ? Diagnosis Date Noted  ? Thrombocytopenia ([place]) 12/22/2021  ? Mucositis due to antineoplastic therapy 12/15/2021  ? Intractable hiccups 12/01/2021  ? Chemotherapy induced nausea and vomiting 12/01/2021  ? Cancer related pain 11/16/2021  ? Encounter for preoperative dental examination 10/29/2021  ? Caries 10/29/2021  ? Teeth missing 10/29/2021  ? Chronic periodontitis 10/29/2021  ? Accretions on teeth 10/29/2021  ? 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 Excessive dental attrition 10/29/2021  ? Gingival recession, generalized 10/29/2021  ? Phobia of dental procedure 10/29/2021  ? 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 ([place]) 10/26/2021  ? Squamous cell carcinoma of left tonsil ([place]) 10/25/2021  ? Avascular necrosis of hip, right ([place]) 05/03/2018  ? Tobacco abuse 12/12/2011  ? Cellulitis of second finger of right hand 05/18/2011  ? Cellulitis and abscess of finger 05/18/2011  ? Cellulitis of hand 05/18/2011  ? Cellulitis of index finger 05/18/2011  ? Snake bite poisoning 05/17/2011  ? Swelling of second finger of right hand 05/17/2011  ? Pain in finger of right hand 05/17/2011  ?  HTN (hypertension) 05/17/2011  ? Hyperlipidemia 05/17/2011  ? FATTY LIVER DISEASE 12/04/2008  ? ABDOMINAL PAIN-RUQ 12/04/2008  ? ? ?is allergic to codeine. ? ?MEDICAL HISTORY: ?Past Medical History:  ?Diagnosis Date  ? Acute kidney failure ([place]) 04/2018  ? CKD (chronic kidney disease), stage II   ? Complication of anesthesia   ? DDD (degenerative disc disease), lumbar   ? History of  pancreatitis   ? 20-30 years ago  ? History of venomous snake bite   ? rt index finger-  ? Hypercholesterolemia   ? Hypertension   ? Nerve pain   ? Right leg, resolved with stimulator  ? PONV (postoperative nausea and vomiting)   ? S/P insertion of spinal cord stimulator 08/2017  ? Spinal stenosis   ? Tobacco abuse   ? ? ?SURGICAL HISTORY: ?Past Surgical History:  ?Procedure Laterality Date  ? COLONOSCOPY    ? FINGER EXPLORATION  05/2011  ? rt index finger snakebitex5  ? HERNIA REPAIR    ? as a child  ? I & D EXTREMITY  11/24/2011  ? Procedure: IRRIGATION AND DEBRIDEMENT EXTREMITY;  Surgeon: Tennis Must, MD;  Location: [facility];  Service: Orthopedics;  Laterality: Right;  right index  ? IR IMAGING GUIDED PORT INSERTION  11/17/2021  ? IR US GUIDE VASC ACCESS RIGHT  11/17/2021  ? MASS EXCISION  01/23/2012  ? Procedure: EXCISION MASS;  Surgeon: Tennis Must, MD;  Location: [facility];  Service: Orthopedics;  Laterality: Right;  Right index a-cell graft  ? TOTAL HIP ARTHROPLASTY Right 05/03/2018  ? Procedure: RIGHT TOTAL HIP ARTHROPLASTY ANTERIOR APPROACH;  Surgeon: Rod Can, MD;  Location: [facility];  Service: Orthopedics;  Laterality: Right;  Needs RNFA  ? ? ?SOCIAL HISTORY: ?Social History  ? ?Socioeconomic History  ? Marital status: Married  ?  Spouse name: Not on file  ? Number of children: Not on file  ? Years of education: Not on file  ? Highest education level: Not on file  ?Occupational History  ? Not on file  ?Tobacco Use  ? Smoking status: Every Day  ?  Packs/day: 0.50  ?  Years: 20.00  ?  Pack years: 10.00  ?  Types: Cigarettes  ? Smokeless tobacco: Never  ?Vaping Use  ? Vaping Use: Never used  ?Substance and Sexual Activity  ? Alcohol use: Yes  ?  Comment: occasional  ? Drug use: No  ? Sexual activity: Not Currently  ?Other Topics Concern  ? Not on file  ?Social History Narrative  ? Not on file  ? ?Social Determinants of Health  ? ?Financial Resource Strain: Low Risk   ?  Difficulty of Paying Living Expenses: Not hard at all  ?Food Insecurity: No Food Insecurity  ? Worried About Charity fundraiser in the Last Year: Never true  ? Ran Out of Food in the Last Year: Never true  ?Transportation Needs: No Transportation Needs  ? Lack of Transportation (Medical): No  ? Lack of Transportation (Non-Medical): No  ?Physical Activity: Not on file  ?Stress: Not on file  ?Social Connections: Not on file  ?Intimate Partner Violence: Not on file  ? ? ?FAMILY HISTORY: ?Family History  ?Problem Relation Age of Onset  ? Diabetes Mother   ? Throat cancer Brother   ? ? ?Review of Systems  ?Constitutional:  Positive for fatigue. Negative for appetite change, chills, fever and unexpected weight change.  ?HENT:   Positive for sore throat. Negative  for hearing loss, lump/mass, mouth sores and trouble swallowing.   ?Eyes:  Negative for eye problems and icterus.  ?Respiratory:  Negative for chest tightness, cough and shortness of breath.   ?Cardiovascular:  Negative for chest pain, leg swelling and palpitations.  ?Gastrointestinal:  Positive for nausea and vomiting. Negative for abdominal distention, abdominal pain, constipation and diarrhea.  ?Endocrine: Negative for hot flashes.  ?Genitourinary:  Negative for difficulty urinating.   ?Musculoskeletal:  Negative for arthralgias.  ?Skin:  Negative for itching and rash.  ?Neurological:  Negative for dizziness, extremity weakness, headaches and numbness.  ?Hematological:  Negative for adenopathy. Does not bruise/bleed easily.  ?Psychiatric/Behavioral:  Negative for depression. The patient is not nervous/anxious.    ? ? ?PHYSICAL EXAMINATION ? ?ECOG PERFORMANCE STATUS: 1 - Symptomatic but completely ambulatory ? ?Vitals:  ? 12/22/21 0901  ?BP: 109/74  ?Pulse: 73  ?Resp: 16  ?Temp: 97.7 ?F (36.5 ?C)  ?SpO2: 100%  ? ? ?Physical Exam ?Constitutional:   ?   General: He is not in acute distress. ?   Appearance: Normal appearance. He is not toxic-appearing.  ?HENT:  ?    Head: Normocephalic and atraumatic.  ?   Mouth/Throat:  ?   Mouth: Mucous membranes are moist.  ?   Pharynx: Posterior oropharyngeal erythema (Erythema posterior oropharynx without any ulceration) present. No oro

## 2021-12-23 ENCOUNTER — Inpatient Hospital Stay: Payer: No Typology Code available for payment source

## 2021-12-23 ENCOUNTER — Ambulatory Visit
Admission: RE | Admit: 2021-12-23 | Discharge: 2021-12-23 | Disposition: A | Discharge status: Home / Self Care | Payer: No Typology Code available for payment source | Source: Ambulatory Visit | Attending: Radiation Oncology | Admitting: Radiation Oncology

## 2021-12-23 ENCOUNTER — Inpatient Hospital Stay: Payer: No Typology Code available for payment source | Admitting: Dietician

## 2021-12-23 DIAGNOSIS — Z5111 Encounter for antineoplastic chemotherapy: Secondary | ICD-10-CM | POA: Diagnosis not present

## 2021-12-24 ENCOUNTER — Ambulatory Visit
Admission: RE | Admit: 2021-12-24 | Discharge: 2021-12-24 | Disposition: A | Discharge status: Home / Self Care | Payer: No Typology Code available for payment source | Source: Ambulatory Visit | Attending: Radiation Oncology | Admitting: Radiation Oncology

## 2021-12-24 DIAGNOSIS — Z5111 Encounter for antineoplastic chemotherapy: Secondary | ICD-10-CM | POA: Diagnosis not present

## 2021-12-27 ENCOUNTER — Ambulatory Visit
Admission: RE | Admit: 2021-12-27 | Discharge: 2021-12-27 | Disposition: A | Discharge status: Home / Self Care | Payer: No Typology Code available for payment source | Source: Ambulatory Visit | Attending: Radiation Oncology | Admitting: Radiation Oncology

## 2021-12-27 DIAGNOSIS — Z5111 Encounter for antineoplastic chemotherapy: Secondary | ICD-10-CM | POA: Diagnosis not present

## 2021-12-28 ENCOUNTER — Other Ambulatory Visit: Payer: Self-pay

## 2021-12-28 ENCOUNTER — Ambulatory Visit
Admission: RE | Admit: 2021-12-28 | Discharge: 2021-12-28 | Disposition: A | Discharge status: Home / Self Care | Payer: No Typology Code available for payment source | Source: Ambulatory Visit | Attending: Radiation Oncology | Admitting: Radiation Oncology

## 2021-12-28 DIAGNOSIS — Z5111 Encounter for antineoplastic chemotherapy: Secondary | ICD-10-CM | POA: Diagnosis not present

## 2021-12-29 ENCOUNTER — Other Ambulatory Visit: Payer: Self-pay | Admitting: Hematology and Oncology

## 2021-12-29 ENCOUNTER — Ambulatory Visit
Admission: RE | Admit: 2021-12-29 | Discharge: 2021-12-29 | Disposition: A | Discharge status: Home / Self Care | Payer: No Typology Code available for payment source | Source: Ambulatory Visit | Attending: Radiation Oncology | Admitting: Radiation Oncology

## 2021-12-29 ENCOUNTER — Inpatient Hospital Stay: Payer: No Typology Code available for payment source

## 2021-12-29 ENCOUNTER — Inpatient Hospital Stay: Payer: No Typology Code available for payment source | Admitting: Adult Health

## 2021-12-29 DIAGNOSIS — Z95828 Presence of other vascular implants and grafts: Secondary | ICD-10-CM

## 2021-12-29 DIAGNOSIS — C099 Malignant neoplasm of tonsil, unspecified: Secondary | ICD-10-CM

## 2021-12-29 DIAGNOSIS — Z5111 Encounter for antineoplastic chemotherapy: Secondary | ICD-10-CM | POA: Diagnosis not present

## 2021-12-29 LAB — CBC WITH DIFFERENTIAL (CANCER CENTER ONLY)
Abs Immature Granulocytes: 0.01 10*3/uL (ref 0.00–0.07)
Basophils Absolute: 0 10*3/uL (ref 0.0–0.1)
Basophils Relative: 1 %
Eosinophils Absolute: 0.1 10*3/uL (ref 0.0–0.5)
Eosinophils Relative: 2 %
HCT: 31.8 % — ABNORMAL LOW (ref 39.0–52.0)
Hemoglobin: 11.4 g/dL — ABNORMAL LOW (ref 13.0–17.0)
Immature Granulocytes: 1 %
Lymphocytes Relative: 19 %
Lymphs Abs: 0.4 10*3/uL — ABNORMAL LOW (ref 0.7–4.0)
MCH: 33.9 pg (ref 26.0–34.0)
MCHC: 35.8 g/dL (ref 30.0–36.0)
MCV: 94.6 fL (ref 80.0–100.0)
Monocytes Absolute: 0.6 10*3/uL (ref 0.1–1.0)
Monocytes Relative: 26 %
Neutro Abs: 1.1 10*3/uL — ABNORMAL LOW (ref 1.7–7.7)
Neutrophils Relative %: 51 %
Platelet Count: 101 10*3/uL — ABNORMAL LOW (ref 150–400)
RBC: 3.36 MIL/uL — ABNORMAL LOW (ref 4.22–5.81)
RDW: 11.9 % (ref 11.5–15.5)
WBC Count: 2.2 10*3/uL — ABNORMAL LOW (ref 4.0–10.5)
nRBC: 0 % (ref 0.0–0.2)

## 2021-12-29 LAB — COMPREHENSIVE METABOLIC PANEL
ALT: 15 U/L (ref 0–44)
AST: 21 U/L (ref 15–41)
Albumin: 3.5 g/dL (ref 3.5–5.0)
Alkaline Phosphatase: 73 U/L (ref 38–126)
Anion gap: 5 (ref 5–15)
BUN: 23 mg/dL (ref 8–23)
CO2: 29 mmol/L (ref 22–32)
Calcium: 8.9 mg/dL (ref 8.9–10.3)
Chloride: 103 mmol/L (ref 98–111)
Creatinine, Ser: 0.66 mg/dL (ref 0.61–1.24)
GFR, Estimated: >60 mL/min (ref >60)
Glucose, Bld: 124 mg/dL — ABNORMAL HIGH (ref 70–99)
Potassium: 4 mmol/L (ref 3.5–5.1)
Sodium: 137 mmol/L (ref 135–145)
Total Bilirubin: 0.4 mg/dL (ref 0.3–1.2)
Total Protein: 6.2 g/dL — ABNORMAL LOW (ref 6.5–8.1)

## 2021-12-29 LAB — MAGNESIUM: Magnesium: 1.8 mg/dL (ref 1.7–2.4)

## 2021-12-29 MED ORDER — HEPARIN SOD (PORK) LOCK FLUSH 100 UNIT/ML IV SOLN
500.0000 [IU] | INTRAVENOUS | Status: AC | PRN
  Administered 2021-12-29: 500 [IU]

## 2021-12-29 MED ORDER — SODIUM CHLORIDE 0.9% FLUSH
10.0000 mL | INTRAVENOUS | Status: AC | PRN
  Administered 2021-12-29: 10 mL

## 2021-12-30 ENCOUNTER — Inpatient Hospital Stay: Payer: No Typology Code available for payment source | Admitting: Nutrition

## 2021-12-30 ENCOUNTER — Ambulatory Visit
Admission: RE | Admit: 2021-12-30 | Discharge: 2021-12-30 | Disposition: A | Discharge status: Home / Self Care | Payer: No Typology Code available for payment source | Source: Ambulatory Visit | Attending: Radiation Oncology | Admitting: Radiation Oncology

## 2021-12-30 ENCOUNTER — Encounter: Payer: Self-pay | Admitting: Hematology and Oncology

## 2021-12-30 ENCOUNTER — Inpatient Hospital Stay (HOSPITAL_BASED_OUTPATIENT_CLINIC_OR_DEPARTMENT_OTHER): Payer: No Typology Code available for payment source | Admitting: Hematology and Oncology

## 2021-12-30 ENCOUNTER — Inpatient Hospital Stay: Payer: No Typology Code available for payment source

## 2021-12-30 VITALS — BP 95/67 | HR 65 | Temp 99.0°F | Resp 16 | Wt 156.9 lb

## 2021-12-30 DIAGNOSIS — R112 Nausea with vomiting, unspecified: Secondary | ICD-10-CM

## 2021-12-30 DIAGNOSIS — K1231 Oral mucositis (ulcerative) due to antineoplastic therapy: Secondary | ICD-10-CM

## 2021-12-30 DIAGNOSIS — C099 Malignant neoplasm of tonsil, unspecified: Secondary | ICD-10-CM

## 2021-12-30 DIAGNOSIS — Z5111 Encounter for antineoplastic chemotherapy: Secondary | ICD-10-CM | POA: Diagnosis not present

## 2021-12-30 DIAGNOSIS — R634 Abnormal weight loss: Secondary | ICD-10-CM | POA: Diagnosis not present

## 2021-12-30 DIAGNOSIS — T451X5A Adverse effect of antineoplastic and immunosuppressive drugs, initial encounter: Secondary | ICD-10-CM

## 2021-12-30 MED ORDER — OXYCODONE HCL 5 MG PO TABS: 5.0000 mg | ORAL_TABLET | ORAL | 0 refills | Status: DC | PRN

## 2021-12-30 MED ORDER — SODIUM CHLORIDE 0.9 % IV SOLN
40.0000 mg/m2 | Freq: Once | INTRAVENOUS | Status: AC
  Administered 2021-12-30: 74 mg via INTRAVENOUS
  Filled 2021-12-30: qty 74

## 2021-12-30 MED ORDER — SODIUM CHLORIDE 0.9 % IV SOLN
10.0000 mg | Freq: Once | INTRAVENOUS | Status: AC
  Administered 2021-12-30: 10 mg via INTRAVENOUS
  Filled 2021-12-30: qty 10

## 2021-12-30 MED ORDER — SODIUM CHLORIDE 0.9% FLUSH
10.0000 mL | INTRAVENOUS | Status: DC | PRN
  Administered 2021-12-30: 10 mL

## 2021-12-30 MED ORDER — SODIUM CHLORIDE 0.9 % IV SOLN
150.0000 mg | Freq: Once | INTRAVENOUS | Status: AC
  Administered 2021-12-30: 150 mg via INTRAVENOUS
  Filled 2021-12-30: qty 150

## 2021-12-30 MED ORDER — MAGNESIUM SULFATE 2 GM/50ML IV SOLN
2.0000 g | Freq: Once | INTRAVENOUS | Status: AC
  Administered 2021-12-30: 2 g via INTRAVENOUS
  Filled 2021-12-30: qty 50

## 2021-12-30 MED ORDER — HEPARIN SOD (PORK) LOCK FLUSH 100 UNIT/ML IV SOLN
500.0000 [IU] | Freq: Once | INTRAVENOUS | Status: AC | PRN
  Administered 2021-12-30: 500 [IU]

## 2021-12-30 MED ORDER — POTASSIUM CHLORIDE IN NACL 20-0.9 MEQ/L-% IV SOLN
Freq: Once | INTRAVENOUS | Status: AC
  Filled 2021-12-30: qty 1000

## 2021-12-30 MED ORDER — PALONOSETRON HCL INJECTION 0.25 MG/5ML
0.2500 mg | Freq: Once | INTRAVENOUS | Status: AC
  Administered 2021-12-30: 0.25 mg via INTRAVENOUS
  Filled 2021-12-30: qty 5

## 2021-12-30 MED ORDER — SODIUM CHLORIDE 0.9 % IV SOLN: INTRAVENOUS | Status: DC

## 2021-12-30 MED ORDER — MORPHINE SULFATE (PF) 2 MG/ML IV SOLN
2.0000 mg | Freq: Once | INTRAVENOUS | Status: AC
  Administered 2021-12-30: 2 mg via INTRAVENOUS
  Filled 2021-12-30: qty 1

## 2021-12-30 NOTE — Patient Instructions (Signed)
[facility] MEDICAL ONCOLOGY  Discharge Instructions: ?Thank you for choosing [facility] to provide your oncology and hematology care.  ? ?If you have a lab appointment with the Cancer Center, please go directly to the Cancer Center and check in at the registration area. ?  ?Wear comfortable clothing and clothing appropriate for easy access to any Portacath or PICC line.  ? ?We strive to give you quality time with your provider. You may need to reschedule your appointment if you arrive late (15 or more minutes).  Arriving late affects you and other patients whose appointments are after yours.  Also, if you miss three or more appointments without notifying the office, you may be dismissed from the clinic at the provider?s discretion.    ?  ?For prescription refill requests, have your pharmacy contact our office and allow 72 hours for refills to be completed.   ? ?Today you received the following chemotherapy and/or immunotherapy agent: Cisplatin ?  ?To help prevent nausea and vomiting after your treatment, we encourage you to take your nausea medication as directed. ? ?BELOW ARE SYMPTOMS THAT SHOULD BE REPORTED IMMEDIATELY: ?*FEVER GREATER THAN 100.4 F (38 ?C) OR HIGHER ?*CHILLS OR SWEATING ?*NAUSEA AND VOMITING THAT IS NOT CONTROLLED WITH YOUR NAUSEA MEDICATION ?*UNUSUAL SHORTNESS OF BREATH ?*UNUSUAL BRUISING OR BLEEDING ?*URINARY PROBLEMS (pain or burning when urinating, or frequent urination) ?*BOWEL PROBLEMS (unusual diarrhea, constipation, pain near the anus) ?TENDERNESS IN MOUTH AND THROAT WITH OR WITHOUT PRESENCE OF ULCERS (sore throat, sores in mouth, or a toothache) ?UNUSUAL RASH, SWELLING OR PAIN  ?UNUSUAL VAGINAL DISCHARGE OR ITCHING  ? ?Items with * indicate a potential emergency and should be followed up as soon as possible or go to the Emergency Department if any problems should occur. ? ?Please show the CHEMOTHERAPY ALERT CARD or IMMUNOTHERAPY ALERT CARD at check-in to the  Emergency Department and triage nurse. ? ?Should you have questions after your visit or need to cancel or reschedule your appointment, please contact [facility]ICAL ONCOL[facility]  Dept: [phone]  and follow the prompts.  Office hours are 8:00 a.m. to 4:30 p.m. Monday - Friday. Please note that voicemails left after 4:00 p.m. may not be returned until the following business day.  We are closed weekends and major holidays. You have access to a nurse at all times for urgent questions. Please call the main number to the clinic Dept: [phone] and follow the prompts. ? ? ?For any non-urgent questions, you may also contact your provider using MyChart. We now offer e-Visits for anyone 18 and older to request care online for non-urgent symptoms. For details visit [email or web address]. ?  ?Also download the MyChart app! Go to the app store, search "MyChart", open the app, select , and log in with your MyChart username and password. ? ?Due to Covid, a mask is required upon entering the hospital/clinic. If you do not have a mask, one will be given to you upon arrival. For doctor visits, patients may have 1 support person aged 18 or older with them. For treatment visits, patients cannot have anyone with them due to current Covid guidelines and our immunocompromised population.  ? ?

## 2021-12-30 NOTE — Progress Notes (Signed)
Due to temp of 99 and ANC of 1.1- Dr. Chryl Heck ordered 1L of fluids. ? ?Ok to proceed with treatment with ANC of 1.1 today per Dr. Chryl Heck ? ?Ok to run post hydration with cisplatin per Dr. Chryl Heck. Patient has radiation at 3 pm today. ?

## 2021-12-30 NOTE — Assessment & Plan Note (Signed)
Pain from the mucositis appears to be the most bothersome complaint today.  I have increased his oxycodone to 1 to 2 tablets every 4 hours as needed for pain control.  New prescription has been dispensed. ?

## 2021-12-30 NOTE — Assessment & Plan Note (Signed)
Weight loss from poor p.o. intake given dysphagia and odynophagia.  He refused G-tube.  Encouraged him to try increased dose of pain medicine and continue to eat small meals multiple times.  We will continue to monitor his weight. ?

## 2021-12-30 NOTE — Assessment & Plan Note (Signed)
This is a very pleasant 66 year old male patient, current everyday smoker with newly diagnosed squamous cell carcinoma of the left tonsil, p16 negative, staging T1 versus T2, borderline size, and N2 with bilateral cervical lymphadenopathy which are hypermetabolic on PET imaging and M0 with no evidence of distant metastasis referred to medical oncology for consideration of concurrent chemotherapy and radiation.   ?Given bilateral cervical lymphadenopathy, we have discussed about considering concurrent chemotherapy and radiation as frontline treatment.  ?He went to [facility] for a second opinion about role of surgery. He decided to proceed with CRT locally after talking to surgery team. ? ?Cycle 1 day 1 of chemotherapy started on 11/24/2021 ?Cycle 2-day 1 on 12/02/2021 ?Cycle 3-day 1 on 12/09/2021 ?Cycle 4-day 1 12/16/2021 ?Cycle 5-day 1 delayed by 1 week given thrombocytopenia. ? ?He is here for follow-up.  He continues to feel well except for sore throat.  He did lose about 7 to 8 pounds from the sore throat in the past 2 weeks.  He denies any fevers or chills or change in breathing, diarrhea or dysuria.  Port site does not bother him.  He only complains of sore throat and he has been able to take pain medicine every 4 hours for pain control. ? ?We have discussed that his [place] is not 1500 which is an ideal number to proceed with cisplatin however since we have already delayed the last cycle of chemotherapy and since there is no concern for active infection, we will proceed with treatment as planned.  He understands that he may not be able to receive the next dose of cisplatin and he understands that he is at very high risk for infections.  Neutropenic precautions advised. ? ?He will return to clinic in 1 week as planned. ?

## 2021-12-30 NOTE — Assessment & Plan Note (Signed)
Continue Zofran, Compazine and dexamethasone as instructed ?

## 2021-12-30 NOTE — Progress Notes (Signed)
[name]: ?  ? ?[name], MD ?[address] ?[place] [address] ? ? 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 ?Squamous cell carcinoma of left tonsil (HCC) ?Staging form: Pharynx - P16 Negative Oropharynx, AJCC 8th Edition ?- Clinical stage from 10/26/2021: Stage IVA (cT2, cN2c, cM0, p16-) - Signed by Eppie Gibson, MD on 10/26/2021 ?Stage prefix: Initial diagnosis ? ? ?SUMMARY OF ONCOLOGIC HISTORY: ?Oncology History  ?Squamous cell carcinoma of left tonsil ([place])  ?10/25/2021 Initial Diagnosis  ? Squamous cell carcinoma of left tonsil ([place]) ?  ?10/26/2021 Cancer Staging  ? Staging form: Pharynx - P16 Negative Oropharynx, AJCC 8th Edition ?- Clinical stage from 10/26/2021: Stage IVA (cT2, cN2c, cM0, p16-) - Signed by Eppie Gibson, MD on 10/26/2021 ?Stage prefix: Initial diagnosis ? ?  ?11/25/2021 -  Chemotherapy  ? Patient is on Treatment Plan : HEAD/NECK Cisplatin q7d  ?   ?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 ([place])  ?10/26/2021 Initial Diagnosis  ? 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 ([place]) ?  ?10/26/2021 Cancer Staging  ? Staging form: Pharynx - P16 Negative Oropharynx, AJCC 8th Edition ?- Clinical stage from 10/26/2021: Stage IVA (cT2, cN2c, cM0, p16-) - Signed by Eppie Gibson, MD on 10/26/2021 ?Stage prefix: Initial diagnosis ? ?  ? ?CURRENT THERAPY: concurrent chemoradiation ? ?INTERVAL HISTORY: ? ?[name] [name] 66 y.o. 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  He began chemotherapy with weekly cisplatin on November 25, 2021. ? ?He has received 4 weekly cycles of cisplatin so far.  He missed his last cycle of chemotherapy because of  thrombocytopenia. ?He is now here for cycle 5-day 1 of chemotherapy.  Since his last visit, he continues to have a lot of sore throat, he has been taking pain medication pretty much every 4 hours.  He says he mostly cannot eat because it is so difficult to swallow.  He refused G-tube in the past.  He states he only has 9 more days of radiation and he hopes that once radiation is done and the sore throat gets better.  He denies any side effects from chemotherapy.  No neuropathy or hearing changes.  No fevers or chills.  No change in breathing or bowel habits or urinary habits. ?Rest of the pertinent 10  ROS reviewed and negative ? ?Patient Active Problem List  ? Diagnosis Date Noted  ? Weight loss, unintentional 12/30/2021  ? Thrombocytopenia ([place]) 12/22/2021  ? Hypomagnesemia 12/22/2021  ? Mucositis due to antineoplastic therapy 12/15/2021  ? Intractable hiccups 12/01/2021  ? Chemotherapy induced nausea and vomiting 12/01/2021  ? Cancer related pain 11/16/2021  ? Encounter for preoperative dental examination 10/29/2021  ? Caries 10/29/2021  ? Teeth missing 10/29/2021  ? Chronic periodontitis 10/29/2021  ? Accretions on teeth 10/29/2021  ? 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 Excessive dental attrition 10/29/2021  ? Gingival recession, generalized 10/29/2021  ? Phobia of dental procedure 10/29/2021  ? 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 ([place]) 10/26/2021  ? Squamous cell carcinoma of left tonsil ([place]) 10/25/2021  ? Avascular necrosis of hip, right ([place]) 05/03/2018  ? Tobacco abuse 12/12/2011  ? Cellulitis of second finger of right hand 05/18/2011  ?  Cellulitis and abscess of finger 05/18/2011  ? Cellulitis of hand 05/18/2011  ? Cellulitis of index finger 05/18/2011  ? Snake bite poisoning 05/17/2011  ? Swelling of second finger of right hand 05/17/2011  ? Pain in finger of right hand 05/17/2011  ? HTN (hypertension) 05/17/2011  ? Hyperlipidemia 05/17/2011  ? FATTY LIVER DISEASE 12/04/2008  ? ABDOMINAL  PAIN-RUQ 12/04/2008  ? ? ?is allergic to codeine. ? ?MEDICAL HISTORY: ?Past Medical History:  ?Diagnosis Date  ? Acute kidney failure ([place]) 04/2018  ? CKD (chronic kidney disease), stage II   ? Complication of anesthesia   ? DDD (degenerative disc disease), lumbar   ? History of pancreatitis   ? 20-30 years ago  ? History of venomous snake bite   ? rt index finger-  ? Hypercholesterolemia   ? Hypertension   ? Nerve pain   ? Right leg, resolved with stimulator  ? PONV (postoperative nausea and vomiting)   ? S/P insertion of spinal cord stimulator 08/2017  ? Spinal stenosis   ? Tobacco abuse   ? ? ?SURGICAL HISTORY: ?Past Surgical History:  ?Procedure Laterality Date  ? COLONOSCOPY    ? FINGER EXPLORATION  05/2011  ? rt index finger snakebitex5  ? HERNIA REPAIR    ? as a child  ? I & D EXTREMITY  11/24/2011  ? Procedure: IRRIGATION AND DEBRIDEMENT EXTREMITY;  Surgeon: Tennis Must, MD;  Location: [facility];  Service: Orthopedics;  Laterality: Right;  right index  ? IR IMAGING GUIDED PORT INSERTION  11/17/2021  ? IR US GUIDE VASC ACCESS RIGHT  11/17/2021  ? MASS EXCISION  01/23/2012  ? Procedure: EXCISION MASS;  Surgeon: Tennis Must, MD;  Location: [facility];  Service: Orthopedics;  Laterality: Right;  Right index a-cell graft  ? TOTAL HIP ARTHROPLASTY Right 05/03/2018  ? Procedure: RIGHT TOTAL HIP ARTHROPLASTY ANTERIOR APPROACH;  Surgeon: Rod Can, MD;  [facility];  Service: Orthopedics;  Laterality: Right;  Needs RNFA  ? ? ?SOCIAL HISTORY: ?Social History  ? ?Socioeconomic History  ? Marital status: Married  ?  Spouse name: Not on file  ? Number of children: Not on file  ? Years of education: Not on file  ? Highest education level: Not on file  ?Occupational History  ? Not on file  ?Tobacco Use  ? Smoking status: Every Day  ?  Packs/day: 0.50  ?  Years: 20.00  ?  Pack years: 10.00  ?  Types: Cigarettes  ? Smokeless tobacco: Never  ?Vaping Use  ? Vaping Use: Never used   ?Substance and Sexual Activity  ? Alcohol use: Yes  ?  Comment: occasional  ? Drug use: No  ? Sexual activity: Not Currently  ?Other Topics Concern  ? Not on file  ?Social History Narrative  ? Not on file  ? ?Social Determinants of Health  ? ?Financial Resource Strain: Low Risk   ? Difficulty of Paying Living Expenses: Not hard at all  ?Food Insecurity: No Food Insecurity  ? Worried About Charity fundraiser in the Last Year: Never true  ? Ran Out of Food in the Last Year: Never true  ?Transportation Needs: No Transportation Needs  ? Lack of Transportation (Medical): No  ? Lack of Transportation (Non-Medical): No  ?Physical Activity: Not on file  ?Stress: Not on file  ?Social Connections: Not on file  ?Intimate Partner Violence: Not on file  ? ? ?FAMILY HISTORY: ?Family History  ?Problem  Relation Age of Onset  ? Diabetes Mother   ? Throat cancer Brother   ? ? ?Review of Systems  ?Constitutional:  Positive for fatigue. Negative for appetite change, chills, fever and unexpected weight change.  ?HENT:   Positive for sore throat. Negative for hearing loss, lump/mass, mouth sores and trouble swallowing.   ?Eyes:  Negative for eye problems and icterus.  ?Respiratory:  Negative for chest tightness, cough and shortness of breath.   ?Cardiovascular:  Negative for chest pain, leg swelling and palpitations.  ?Gastrointestinal:  Positive for nausea. Negative for abdominal distention, abdominal pain, constipation, diarrhea and vomiting.  ?Endocrine: Negative for hot flashes.  ?Genitourinary:  Negative for difficulty urinating.   ?Musculoskeletal:  Negative for arthralgias.  ?Skin:  Negative for itching and rash.  ?Neurological:  Negative for dizziness, extremity weakness, headaches and numbness.  ?Hematological:  Negative for adenopathy. Does not bruise/bleed easily.  ?Psychiatric/Behavioral:  Negative for depression. The patient is not nervous/anxious.    ? ? ?PHYSICAL EXAMINATION ? ?ECOG PERFORMANCE STATUS: 1 - Symptomatic  but completely ambulatory ? ?Physical Exam ?Constitutional:   ?   General: He is not in acute distress. ?   Appearance: Normal appearance. He is not toxic-appearing.  ?HENT:  ?   Head: Normocephalic and atraumatic.

## 2021-12-30 NOTE — Progress Notes (Addendum)
Nutrition follow up completed with patient during infusion for left tonsil cancer. Patient does not have a feeding tube. Final radiation treatment scheduled for April 11. ? ?Weight decreased and documented as 156 pounds, 14 oz, decreased from 164.7 pounds on March 1. This is a 5% wt loss over one month. ? ?Patient has mucositis and reports difficult and painful swallowing. States he did not tolerate hamburger and french fries, even though he chewed well. ?He consumes clam chowder, Broccoli cheese soup, Grits, waffles, bacon and cold cereal. Reports these are well tolerated. ?He has some cold intolerance and prefers foods/beverages at room temperature. ?Reports consuming oral nutrition supplements. He purchased and tried Boost [facility] and states it was well tolerated. It is "expensive" but he has ordered more. ? ?Nutrition diagnosis: Inadequate oral intake continues. ? ?Intervention: ?Continue soft foods as tolerated. ?Increase total oral nutrition supplements to ~ 5 cartons daily based on 24 hour dietary intake. ?Continue Boost [facility], 1 carton, daily.  ?Provided one complimentary case of Ensure High Protein. ?Provided support and encouragement. ? ?Monitoring, Evaluation, Goals: ?Patient will tolerate adequate oral intake to minimize wt loss. ? ?Next Visit: ?Thursday, April 6. ?

## 2021-12-31 ENCOUNTER — Other Ambulatory Visit: Payer: Self-pay | Admitting: Hematology and Oncology

## 2021-12-31 ENCOUNTER — Ambulatory Visit
Admission: RE | Admit: 2021-12-31 | Discharge: 2021-12-31 | Disposition: A | Discharge status: Home / Self Care | Payer: No Typology Code available for payment source | Source: Ambulatory Visit | Attending: Radiation Oncology | Admitting: Radiation Oncology

## 2021-12-31 ENCOUNTER — Other Ambulatory Visit: Payer: Self-pay

## 2021-12-31 DIAGNOSIS — Z5111 Encounter for antineoplastic chemotherapy: Secondary | ICD-10-CM | POA: Diagnosis not present

## 2022-01-03 ENCOUNTER — Ambulatory Visit
Admission: RE | Admit: 2022-01-03 | Discharge: 2022-01-03 | Disposition: A | Discharge status: Home / Self Care | Payer: No Typology Code available for payment source | Source: Ambulatory Visit | Attending: Radiation Oncology | Admitting: Radiation Oncology

## 2022-01-03 ENCOUNTER — Other Ambulatory Visit: Payer: Self-pay

## 2022-01-03 DIAGNOSIS — R634 Abnormal weight loss: Secondary | ICD-10-CM | POA: Diagnosis not present

## 2022-01-03 DIAGNOSIS — K1231 Oral mucositis (ulcerative) due to antineoplastic therapy: Secondary | ICD-10-CM | POA: Diagnosis not present

## 2022-01-03 DIAGNOSIS — T451X5A Adverse effect of antineoplastic and immunosuppressive drugs, initial encounter: Secondary | ICD-10-CM | POA: Diagnosis not present

## 2022-01-03 DIAGNOSIS — Z801 Family history of malignant neoplasm of trachea, bronchus and lung: Secondary | ICD-10-CM | POA: Diagnosis not present

## 2022-01-03 DIAGNOSIS — C099 Malignant neoplasm of tonsil, unspecified: Secondary | ICD-10-CM | POA: Insufficient documentation

## 2022-01-03 DIAGNOSIS — N182 Chronic kidney disease, stage 2 (mild): Secondary | ICD-10-CM | POA: Diagnosis not present

## 2022-01-03 DIAGNOSIS — M5136 Other intervertebral disc degeneration, lumbar region: Secondary | ICD-10-CM | POA: Diagnosis not present

## 2022-01-03 DIAGNOSIS — E785 Hyperlipidemia, unspecified: Secondary | ICD-10-CM | POA: Diagnosis not present

## 2022-01-03 DIAGNOSIS — I10 Essential (primary) hypertension: Secondary | ICD-10-CM | POA: Diagnosis not present

## 2022-01-03 DIAGNOSIS — D696 Thrombocytopenia, unspecified: Secondary | ICD-10-CM | POA: Diagnosis not present

## 2022-01-03 DIAGNOSIS — G893 Neoplasm related pain (acute) (chronic): Secondary | ICD-10-CM | POA: Diagnosis not present

## 2022-01-03 DIAGNOSIS — E7800 Pure hypercholesterolemia, unspecified: Secondary | ICD-10-CM | POA: Diagnosis not present

## 2022-01-03 DIAGNOSIS — Z510 Encounter for antineoplastic radiation therapy: Secondary | ICD-10-CM | POA: Insufficient documentation

## 2022-01-03 DIAGNOSIS — F17210 Nicotine dependence, cigarettes, uncomplicated: Secondary | ICD-10-CM | POA: Diagnosis not present

## 2022-01-03 DIAGNOSIS — Z5111 Encounter for antineoplastic chemotherapy: Secondary | ICD-10-CM | POA: Diagnosis not present

## 2022-01-04 ENCOUNTER — Ambulatory Visit
Admission: RE | Admit: 2022-01-04 | Discharge: 2022-01-04 | Disposition: A | Discharge status: Home / Self Care | Payer: No Typology Code available for payment source | Source: Ambulatory Visit | Attending: Radiation Oncology | Admitting: Radiation Oncology

## 2022-01-04 DIAGNOSIS — C099 Malignant neoplasm of tonsil, unspecified: Secondary | ICD-10-CM

## 2022-01-04 MED ORDER — SONAFINE EX EMUL
1.0000 "application " | Freq: Two times a day (BID) | CUTANEOUS | Status: DC
  Administered 2022-01-04: 1 via TOPICAL

## 2022-01-05 ENCOUNTER — Other Ambulatory Visit: Payer: Self-pay

## 2022-01-05 ENCOUNTER — Encounter: Payer: Self-pay | Admitting: Hematology and Oncology

## 2022-01-05 ENCOUNTER — Inpatient Hospital Stay (HOSPITAL_BASED_OUTPATIENT_CLINIC_OR_DEPARTMENT_OTHER): Payer: No Typology Code available for payment source | Admitting: Hematology and Oncology

## 2022-01-05 ENCOUNTER — Inpatient Hospital Stay: Payer: No Typology Code available for payment source | Attending: Hematology and Oncology

## 2022-01-05 ENCOUNTER — Ambulatory Visit
Admission: RE | Admit: 2022-01-05 | Discharge: 2022-01-05 | Disposition: A | Discharge status: Home / Self Care | Payer: No Typology Code available for payment source | Source: Ambulatory Visit | Attending: Radiation Oncology | Admitting: Radiation Oncology

## 2022-01-05 DIAGNOSIS — Z510 Encounter for antineoplastic radiation therapy: Secondary | ICD-10-CM | POA: Insufficient documentation

## 2022-01-05 DIAGNOSIS — K1231 Oral mucositis (ulcerative) due to antineoplastic therapy: Secondary | ICD-10-CM

## 2022-01-05 DIAGNOSIS — F17210 Nicotine dependence, cigarettes, uncomplicated: Secondary | ICD-10-CM | POA: Insufficient documentation

## 2022-01-05 DIAGNOSIS — N182 Chronic kidney disease, stage 2 (mild): Secondary | ICD-10-CM | POA: Insufficient documentation

## 2022-01-05 DIAGNOSIS — Z95828 Presence of other vascular implants and grafts: Secondary | ICD-10-CM

## 2022-01-05 DIAGNOSIS — E785 Hyperlipidemia, unspecified: Secondary | ICD-10-CM | POA: Insufficient documentation

## 2022-01-05 DIAGNOSIS — R634 Abnormal weight loss: Secondary | ICD-10-CM | POA: Insufficient documentation

## 2022-01-05 DIAGNOSIS — Z801 Family history of malignant neoplasm of trachea, bronchus and lung: Secondary | ICD-10-CM | POA: Insufficient documentation

## 2022-01-05 DIAGNOSIS — Z5111 Encounter for antineoplastic chemotherapy: Secondary | ICD-10-CM | POA: Insufficient documentation

## 2022-01-05 DIAGNOSIS — E7800 Pure hypercholesterolemia, unspecified: Secondary | ICD-10-CM | POA: Insufficient documentation

## 2022-01-05 DIAGNOSIS — T451X5A Adverse effect of antineoplastic and immunosuppressive drugs, initial encounter: Secondary | ICD-10-CM | POA: Insufficient documentation

## 2022-01-05 DIAGNOSIS — I10 Essential (primary) hypertension: Secondary | ICD-10-CM | POA: Insufficient documentation

## 2022-01-05 DIAGNOSIS — C099 Malignant neoplasm of tonsil, unspecified: Secondary | ICD-10-CM | POA: Insufficient documentation

## 2022-01-05 DIAGNOSIS — D696 Thrombocytopenia, unspecified: Secondary | ICD-10-CM | POA: Insufficient documentation

## 2022-01-05 DIAGNOSIS — G893 Neoplasm related pain (acute) (chronic): Secondary | ICD-10-CM | POA: Insufficient documentation

## 2022-01-05 DIAGNOSIS — M5136 Other intervertebral disc degeneration, lumbar region: Secondary | ICD-10-CM | POA: Insufficient documentation

## 2022-01-05 LAB — COMPREHENSIVE METABOLIC PANEL
ALT: 47 U/L — ABNORMAL HIGH (ref 0–44)
AST: 37 U/L (ref 15–41)
Albumin: 3.4 g/dL — ABNORMAL LOW (ref 3.5–5.0)
Alkaline Phosphatase: 213 U/L — ABNORMAL HIGH (ref 38–126)
Anion gap: 6 (ref 5–15)
BUN: 22 mg/dL (ref 8–23)
CO2: 31 mmol/L (ref 22–32)
Calcium: 9.3 mg/dL (ref 8.9–10.3)
Chloride: 101 mmol/L (ref 98–111)
Creatinine, Ser: 0.63 mg/dL (ref 0.61–1.24)
GFR, Estimated: >60 mL/min (ref >60)
Glucose, Bld: 167 mg/dL — ABNORMAL HIGH (ref 70–99)
Potassium: 3.7 mmol/L (ref 3.5–5.1)
Sodium: 138 mmol/L (ref 135–145)
Total Bilirubin: 0.8 mg/dL (ref 0.3–1.2)
Total Protein: 6.2 g/dL — ABNORMAL LOW (ref 6.5–8.1)

## 2022-01-05 LAB — CBC WITH DIFFERENTIAL (CANCER CENTER ONLY)
Abs Immature Granulocytes: 0.03 10*3/uL (ref 0.00–0.07)
Basophils Absolute: 0 10*3/uL (ref 0.0–0.1)
Basophils Relative: 1 %
Eosinophils Absolute: 0.1 10*3/uL (ref 0.0–0.5)
Eosinophils Relative: 3 %
HCT: 30.6 % — ABNORMAL LOW (ref 39.0–52.0)
Hemoglobin: 10.7 g/dL — ABNORMAL LOW (ref 13.0–17.0)
Immature Granulocytes: 1 %
Lymphocytes Relative: 16 %
Lymphs Abs: 0.6 10*3/uL — ABNORMAL LOW (ref 0.7–4.0)
MCH: 33.4 pg (ref 26.0–34.0)
MCHC: 35 g/dL (ref 30.0–36.0)
MCV: 95.6 fL (ref 80.0–100.0)
Monocytes Absolute: 0.9 10*3/uL (ref 0.1–1.0)
Monocytes Relative: 27 %
Neutro Abs: 1.8 10*3/uL (ref 1.7–7.7)
Neutrophils Relative %: 52 %
Platelet Count: 168 10*3/uL (ref 150–400)
RBC: 3.2 MIL/uL — ABNORMAL LOW (ref 4.22–5.81)
RDW: 12.6 % (ref 11.5–15.5)
WBC Count: 3.4 10*3/uL — ABNORMAL LOW (ref 4.0–10.5)
nRBC: 0 % (ref 0.0–0.2)

## 2022-01-05 LAB — MAGNESIUM: Magnesium: 1.7 mg/dL (ref 1.7–2.4)

## 2022-01-05 MED ORDER — SODIUM CHLORIDE 0.9% FLUSH
10.0000 mL | INTRAVENOUS | Status: DC | PRN
  Administered 2022-01-05: 10 mL via INTRAVENOUS

## 2022-01-05 MED ORDER — HEPARIN SOD (PORK) LOCK FLUSH 100 UNIT/ML IV SOLN
500.0000 [IU] | Freq: Once | INTRAVENOUS | Status: AC
  Administered 2022-01-05: 500 [IU] via INTRAVENOUS

## 2022-01-05 NOTE — Assessment & Plan Note (Signed)
He declined G-tube placement.  He has been eating soups and also drinking about 4 ensures a day.  Overall he lost about 10 pounds approximately. ?

## 2022-01-05 NOTE — Assessment & Plan Note (Addendum)
This is a very pleasant 66 year old male patient, current everyday smoker with newly diagnosed squamous cell carcinoma of the left tonsil, p16 negative, staging T1 versus T2, borderline size, and N2 with bilateral cervical lymphadenopathy which are hypermetabolic on PET imaging and M0 with no evidence of distant metastasis referred to medical oncology for consideration of concurrent chemotherapy and radiation.   ?Given bilateral cervical lymphadenopathy, we have discussed about considering concurrent chemotherapy and radiation as frontline treatment.  ?He went to [facility] for a second opinion about role of surgery. He decided to proceed with CRT locally after talking to surgery team. ? ?Cycle 1 day 1 of chemotherapy started on 11/24/2021 ?Cycle 2-day 1 on 12/02/2021 ?Cycle 3-day 1 on 12/09/2021 ?Cycle 4-day 1 12/16/2021 ?Cycle 5-day 1 delayed by 1 week given thrombocytopenia. ?Cycle 6-day 1 due 01/06/2022 ? ?CBC from today satisfactory to proceed with treatment tomorrow ?Rest of the labs pending at the time of our visit.  Okay to proceed if labs are all within parameters.  He will return to clinic in 4 weeks for reevaluation. ?He had complete response on clinical exam. ?

## 2022-01-05 NOTE — Assessment & Plan Note (Signed)
He continues on oxycodone 3 times a day for pain control.  We will continue to monitor this. ?

## 2022-01-05 NOTE — Progress Notes (Signed)
[facility] up: ?  ? ?[name], MD ?[address] ?[place] [address] ? ? 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 ?Squamous cell carcinoma of left tonsil (HCC) ?Staging form: Pharynx - P16 Negative Oropharynx, AJCC 8th Edition ?- Clinical stage from 10/26/2021: Stage IVA (cT2, cN2c, cM0, p16-) - Signed by Eppie Gibson, MD on 10/26/2021 ?Stage prefix: Initial diagnosis ? ? ?SUMMARY OF ONCOLOGIC HISTORY: ?Oncology History  ?Squamous cell carcinoma of left tonsil ([place])  ?10/25/2021 Initial Diagnosis  ? Squamous cell carcinoma of left tonsil ([place]) ?  ?10/26/2021 Cancer Staging  ? Staging form: Pharynx - P16 Negative Oropharynx, AJCC 8th Edition ?- Clinical stage from 10/26/2021: Stage IVA (cT2, cN2c, cM0, p16-) - Signed by Eppie Gibson, MD on 10/26/2021 ?Stage prefix: Initial diagnosis ? ?  ?11/25/2021 -  Chemotherapy  ? Patient is on Treatment Plan : HEAD/NECK Cisplatin q7d  ?   ?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 ([place])  ?10/26/2021 Initial Diagnosis  ? 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 ([place]) ?  ?10/26/2021 Cancer Staging  ? Staging form: Pharynx - P16 Negative Oropharynx, AJCC 8th Edition ?- Clinical stage from 10/26/2021: Stage IVA (cT2, cN2c, cM0, p16-) - Signed by Eppie Gibson, MD on 10/26/2021 ?Stage prefix: Initial diagnosis ? ?  ? ?CURRENT THERAPY: concurrent chemoradiation ? ?INTERVAL HISTORY: ? ?[name] [name] 66 y.o. 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  He began chemotherapy with weekly cisplatin on November 25, 2021. ?He had 5 weekly cycles of cisplatin concurrent with radiation. ?Cycle 6-day 1 due on 01/06/2022. ?He complains  of worsening pain, taking pain medicine every 8 hrs for pain control. ?He is able to eat soups mostly, drinking ensures, 4/5 a day. ?Constipation, taking dulcolax. ?No hearing changes or tinnitus. No neuropath ?A couple episodes of chills 2 days ago, no fevers. ? ?Rest of the pertinent 10  ROS reviewed and negative ? ?Patient Active Problem List  ? Diagnosis Date Noted  ? Weight loss, unintentional 12/30/2021  ? Thrombocytopenia ([place]) 12/22/2021  ? Hypomagnesemia 12/22/2021  ? Mucositis due to antineoplastic therapy 12/15/2021  ? Intractable hiccups 12/01/2021  ? Chemotherapy induced nausea and vomiting 12/01/2021  ? Cancer related pain 11/16/2021  ? Encounter for preoperative dental examination 10/29/2021  ? Caries 10/29/2021  ? Teeth missing 10/29/2021  ? Chronic periodontitis 10/29/2021  ? Accretions on teeth 10/29/2021  ? 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 Excessive dental attrition 10/29/2021  ? Gingival recession, generalized 10/29/2021  ? Phobia of dental procedure 10/29/2021  ? 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 ([place]) 10/26/2021  ? Squamous cell carcinoma of left tonsil ([place]) 10/25/2021  ? Avascular necrosis of hip, right ([place]) 05/03/2018  ? Tobacco abuse 12/12/2011  ? Cellulitis of second finger of right hand 05/18/2011  ? Cellulitis and abscess of finger 05/18/2011  ? Cellulitis of hand 05/18/2011  ? Cellulitis of index finger 05/18/2011  ? Snake bite poisoning 05/17/2011  ? Swelling of second finger of right hand 05/17/2011  ? Pain in finger of right hand 05/17/2011  ? HTN (hypertension) 05/17/2011  ? Hyperlipidemia 05/17/2011  ? FATTY LIVER DISEASE 12/04/2008  ? ABDOMINAL PAIN-RUQ 12/04/2008  ? ? ?is allergic to  codeine. ? ?MEDICAL HISTORY: ?Past Medical History:  ?Diagnosis Date  ? Acute kidney failure ([place]) 04/2018  ? CKD (chronic kidney disease), stage II   ? Complication of anesthesia   ? DDD (degenerative disc disease), lumbar   ? History of pancreatitis   ? 20-30 years ago  ? History  of venomous snake bite   ? rt index finger-  ? Hypercholesterolemia   ? Hypertension   ? Nerve pain   ? Right leg, resolved with stimulator  ? PONV (postoperative nausea and vomiting)   ? S/P insertion of spinal cord stimulator 08/2017  ? Spinal stenosis   ? Tobacco abuse   ? ? ?SURGICAL HISTORY: ?Past Surgical History:  ?Procedure Laterality Date  ? COLONOSCOPY    ? FINGER EXPLORATION  05/2011  ? rt index finger snakebitex5  ? HERNIA REPAIR    ? as a child  ? I & D EXTREMITY  11/24/2011  ? Procedure: IRRIGATION AND DEBRIDEMENT EXTREMITY;  Surgeon: Tennis Must, MD;  Location: [facility];  Service: Orthopedics;  Laterality: Right;  right index  ? IR IMAGING GUIDED PORT INSERTION  11/17/2021  ? IR US GUIDE VASC ACCESS RIGHT  11/17/2021  ? MASS EXCISION  01/23/2012  ? Procedure: EXCISION MASS;  Surgeon: Tennis Must, MD;  Location: [facility];  Service: Orthopedics;  Laterality: Right;  Right index a-cell graft  ? TOTAL HIP ARTHROPLASTY Right 05/03/2018  ? Procedure: RIGHT TOTAL HIP ARTHROPLASTY ANTERIOR APPROACH;  Surgeon: Rod Can, MD;  Location: [facility];  Service: Orthopedics;  Laterality: Right;  Needs RNFA  ? ? ?SOCIAL HISTORY: ?Social History  ? ?Socioeconomic History  ? Marital status: Married  ?  Spouse name: Not on file  ? Number of children: Not on file  ? Years of education: Not on file  ? Highest education level: Not on file  ?Occupational History  ? Not on file  ?Tobacco Use  ? Smoking status: Every Day  ?  Packs/day: 0.50  ?  Years: 20.00  ?  Pack years: 10.00  ?  Types: Cigarettes  ? Smokeless tobacco: Never  ?Vaping Use  ? Vaping Use: Never used  ?Substance and Sexual Activity  ? Alcohol use: Yes  ?  Comment: occasional  ? Drug use: No  ? Sexual activity: Not Currently  ?Other Topics Concern  ? Not on file  ?Social History Narrative  ? Not on file  ? ?Social Determinants of Health  ? ?Financial Resource Strain: Low Risk   ? Difficulty of Paying Living Expenses: Not hard  at all  ?Food Insecurity: No Food Insecurity  ? Worried About Charity fundraiser in the Last Year: Never true  ? Ran Out of Food in the Last Year: Never true  ?Transportation Needs: No Transportation Needs  ? Lack of Transportation (Medical): No  ? Lack of Transportation (Non-Medical): No  ?Physical Activity: Not on file  ?Stress: Not on file  ?Social Connections: Not on file  ?Intimate Partner Violence: Not on file  ? ? ?FAMILY HISTORY: ?Family History  ?Problem Relation Age of Onset  ? Diabetes Mother   ? Throat cancer Brother   ? ? ? ?PHYSICAL EXAMINATION ? ?ECOG PERFORMANCE STATUS: 1 - Symptomatic but completely ambulatory ? ?Physical Exam ?Constitutional:   ?   General: He is not in acute distress. ?   Appearance: Normal appearance. He is not toxic-appearing.  ?HENT:  ?   Head: Normocephalic and atraumatic.  ?  Mouth/Throat:  ?   Mouth: Mucous membranes are moist.  ?   Pharynx: Posterior oropharyngeal erythema (Erythema posterior oropharynx with ulceration) present. No oropharyngeal exudate.  ?Eyes:  ?   General: No scleral icterus. ?Cardiovascular:  ?   Rate and Rhythm: Normal rate and regular rhythm.  ?   Pulses: Normal pulses.  ?   Heart sounds: Normal heart sounds.  ?Pulmonary:  ?   Effort: Pulmonary effort is normal.  ?   Breath sounds: Normal breath sounds.  ?Abdominal:  ?   General: Abdomen is flat. Bowel sounds are normal. There is no distension.  ?   Palpations: Abdomen is soft.  ?   Tenderness: There is no abdominal tenderness.  ?Musculoskeletal:     ?   General: No swelling.  ?   Cervical back: Neck supple.  ?Lymphadenopathy:  ?   Cervical: No cervical adenopathy.  ?Skin: ?   General: Skin is warm and dry.  ?   Findings: Rash (Radiation changes) present.  ?Neurological:  ?   General: No focal deficit present.  ?   Mental Status: He is alert.  ?Psychiatric:     ?   Mood and Affect: Mood normal.     ?   Behavior: Behavior normal.  ? ? ?LABORATORY DATA: ? ?CBC ?   ?Component Value Date/Time  ? WBC 3.4  (L) 01/05/2022 1223  ? WBC 7.1 12/01/2021 0910  ? RBC 3.20 (L) 01/05/2022 1223  ? HGB 10.7 (L) 01/05/2022 1223  ? HCT 30.6 (L) 01/05/2022 1223  ? PLT 168 01/05/2022 1223  ? MCV 95.6 01/05/2022 1223

## 2022-01-06 ENCOUNTER — Inpatient Hospital Stay: Payer: No Typology Code available for payment source | Admitting: Nutrition

## 2022-01-06 ENCOUNTER — Ambulatory Visit
Admission: RE | Admit: 2022-01-06 | Discharge: 2022-01-06 | Disposition: A | Discharge status: Home / Self Care | Payer: No Typology Code available for payment source | Source: Ambulatory Visit | Attending: Radiation Oncology | Admitting: Radiation Oncology

## 2022-01-06 ENCOUNTER — Inpatient Hospital Stay: Payer: No Typology Code available for payment source

## 2022-01-06 ENCOUNTER — Ambulatory Visit: Payer: No Typology Code available for payment source | Attending: Neurology

## 2022-01-06 VITALS — BP 98/75 | HR 65 | Temp 98.5°F | Resp 16 | Wt 151.8 lb

## 2022-01-06 DIAGNOSIS — R1310 Dysphagia, unspecified: Secondary | ICD-10-CM | POA: Insufficient documentation

## 2022-01-06 DIAGNOSIS — C099 Malignant neoplasm of tonsil, unspecified: Secondary | ICD-10-CM

## 2022-01-06 MED ORDER — SODIUM CHLORIDE 0.9 % IV SOLN
40.0000 mg/m2 | Freq: Once | INTRAVENOUS | Status: AC
  Administered 2022-01-06: 74 mg via INTRAVENOUS
  Filled 2022-01-06: qty 74

## 2022-01-06 MED ORDER — HEPARIN SOD (PORK) LOCK FLUSH 100 UNIT/ML IV SOLN
500.0000 [IU] | Freq: Once | INTRAVENOUS | Status: AC | PRN
  Administered 2022-01-06: 500 [IU]

## 2022-01-06 MED ORDER — SODIUM CHLORIDE 0.9 % IV SOLN
10.0000 mg | Freq: Once | INTRAVENOUS | Status: AC
  Administered 2022-01-06: 10 mg via INTRAVENOUS
  Filled 2022-01-06: qty 10

## 2022-01-06 MED ORDER — PALONOSETRON HCL INJECTION 0.25 MG/5ML
0.2500 mg | Freq: Once | INTRAVENOUS | Status: AC
  Administered 2022-01-06: 0.25 mg via INTRAVENOUS
  Filled 2022-01-06: qty 5

## 2022-01-06 MED ORDER — SODIUM CHLORIDE 0.9 % IV SOLN: Freq: Once | INTRAVENOUS | Status: AC

## 2022-01-06 MED ORDER — SODIUM CHLORIDE 0.9 % IV SOLN
150.0000 mg | Freq: Once | INTRAVENOUS | Status: AC
  Administered 2022-01-06: 150 mg via INTRAVENOUS
  Filled 2022-01-06: qty 150

## 2022-01-06 MED ORDER — POTASSIUM CHLORIDE IN NACL 20-0.9 MEQ/L-% IV SOLN
Freq: Once | INTRAVENOUS | Status: AC
  Filled 2022-01-06: qty 1000

## 2022-01-06 MED ORDER — MAGNESIUM SULFATE 2 GM/50ML IV SOLN
2.0000 g | Freq: Once | INTRAVENOUS | Status: AC
  Administered 2022-01-06: 2 g via INTRAVENOUS
  Filled 2022-01-06: qty 50

## 2022-01-06 MED ORDER — SODIUM CHLORIDE 0.9% FLUSH
10.0000 mL | INTRAVENOUS | Status: DC | PRN
  Administered 2022-01-06: 10 mL

## 2022-01-06 NOTE — Progress Notes (Signed)
Nutrition follow-up completed with patient during infusion for left tonsil cancer.  Patient does not have a feeding tube.  Final radiation therapy is scheduled for April 11.  Today is patient's sixth and final cycle of chemotherapy. ? ?Weight decreased and documented as 151 pounds April 6.  This is decreased from 164.7 pounds March 1.  This is an 8% weight loss over 1 month. ? ?Labs include glucose 167 and albumin 3.4. ? ?Patient reports slight nausea.  He is noticing increased pain with swallowing.  Reports he tried to eat pizza and tolerated about 5 bites before he had to stop.  He is still drinking 4-5 cartons of Ensure Plus and 1 carton of boost VHC.  Reports increased constipation and is using Dulcolax. ? ?Estimated nutrition needs: 2300-2500 cal, 90-115 g protein, 2.4 L fluid. ? ?Nutrition diagnosis: Inadequate oral intake continues. ? ?Intervention: ?Increase oral nutrition supplements to 1 carton boost VHC + 5 cartons Ensure Plus high-protein.  This provides approximately 2280 cal and 122 g protein. ?Continue soft foods and liquids as tolerated. ?Encouraged patient employ strategies to minimize constipation, especially with increased pain medication. ?Provided 1 additional case of complementary Ensure Plus high-protein. ? ?Monitoring, evaluation, goals: ?Patient will tolerate increased calories and protein to minimize further weight loss. ? ?Next visit: Will contact patient by phone in approximately 2-4 weeks.  I have encouraged him to let me know when he needs another case of Ensure plus high-protein. ? ?**Disclaimer: This note was dictated with voice recognition software. Similar sounding words can inadvertently be transcribed and this note may contain transcription errors which may not have been corrected upon publication of note.** ? ? ? ?

## 2022-01-06 NOTE — Therapy (Signed)
[facility] ?[address], [address] ?[place], [place], [address] ?Phone: [phone]   Fax:  [phone] ? ?Speech Language Pathology Treatment ? ?Patient Details  ?Name: [name] [name] ?MRN: [id number] ?Date of Birth: [birth year] ?Referring Provider (SLP): [name] Gibson, MD ? ? ?Encounter Date: 01/06/2022 ? ? End of Session - 01/06/22 1113   ? ? Visit Number 2   ? Number of Visits 3   ? Date for SLP Re-Evaluation 03/02/22   ? SLP Start Time 1029   ? SLP Stop Time  1055   ? SLP Time Calculation (min) 26 min   ? Activity Tolerance Patient tolerated treatment well   ? ?  ?  ? ?  ? ? ?Past Medical History:  ?Diagnosis Date  ? Acute kidney failure ([place]) 04/2018  ? CKD (chronic kidney disease), stage II   ? Complication of anesthesia   ? DDD (degenerative disc disease), lumbar   ? History of pancreatitis   ? 20-30 years ago  ? History of venomous snake bite   ? rt index finger-  ? Hypercholesterolemia   ? Hypertension   ? Nerve pain   ? Right leg, resolved with stimulator  ? PONV (postoperative nausea and vomiting)   ? S/P insertion of spinal cord stimulator 08/2017  ? Spinal stenosis   ? Tobacco abuse   ? ? ?Past Surgical History:  ?Procedure Laterality Date  ? COLONOSCOPY    ? FINGER EXPLORATION  05/2011  ? rt index finger snakebitex5  ? HERNIA REPAIR    ? as a child  ? I & D EXTREMITY  11/24/2011  ? Procedure: IRRIGATION AND DEBRIDEMENT EXTREMITY;  Surgeon: Tennis Must, MD;  Location: [facility];  Service: Orthopedics;  Laterality: Right;  right index  ? IR IMAGING GUIDED PORT INSERTION  11/17/2021  ? IR US GUIDE VASC ACCESS RIGHT  11/17/2021  ? MASS EXCISION  01/23/2012  ? Procedure: EXCISION MASS;  Surgeon: Tennis Must, MD;  Location: [facility];  Service: Orthopedics;  Laterality: Right;  Right index a-cell graft  ? TOTAL HIP ARTHROPLASTY Right 05/03/2018  ? Procedure: RIGHT TOTAL HIP ARTHROPLASTY ANTERIOR APPROACH;  Surgeon: Rod Can, MD;   [facility];  Service: Orthopedics;  Laterality: Right;  Needs RNFA  ? ? ?There were no vitals filed for this visit. ? ? Subjective Assessment - 01/06/22 1035   ? ? Subjective Pt has not req'd PEG. He has consumed mostly soups, Ensure, and a few bites of solids such as pizza and a salad.   ? Currently in Pain? Yes   when swallowing  ? ?  ?  ? ?  ? ? ? ? ? ? ? ? ADULT SLP TREATMENT - 01/06/22 1056   ? ?  ? General Information  ? Behavior/Cognition Alert;Cooperative;Pleasant mood   ?  ? Treatment Provided  ? Treatment provided Dysphagia   ?  ? Dysphagia Treatment  ? Temperature Spikes Noted No   ? Respiratory Status Room air   ? Oral Cavity - Dentition Adequate natural dentition   ? Treatment Methods Skilled observation;Patient/caregiver education   ? Patient observed directly with PO's Yes   ? Type of PO's observed Dysphagia 1 (puree);Thin liquids   ? Liquids provided via Straw   ? Oral Phase Signs & Symptoms Other (comment)   none noted  ? Pharyngeal Phase Signs & Symptoms Other (comment)   none noted  ? Other treatment/comments [name] [name] reports  he mainly eats his desired diet - endorses pain 8-10/10 with swallowing and so he uses magic mouthwash every 4-5 bites. "I can eat anything it just hurts real bad."  Pt stated he is completing HEP approx 1-2x/week. He told SLP rationale for HEP with min A from SLP.   ?  ? Assessment / Recommendations / Plan  ? Plan Continue with current plan of care   ?  ? Dysphagia Recommendations  ? Diet recommendations --   as tolerated  ? Liquids provided via Cup;Straw   ? Medication Administration --   as tolerated  ? Supervision Patient able to self feed   ? ?  ?  ? ?  ? ? ? SLP Education - 01/06/22 1109   ? ? Education Details cycling through the exercises, other compensations for pain and HEP, need for HEP more frequently than pt currently performing   ? Person(s) Educated Patient   ? Methods Explanation   ? Comprehension Verbalized understanding   ? ?  ?  ? ?  ? ?SLP Short Term  Goals - 01/06/22  ?       ?SLP SHORT TERM GOAL #1    ?  Title ?Time pt will complete HEP with rare min A  ? 1  ?  Period --   sessions, for all STGs  ?  Status Not met, Ongoing  ?       ?  SLP SHORT TERM GOAL #2  ?  Title ?  ?Time ?Period pt will tell SLP why pt is completing HEP with modified independence x2 visits ?1     ?  Status Ongoing  ?       ?  SLP SHORT TERM GOAL #3  ?  Title ?  ?Time ?Period pt will describe 3 overt s/s aspiration PNA with modified independence  ?1  ?  Status Ongoing  ?       ?  SLP SHORT TERM GOAL #4  ?  Title ?  ?Time ?Period pt will tell SLP how a food journal could hasten return to a more normalized diet  ?1  ?  Status Ongoing  ?  ?   ?  ?  ?   ?  ?  ?  SLP Long Term Goals - 01/06/22    ?  ?       ?       ?  SLP LONG TERM GOAL #1  ?  Title pt will complete HEP with modified independence over three visits   ?  Time 3  ?  Period --   sessions, for all LTGs  ?  Status Ongoing  ?       ?  SLP LONG TERM GOAL #2  ?  Title pt will describe how to modify HEP over time, and the timeline associated with reduction in HEP frequency with modified independence over two sessions   ?  Time ?Period 5 ?   ?  Status Ongoing  ?  ?   ?  ?  ?   ?  ?  ?  Plan - 01/06/22  ?  ?  Clinical Impression Statement As above pt swallowing is deemed WNL with items assessed today - regular diet and thin liquid. SLP developed an individualized HEP for dysphagia and pt completed each exercise on his own initially with rare min assist from SLP faded to pt independence. There are no overt s/s aspiration with POs reported by pt at this time,  nor overt s/sx aspiration PNA; Neither oral nor pharyngeal difficulties were detected today when SLP observed pt with POs. Data indicate that pt's swallow ability will likely decrease over the course of radiation therapy and could very well decline over time following conclusion of their radiation therapy due to muscle fibrosis and/or atrophy. Pt will cont to need to be seen by SLP in  order to assess safety of PO intake, assess the need for recommending any objective swallow assessment, and ensuring pt correctly completes the individualized HEP.   ?  Speech Therapy Frequency once approx every 4 weeks  ?  Duration 7 total sessions; 3 for this reporting period  ?  Treatment/Interventions Aspiration precaution training;Laryngeal and Pharyngeal strengthening exercises;Diet toleration management by SLP;Trials of upgraded texture/liquids;Internal/external aids;Patient/family education;SLP instruction and feedback;Compensatory strategies;Cueing hierarchy   ?  Potential to Achieve Goals Good   ?  ? ? ? ? ? ? ? ?Patient will benefit from skilled therapeutic intervention in order to improve the following deficits and impairments:   ?Dysphagia, unspecified type ? ? ? ?Problem List ?Patient Active Problem List  ? Diagnosis Date Noted  ? Weight loss, unintentional 12/30/2021  ? Thrombocytopenia ([place]) 12/22/2021  ? Hypomagnesemia 12/22/2021  ? Mucositis due to antineoplastic therapy 12/15/2021  ? Intractable hiccups 12/01/2021  ? Chemotherapy induced nausea and vomiting 12/01/2021  ? Cancer related pain 11/16/2021  ? Encounter for preoperative dental examination 10/29/2021  ? Caries 10/29/2021  ? Teeth missing 10/29/2021  ? Chronic periodontitis 10/29/2021  ? Accretions on teeth 10/29/2021  ? 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 Excessive dental attrition 10/29/2021  ? Gingival recession, generalized 10/29/2021  ? Phobia of dental procedure 10/29/2021  ? 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 ([place]) 10/26/2021  ? Squamous cell carcinoma of left tonsil ([place]) 10/25/2021  ? Avascular necrosis of hip, right ([place]) 05/03/2018  ? Tobacco abuse 12/12/2011  ? Cellulitis of second finger of right hand 05/18/2011  ? Cellulitis and abscess of finger 05/18/2011  ? Cellulitis of hand 05/18/2011  ? Cellulitis of index finger 05/18/2011  ? Snake bite poisoning 05/17/2011  ? Swelling of second finger of right hand  05/17/2011  ? Pain in finger of right hand 05/17/2011  ? HTN (hypertension) 05/17/2011  ? Hyperlipidemia 05/17/2011  ? FATTY LIVER DISEASE 12/04/2008  ? ABDOMINAL PAIN-RUQ 12/04/2008  ? ? ?Rasaan Brotherton, CCC-SLP ?4

## 2022-01-06 NOTE — Patient Instructions (Signed)
[facility]ICAL ONCOLOGY  Discharge Instructions: ?Thank you for choosing [facility] to provide your oncology and hematology care.  ? ?If you have a lab appointment with the Cancer Center, please go directly to the Cancer Center and check in at the registration area. ?  ?Wear comfortable clothing and clothing appropriate for easy access to any Portacath or PICC line.  ? ?We strive to give you quality time with your provider. You may need to reschedule your appointment if you arrive late (15 or more minutes).  Arriving late affects you and other patients whose appointments are after yours.  Also, if you miss three or more appointments without notifying the office, you may be dismissed from the clinic at the provider?s discretion.    ?  ?For prescription refill requests, have your pharmacy contact our office and allow 72 hours for refills to be completed.   ? ?Today you received the following chemotherapy and/or immunotherapy agent: Cisplatin ?  ?To help prevent nausea and vomiting after your treatment, we encourage you to take your nausea medication as directed. ? ?BELOW ARE SYMPTOMS THAT SHOULD BE REPORTED IMMEDIATELY: ?*FEVER GREATER THAN 100.4 F (38 ?C) OR HIGHER ?*CHILLS OR SWEATING ?*NAUSEA AND VOMITING THAT IS NOT CONTROLLED WITH YOUR NAUSEA MEDICATION ?*UNUSUAL SHORTNESS OF BREATH ?*UNUSUAL BRUISING OR BLEEDING ?*URINARY PROBLEMS (pain or burning when urinating, or frequent urination) ?*BOWEL PROBLEMS (unusual diarrhea, constipation, pain near the anus) ?TENDERNESS IN MOUTH AND THROAT WITH OR WITHOUT PRESENCE OF ULCERS (sore throat, sores in mouth, or a toothache) ?UNUSUAL RASH, SWELLING OR PAIN  ?UNUSUAL VAGINAL DISCHARGE OR ITCHING  ? ?Items with * indicate a potential emergency and should be followed up as soon as possible or go to the Emergency Department if any problems should occur. ? ?Please show the CHEMOTHERAPY ALERT CARD or IMMUNOTHERAPY ALERT CARD at check-in to the  Emergency Department and triage nurse. ? ?Should you have questions after your visit or need to cancel or reschedule your appointment, please contact [facility]  Dept: [phone]  and follow the prompts.  Office hours are 8:00 a.m. to 4:30 p.m. Monday - Friday. Please note that voicemails left after 4:00 p.m. may not be returned until the following business day.  We are closed weekends and major holidays. You have access to a nurse at all times for urgent questions. Please call the main number to the clinic Dept: [phone] and follow the prompts. ? ? ?For any non-urgent questions, you may also contact your provider using MyChart. We now offer e-Visits for anyone 18 and older to request care online for non-urgent symptoms. For details visit [email or web address]. ?  ?Also download the MyChart app! Go to the app store, search "MyChart", open the app, select [facility], and log in with your MyChart username and password. ? ?Due to Covid, a mask is required upon entering the hospital/clinic. If you do not have a mask, one will be given to you upon arrival. For doctor visits, patients may have 1 support person aged 18 or older with them. For treatment visits, patients cannot have anyone with them due to current Covid guidelines and our immunocompromised population.  ? ?

## 2022-01-07 ENCOUNTER — Ambulatory Visit
Admission: RE | Admit: 2022-01-07 | Discharge: 2022-01-07 | Disposition: A | Discharge status: Home / Self Care | Payer: No Typology Code available for payment source | Source: Ambulatory Visit | Attending: Radiation Oncology | Admitting: Radiation Oncology

## 2022-01-07 ENCOUNTER — Other Ambulatory Visit: Payer: Self-pay

## 2022-01-07 DIAGNOSIS — C099 Malignant neoplasm of tonsil, unspecified: Secondary | ICD-10-CM | POA: Diagnosis not present

## 2022-01-08 ENCOUNTER — Other Ambulatory Visit: Payer: Self-pay | Admitting: Hematology and Oncology

## 2022-01-10 ENCOUNTER — Telehealth: Payer: Self-pay

## 2022-01-10 ENCOUNTER — Encounter: Payer: Self-pay | Admitting: Hematology and Oncology

## 2022-01-10 ENCOUNTER — Other Ambulatory Visit: Payer: Self-pay

## 2022-01-10 ENCOUNTER — Ambulatory Visit
Admission: RE | Admit: 2022-01-10 | Discharge: 2022-01-10 | Disposition: A | Discharge status: Home / Self Care | Payer: No Typology Code available for payment source | Source: Ambulatory Visit | Attending: Radiation Oncology | Admitting: Radiation Oncology

## 2022-01-10 DIAGNOSIS — C099 Malignant neoplasm of tonsil, unspecified: Secondary | ICD-10-CM | POA: Diagnosis not present

## 2022-01-10 NOTE — Telephone Encounter (Signed)
Notified Patient of completion of Critical Illness Claim Form. Copy of form placed in bin at Registration Desk #1 for pick-up as requested. Claim Form forwarded to [employer] with signed Release of Information Form to obtain copy of pathology report and consultation note as requested by [employer].No other needs or concerns voiced at this time. ?

## 2022-01-11 ENCOUNTER — Ambulatory Visit
Admission: RE | Admit: 2022-01-11 | Discharge: 2022-01-11 | Disposition: A | Discharge status: Home / Self Care | Payer: No Typology Code available for payment source | Source: Ambulatory Visit | Attending: Radiation Oncology | Admitting: Radiation Oncology

## 2022-01-11 ENCOUNTER — Encounter: Payer: Self-pay | Admitting: Radiation Oncology

## 2022-01-11 DIAGNOSIS — C099 Malignant neoplasm of tonsil, unspecified: Secondary | ICD-10-CM | POA: Diagnosis not present

## 2022-01-11 LAB — BASIC METABOLIC PANEL - CANCER CENTER ONLY
Anion gap: 5 (ref 5–15)
BUN: 24 mg/dL — ABNORMAL HIGH (ref 8–23)
CO2: 32 mmol/L (ref 22–32)
Calcium: 9 mg/dL (ref 8.9–10.3)
Chloride: 101 mmol/L (ref 98–111)
Creatinine: 0.69 mg/dL (ref 0.61–1.24)
GFR, Estimated: >60 mL/min (ref >60)
Glucose, Bld: 141 mg/dL — ABNORMAL HIGH (ref 70–99)
Potassium: 4.2 mmol/L (ref 3.5–5.1)
Sodium: 138 mmol/L (ref 135–145)

## 2022-01-11 NOTE — Progress Notes (Signed)
Oncology Nurse Navigator Documentation  ? ?Met with Britt Boozer after final RT to offer support and to celebrate end of radiation treatment.   ?Provided verbal/written post-RT guidance: ?Importance of keeping all follow-up appts, especially those with Nutrition and SLP. ?Importance of protecting treatment area from sun. ?Continuation of Sonafine application 2-3 times daily, application of antibiotic ointment to areas of raw skin; when supply of Sonafine exhausted transition to OTC lotion with vitamin E. ?Provided/reviewed Epic calendar of upcoming appts. ?Explained my role as navigator will continue for several more months, encouraged him to call me with needs/concerns.   ? ?Harlow Asa RN, BSN, OCN ?Head & Neck Oncology Nurse Navigator ?[facility] [place] [facility] ?Phone # [phone]  ?Fax # [phone]   ?

## 2022-01-12 ENCOUNTER — Inpatient Hospital Stay: Payer: No Typology Code available for payment source

## 2022-01-12 ENCOUNTER — Inpatient Hospital Stay: Payer: No Typology Code available for payment source | Admitting: Hematology and Oncology

## 2022-01-14 ENCOUNTER — Other Ambulatory Visit: Payer: Self-pay | Admitting: Hematology and Oncology

## 2022-01-14 DIAGNOSIS — C099 Malignant neoplasm of tonsil, unspecified: Secondary | ICD-10-CM

## 2022-01-14 MED ORDER — OXYCODONE HCL 5 MG PO TABS: 5.0000 mg | ORAL_TABLET | ORAL | 0 refills | Status: AC | PRN

## 2022-01-18 ENCOUNTER — Other Ambulatory Visit: Payer: Self-pay | Admitting: Hematology and Oncology

## 2022-01-25 ENCOUNTER — Ambulatory Visit: Payer: No Typology Code available for payment source | Admitting: Radiation Oncology

## 2022-01-25 ENCOUNTER — Ambulatory Visit: Payer: Self-pay | Admitting: Physical Therapy

## 2022-01-25 ENCOUNTER — Telehealth: Payer: Self-pay | Admitting: *Deleted

## 2022-01-25 NOTE — Telephone Encounter (Signed)
CALLED PATIENT TO INFORM THAT DR. SQUIRE SAID THAT SINCE HE IS SEEING DR. Chryl Heck ON 01-31-22 IT WOULD BE OK TO SEE HIM 2 WEEKS AFTER THAT, PATIENT AGREED TO COME TO SEE DR. SQUIRE ON 02-15-22 @ 3:20 PM ?

## 2022-01-26 ENCOUNTER — Other Ambulatory Visit: Payer: Self-pay | Admitting: Hematology and Oncology

## 2022-01-28 ENCOUNTER — Other Ambulatory Visit (HOSPITAL_COMMUNITY): Payer: No Typology Code available for payment source | Admitting: Dentistry

## 2022-01-28 ENCOUNTER — Encounter: Payer: Self-pay | Admitting: Hematology and Oncology

## 2022-01-28 DIAGNOSIS — Z95828 Presence of other vascular implants and grafts: Secondary | ICD-10-CM | POA: Insufficient documentation

## 2022-01-31 ENCOUNTER — Inpatient Hospital Stay: Payer: No Typology Code available for payment source

## 2022-01-31 ENCOUNTER — Inpatient Hospital Stay: Payer: No Typology Code available for payment source | Attending: Hematology and Oncology

## 2022-01-31 ENCOUNTER — Inpatient Hospital Stay (HOSPITAL_BASED_OUTPATIENT_CLINIC_OR_DEPARTMENT_OTHER): Payer: No Typology Code available for payment source | Admitting: Hematology and Oncology

## 2022-01-31 ENCOUNTER — Other Ambulatory Visit: Payer: Self-pay | Admitting: *Deleted

## 2022-01-31 ENCOUNTER — Encounter: Payer: Self-pay | Admitting: Hematology and Oncology

## 2022-01-31 ENCOUNTER — Other Ambulatory Visit: Payer: Self-pay

## 2022-01-31 ENCOUNTER — Other Ambulatory Visit: Payer: Self-pay | Admitting: Lab

## 2022-01-31 VITALS — BP 115/85 | HR 92 | Temp 97.7°F | Resp 18 | Ht 68.0 in | Wt 150.0 lb

## 2022-01-31 DIAGNOSIS — Z808 Family history of malignant neoplasm of other organs or systems: Secondary | ICD-10-CM | POA: Insufficient documentation

## 2022-01-31 DIAGNOSIS — R509 Fever, unspecified: Secondary | ICD-10-CM

## 2022-01-31 DIAGNOSIS — N182 Chronic kidney disease, stage 2 (mild): Secondary | ICD-10-CM | POA: Insufficient documentation

## 2022-01-31 DIAGNOSIS — C099 Malignant neoplasm of tonsil, unspecified: Secondary | ICD-10-CM

## 2022-01-31 DIAGNOSIS — I129 Hypertensive chronic kidney disease with stage 1 through stage 4 chronic kidney disease, or unspecified chronic kidney disease: Secondary | ICD-10-CM | POA: Insufficient documentation

## 2022-01-31 DIAGNOSIS — T50905A Adverse effect of unspecified drugs, medicaments and biological substances, initial encounter: Secondary | ICD-10-CM | POA: Insufficient documentation

## 2022-01-31 DIAGNOSIS — K1231 Oral mucositis (ulcerative) due to antineoplastic therapy: Secondary | ICD-10-CM

## 2022-01-31 DIAGNOSIS — Z79899 Other long term (current) drug therapy: Secondary | ICD-10-CM | POA: Insufficient documentation

## 2022-01-31 DIAGNOSIS — D696 Thrombocytopenia, unspecified: Secondary | ICD-10-CM

## 2022-01-31 DIAGNOSIS — F17210 Nicotine dependence, cigarettes, uncomplicated: Secondary | ICD-10-CM | POA: Diagnosis not present

## 2022-01-31 DIAGNOSIS — K1232 Oral mucositis (ulcerative) due to other drugs: Secondary | ICD-10-CM | POA: Insufficient documentation

## 2022-01-31 DIAGNOSIS — Z95828 Presence of other vascular implants and grafts: Secondary | ICD-10-CM

## 2022-01-31 LAB — CBC WITH DIFFERENTIAL (CANCER CENTER ONLY)
Abs Immature Granulocytes: 0.01 10*3/uL (ref 0.00–0.07)
Basophils Absolute: 0 10*3/uL (ref 0.0–0.1)
Basophils Relative: 0 %
Eosinophils Absolute: 0.2 10*3/uL (ref 0.0–0.5)
Eosinophils Relative: 4 %
HCT: 32.5 % — ABNORMAL LOW (ref 39.0–52.0)
Hemoglobin: 11.3 g/dL — ABNORMAL LOW (ref 13.0–17.0)
Immature Granulocytes: 0 %
Lymphocytes Relative: 21 %
Lymphs Abs: 0.8 10*3/uL (ref 0.7–4.0)
MCH: 33.9 pg (ref 26.0–34.0)
MCHC: 34.8 g/dL (ref 30.0–36.0)
MCV: 97.6 fL (ref 80.0–100.0)
Monocytes Absolute: 0.4 10*3/uL (ref 0.1–1.0)
Monocytes Relative: 9 %
Neutro Abs: 2.6 10*3/uL (ref 1.7–7.7)
Neutrophils Relative %: 66 %
Platelet Count: 35 10*3/uL — ABNORMAL LOW (ref 150–400)
RBC: 3.33 MIL/uL — ABNORMAL LOW (ref 4.22–5.81)
RDW: 15.1 % (ref 11.5–15.5)
WBC Count: 4 10*3/uL (ref 4.0–10.5)
nRBC: 0 % (ref 0.0–0.2)

## 2022-01-31 LAB — URINALYSIS, COMPLETE (UACMP) WITH MICROSCOPIC
Bilirubin Urine: NEGATIVE
Glucose, UA: 50 mg/dL — AB
Hgb urine dipstick: NEGATIVE
Ketones, ur: NEGATIVE mg/dL
Leukocytes,Ua: NEGATIVE
Nitrite: NEGATIVE
Protein, ur: 30 mg/dL — AB
Specific Gravity, Urine: 1.026 (ref 1.005–1.030)
pH: 5 (ref 5.0–8.0)

## 2022-01-31 LAB — COMPREHENSIVE METABOLIC PANEL
ALT: 19 U/L (ref 0–44)
AST: 38 U/L (ref 15–41)
Albumin: 3.7 g/dL (ref 3.5–5.0)
Alkaline Phosphatase: 119 U/L (ref 38–126)
Anion gap: 10 (ref 5–15)
BUN: 19 mg/dL (ref 8–23)
CO2: 28 mmol/L (ref 22–32)
Calcium: 8.7 mg/dL — ABNORMAL LOW (ref 8.9–10.3)
Chloride: 104 mmol/L (ref 98–111)
Creatinine, Ser: 0.66 mg/dL (ref 0.61–1.24)
GFR, Estimated: >60 mL/min (ref >60)
Glucose, Bld: 185 mg/dL — ABNORMAL HIGH (ref 70–99)
Potassium: 3.5 mmol/L (ref 3.5–5.1)
Sodium: 142 mmol/L (ref 135–145)
Total Bilirubin: 0.5 mg/dL (ref 0.3–1.2)
Total Protein: 6.2 g/dL — ABNORMAL LOW (ref 6.5–8.1)

## 2022-01-31 LAB — MAGNESIUM: Magnesium: 1.6 mg/dL — ABNORMAL LOW (ref 1.7–2.4)

## 2022-01-31 MED ORDER — AMOXICILLIN-POT CLAVULANATE 875-125 MG PO TABS: 1.0000 | ORAL_TABLET | Freq: Two times a day (BID) | ORAL | 0 refills | Status: DC

## 2022-01-31 MED ORDER — HEPARIN SOD (PORK) LOCK FLUSH 100 UNIT/ML IV SOLN
500.0000 [IU] | Freq: Once | INTRAVENOUS | Status: AC
  Administered 2022-01-31: 500 [IU]

## 2022-01-31 MED ORDER — SODIUM CHLORIDE 0.9% FLUSH
10.0000 mL | Freq: Once | INTRAVENOUS | Status: AC
  Administered 2022-01-31: 10 mL

## 2022-01-31 NOTE — Addendum Note (Signed)
Addended by: Adaline Sill on: 01/31/2022 10:43 AM ? ? Modules accepted: Orders ? ?

## 2022-01-31 NOTE — Progress Notes (Signed)
[facility] up: ?  ? ?[name] Sites, MD ?[address] ?[place] [address] ? ? 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 ?Squamous cell carcinoma of left tonsil (HCC) ?Staging form: Pharynx - P16 Negative Oropharynx, AJCC 8th Edition ?- Clinical stage from 10/26/2021: Stage IVA (cT2, cN2c, cM0, p16-) - Signed by Eppie Gibson, MD on 10/26/2021 ?Stage prefix: Initial diagnosis ? ? ?SUMMARY OF ONCOLOGIC HISTORY: ?Oncology History  ?Squamous cell carcinoma of left tonsil ([place])  ?10/25/2021 Initial Diagnosis  ? Squamous cell carcinoma of left tonsil (HCC) ? ?  ?10/26/2021 Cancer Staging  ? Staging form: Pharynx - P16 Negative Oropharynx, AJCC 8th Edition ?- Clinical stage from 10/26/2021: Stage IVA (cT2, cN2c, cM0, p16-) - Signed by Eppie Gibson, MD on 10/26/2021 ?Stage prefix: Initial diagnosis ? ?  ?11/25/2021 -  Chemotherapy  ? Patient is on Treatment Plan : HEAD/NECK Cisplatin q7d  ? ?  ?  ?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 ([name] [name])  ?10/26/2021 Initial Diagnosis  ? 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 (HCC) ? ?  ?10/26/2021 Cancer Staging  ? Staging form: Pharynx - P16 Negative Oropharynx, AJCC 8th Edition ?- Clinical stage from 10/26/2021: Stage IVA (cT2, cN2c, cM0, p16-) - Signed by Eppie Gibson, MD on 10/26/2021 ?Stage prefix: Initial diagnosis ? ?  ? ?CURRENT THERAPY: concurrent chemoradiation ? ?INTERVAL HISTORY: ? ?[name] [name] 66 y.o. 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  He began chemotherapy with weekly cisplatin on November 25, 2021. ?He had 6 weekly cycles of cisplatin on 01/06/2022. He completed radiation on 01/11/2022 ?He continues  to have severe pain in throat stopped taking percocet because it doesn't make him feel good. He is taking Ibuprofen 2 a day. He is taking liquid lidocaine 3/4 times a day. ?Besides pain, no nausea, vomiting. No neuropathy.Hearing is ok. ?No change in bowel or urinary habits. ?He had a mild fever 99.3 a couple days ago. He also had diarrhea.  He says he feels unwell.  No spontaneous bleeding reported. ? ?Rest of the pertinent 10  ROS reviewed and negative ? ?Patient Active Problem List  ? Diagnosis Date Noted  ? Fever 01/31/2022  ? Port-A-Cath in place 01/28/2022  ? Weight loss, unintentional 12/30/2021  ? Thrombocytopenia ([place]) 12/22/2021  ? Hypomagnesemia 12/22/2021  ? Mucositis due to antineoplastic therapy 12/15/2021  ? Intractable hiccups 12/01/2021  ? Chemotherapy induced nausea and vomiting 12/01/2021  ? Cancer related pain 11/16/2021  ? Encounter for preoperative dental examination 10/29/2021  ? Caries 10/29/2021  ? Teeth missing 10/29/2021  ? Chronic periodontitis 10/29/2021  ? Accretions on teeth 10/29/2021  ? 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 Excessive dental attrition 10/29/2021  ? Gingival recession, generalized 10/29/2021  ? Phobia of dental procedure 10/29/2021  ? 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 ([name] [name]) 10/26/2021  ? Squamous cell carcinoma of left tonsil ([name] [name]) 10/25/2021  ? Avascular necrosis of hip, right ([name] [name]) 05/03/2018  ? Tobacco abuse 12/12/2011  ? Cellulitis of second finger of right hand 05/18/2011  ? Cellulitis and abscess of finger 05/18/2011  ? Cellulitis of hand 05/18/2011  ? Cellulitis of index finger 05/18/2011  ? Snake bite poisoning 05/17/2011  ? Swelling of  second finger of right hand 05/17/2011  ? Pain in finger of right hand 05/17/2011  ? HTN (hypertension) 05/17/2011  ? Hyperlipidemia 05/17/2011  ? FATTY LIVER DISEASE 12/04/2008  ? ABDOMINAL PAIN-RUQ 12/04/2008  ? ? ?is allergic to codeine. ? ?MEDICAL HISTORY: ?Past Medical History:  ?Diagnosis Date  ? Acute kidney  failure ([place]) 04/2018  ? CKD (chronic kidney disease), stage II   ? Complication of anesthesia   ? DDD (degenerative disc disease), lumbar   ? History of pancreatitis   ? 20-30 years ago  ? History of venomous snake bite   ? rt index finger-  ? Hypercholesterolemia   ? Hypertension   ? Nerve pain   ? Right leg, resolved with stimulator  ? PONV (postoperative nausea and vomiting)   ? S/P insertion of spinal cord stimulator 08/2017  ? Spinal stenosis   ? Tobacco abuse   ? ? ?SURGICAL HISTORY: ?Past Surgical History:  ?Procedure Laterality Date  ? COLONOSCOPY    ? FINGER EXPLORATION  05/2011  ? rt index finger [id number]  ? HERNIA REPAIR    ? as a child  ? I & D EXTREMITY  11/24/2011  ? Procedure: IRRIGATION AND DEBRIDEMENT EXTREMITY;  Surgeon: Tennis Must, MD;  Location: [facility];  Service: Orthopedics;  Laterality: Right;  right index  ? IR IMAGING GUIDED PORT INSERTION  11/17/2021  ? IR US GUIDE VASC ACCESS RIGHT  11/17/2021  ? MASS EXCISION  01/23/2012  ? Procedure: EXCISION MASS;  Surgeon: Tennis Must, MD;  Location: [facility];  Service: Orthopedics;  Laterality: Right;  Right index a-cell graft  ? TOTAL HIP ARTHROPLASTY Right 05/03/2018  ? Procedure: RIGHT TOTAL HIP ARTHROPLASTY ANTERIOR APPROACH;  Surgeon: Rod Can, MD;  Location: [facility];  Service: Orthopedics;  Laterality: Right;  Needs RNFA  ? ? ?SOCIAL HISTORY: ?Social History  ? ?Socioeconomic History  ? Marital status: Married  ?  Spouse name: Not on file  ? Number of children: Not on file  ? Years of education: Not on file  ? Highest education level: Not on file  ?Occupational History  ? Not on file  ?Tobacco Use  ? Smoking status: Every Day  ?  Packs/day: 0.50  ?  Years: 20.00  ?  Pack years: 10.00  ?  Types: Cigarettes  ? Smokeless tobacco: Never  ?Vaping Use  ? Vaping Use: Never used  ?Substance and Sexual Activity  ? Alcohol use: Yes  ?  Comment: occasional  ? Drug use: No  ? Sexual activity: Not Currently  ?Other  Topics Concern  ? Not on file  ?Social History Narrative  ? Not on file  ? ?Social Determinants of Health  ? ?Financial Resource Strain: Low Risk   ? Difficulty of Paying Living Expenses: Not hard at all  ?Food Insecurity: No Food Insecurity  ? Worried About Charity fundraiser in the Last Year: Never true  ? Ran Out of Food in the Last Year: Never true  ?Transportation Needs: No Transportation Needs  ? Lack of Transportation (Medical): No  ? Lack of Transportation (Non-Medical): No  ?Physical Activity: Not on file  ?Stress: Not on file  ?Social Connections: Not on file  ?Intimate Partner Violence: Not on file  ? ? ?FAMILY HISTORY: ?Family History  ?Problem Relation Age of Onset  ? Diabetes Mother   ? Throat cancer Brother   ? ? ? ?PHYSICAL EXAMINATION ? ?ECOG PERFORMANCE STATUS: 1 - Symptomatic but  completely ambulatory ? ?Physical Exam ?Constitutional:   ?   General: He is not in acute distress. ?   Appearance: Normal appearance. He is not toxic-appearing.  ?HENT:  ?   Head: Normocephalic and atraumatic.  ?   Mouth/Throat:  ?   Mouth: Mucous membranes are moist.  ?   Pharynx: Posterior oropharyngeal erythema (Erythema posterior oropharynx with ulceration) present. No oropharyngeal exudate.  ?Eyes:  ?   General: No scleral icterus. ?Cardiovascular:  ?   Rate and Rhythm: Normal rate and regular rhythm.  ?   Pulses: Normal pulses.  ?   Heart sounds: Normal heart sounds.  ?Pulmonary:  ?   Effort: Pulmonary effort is normal.  ?   Breath sounds: Normal breath sounds.  ?Abdominal:  ?   General: Abdomen is flat. Bowel sounds are normal. There is no distension.  ?   Palpations: Abdomen is soft.  ?   Tenderness: There is no abdominal tenderness.  ?Musculoskeletal:     ?   General: No swelling.  ?   Cervical back: Neck supple.  ?Lymphadenopathy:  ?   Cervical: No cervical adenopathy.  ?Skin: ?   General: Skin is warm and dry.  ?   Findings: Rash (Radiation changes) present.  ?Neurological:  ?   General: No focal deficit  present.  ?   Mental Status: He is alert.  ?Psychiatric:     ?   Mood and Affect: Mood normal.     ?   Behavior: Behavior normal.  ? ? ?LABORATORY DATA: ? ?CBC ?   ?Component Value Date/Time  ? WBC 4.0 05/

## 2022-01-31 NOTE — Assessment & Plan Note (Signed)
Patient reports fever of 99.3 degrees for the past 2 days.  He has also been taking ibuprofen twice a day for pain which might have Mast higher temperatures.  He is not leukopenic today.  Nose clear source of infections besides severe throat pain.  We have discussed about sending empirical antibiotics for possible throat infection, sent Augmentin, patient denied any allergy to this medication.  I have also asked for blood cultures, urine analysis and chest x-ray for further investigation but patient declined chest x-ray.  He is agreeable to trying the blood cultures and the urine analysis.  He was clearly instructed to go to the nearest hospital with any worsening symptoms and he acknowledged. ?

## 2022-01-31 NOTE — Assessment & Plan Note (Signed)
This is a very pleasant 66 year old male patient, current everyday smoker with newly diagnosed squamous cell carcinoma of the left tonsil, p16 negative, staging T1 versus T2, borderline size, and N2 with bilateral cervical lymphadenopathy which are hypermetabolic on PET imaging and M0 with no evidence of distant metastasis referred to medical oncology for consideration of concurrent chemotherapy and radiation.   ?Given bilateral cervical lymphadenopathy, we have discussed about considering concurrent chemotherapy and radiation as frontline treatment.  ?He went to [facility] for a second opinion about role of surgery. He decided to proceed with CRT locally after talking to surgery team. ? ?Cycle 1 day 1 of chemotherapy started on 11/24/2021 ?Cycle 2-day 1 on 12/02/2021 ?Cycle 3-day 1 on 12/09/2021 ?Cycle 4-day 1 12/16/2021 ?Cycle 5-day 1 delayed by 1 week given thrombocytopenia. ?Cycle 6-day 1 completed 01/06/2022. ?He continues to recover slowly.  He has severe throat pain, does not want to take Percocet and has been taking ibuprofen twice a day.  He is reluctant to try anything else for pain besides ibuprofen and left viscous lidocaine. ?We will see him back in about 2 weeks. ?

## 2022-01-31 NOTE — Assessment & Plan Note (Signed)
Patient declines to take any narcotics.  He only wants to continue ibuprofen twice a day and viscous lidocaine.  We have discussed about risk of gastritis with long-term use of ibuprofen.  He is still very reluctant to try taking narcotics. ?

## 2022-01-31 NOTE — Assessment & Plan Note (Signed)
Thrombocytopenia with platelet count of 35,000.  No evidence of spontaneous bleeding.  We have discussed about the risk of spontaneous bleeding and he understands to go to the nearest hospital with any severe complaints of bleeding. ?

## 2022-02-02 ENCOUNTER — Telehealth: Payer: Self-pay | Admitting: *Deleted

## 2022-02-02 NOTE — Telephone Encounter (Addendum)
-----   Message from Benay Pike, MD sent at 01/31/2022 10:02 PM EDT ----- ?His labs resulted after he left. ?Magnesium mildly low, please encourage mag rich foods ?I would like to see him back in 2 weeks or Mendel Ryder can with repeat CBC to make sure he is feeling better and thrombocytopenia improves ?Also, I am not sure if he had a chance to pick his abx yesterday and start taking them. ? ?This RN spoke with pt who states he is overall feeling well- has had no further fevers  -no issues with bruising or bleeding. He will restart his magnesium tablet as well. ? ?He has not picked up the antibiotic due to " the pharmacy didn't call me " he will pick it up now and start it. ? ?Per phone discussion- pt verbalized understanding to call for any further concerns ? ?

## 2022-02-05 LAB — CULTURE, BLOOD (SINGLE)
Culture: NO GROWTH
Culture: NO GROWTH
Special Requests: ADEQUATE

## 2022-02-14 ENCOUNTER — Inpatient Hospital Stay: Payer: No Typology Code available for payment source

## 2022-02-14 ENCOUNTER — Inpatient Hospital Stay: Payer: No Typology Code available for payment source | Admitting: Adult Health

## 2022-02-14 ENCOUNTER — Ambulatory Visit: Payer: No Typology Code available for payment source

## 2022-02-15 ENCOUNTER — Other Ambulatory Visit: Payer: Self-pay

## 2022-02-15 ENCOUNTER — Inpatient Hospital Stay: Payer: No Typology Code available for payment source

## 2022-02-15 ENCOUNTER — Encounter: Payer: Self-pay | Admitting: Radiation Oncology

## 2022-02-15 ENCOUNTER — Ambulatory Visit
Admission: RE | Admit: 2022-02-15 | Discharge: 2022-02-15 | Disposition: A | Discharge status: Home / Self Care | Payer: No Typology Code available for payment source | Source: Ambulatory Visit | Attending: Radiation Oncology | Admitting: Radiation Oncology

## 2022-02-15 VITALS — BP 106/71 | HR 65 | Temp 97.7°F | Resp 18 | Ht 68.0 in | Wt 154.0 lb

## 2022-02-15 DIAGNOSIS — C099 Malignant neoplasm of tonsil, unspecified: Secondary | ICD-10-CM

## 2022-02-15 DIAGNOSIS — C103 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: Secondary | ICD-10-CM | POA: Insufficient documentation

## 2022-02-15 DIAGNOSIS — Z79899 Other long term (current) drug therapy: Secondary | ICD-10-CM | POA: Insufficient documentation

## 2022-02-15 DIAGNOSIS — F5103 Paradoxical insomnia: Secondary | ICD-10-CM | POA: Insufficient documentation

## 2022-02-15 DIAGNOSIS — Z95828 Presence of other vascular implants and grafts: Secondary | ICD-10-CM

## 2022-02-15 LAB — CBC WITH DIFFERENTIAL (CANCER CENTER ONLY)
Abs Immature Granulocytes: 0.01 10*3/uL (ref 0.00–0.07)
Basophils Absolute: 0 10*3/uL (ref 0.0–0.1)
Basophils Relative: 0 %
Eosinophils Absolute: 0.1 10*3/uL (ref 0.0–0.5)
Eosinophils Relative: 2 %
HCT: 29.1 % — ABNORMAL LOW (ref 39.0–52.0)
Hemoglobin: 10.2 g/dL — ABNORMAL LOW (ref 13.0–17.0)
Immature Granulocytes: 0 %
Lymphocytes Relative: 27 %
Lymphs Abs: 0.7 10*3/uL (ref 0.7–4.0)
MCH: 36.3 pg — ABNORMAL HIGH (ref 26.0–34.0)
MCHC: 35.1 g/dL (ref 30.0–36.0)
MCV: 103.6 fL — ABNORMAL HIGH (ref 80.0–100.0)
Monocytes Absolute: 0.8 10*3/uL (ref 0.1–1.0)
Monocytes Relative: 32 %
Neutro Abs: 1 10*3/uL — ABNORMAL LOW (ref 1.7–7.7)
Neutrophils Relative %: 39 %
Platelet Count: 156 10*3/uL (ref 150–400)
RBC: 2.81 MIL/uL — ABNORMAL LOW (ref 4.22–5.81)
RDW: 17.2 % — ABNORMAL HIGH (ref 11.5–15.5)
WBC Count: 2.6 10*3/uL — ABNORMAL LOW (ref 4.0–10.5)
nRBC: 0 % (ref 0.0–0.2)

## 2022-02-15 LAB — COMPREHENSIVE METABOLIC PANEL
ALT: 30 U/L (ref 0–44)
AST: 32 U/L (ref 15–41)
Albumin: 3.5 g/dL (ref 3.5–5.0)
Alkaline Phosphatase: 73 U/L (ref 38–126)
Anion gap: 3 — ABNORMAL LOW (ref 5–15)
BUN: 11 mg/dL (ref 8–23)
CO2: 28 mmol/L (ref 22–32)
Calcium: 8.6 mg/dL — ABNORMAL LOW (ref 8.9–10.3)
Chloride: 102 mmol/L (ref 98–111)
Creatinine, Ser: 0.7 mg/dL (ref 0.61–1.24)
GFR, Estimated: >60 mL/min (ref >60)
Glucose, Bld: 309 mg/dL — ABNORMAL HIGH (ref 70–99)
Potassium: 3.7 mmol/L (ref 3.5–5.1)
Sodium: 133 mmol/L — ABNORMAL LOW (ref 135–145)
Total Bilirubin: 0.7 mg/dL (ref 0.3–1.2)
Total Protein: 5.7 g/dL — ABNORMAL LOW (ref 6.5–8.1)

## 2022-02-15 LAB — MAGNESIUM: Magnesium: 1.7 mg/dL (ref 1.7–2.4)

## 2022-02-15 MED ORDER — ZOLPIDEM TARTRATE 5 MG PO TABS: 5.0000 mg | ORAL_TABLET | Freq: Every evening | ORAL | 0 refills | Status: DC | PRN

## 2022-02-15 MED ORDER — SODIUM CHLORIDE 0.9% FLUSH
10.0000 mL | Freq: Once | INTRAVENOUS | Status: AC
  Administered 2022-02-15: 10 mL

## 2022-02-15 MED ORDER — HEPARIN SOD (PORK) LOCK FLUSH 100 UNIT/ML IV SOLN
500.0000 [IU] | Freq: Once | INTRAVENOUS | Status: AC
  Administered 2022-02-15: 500 [IU]

## 2022-02-15 NOTE — Progress Notes (Signed)
?Radiation Oncology         [phone] ?________________________________ ? ?Name: [name] [name] MRN: [id number]  ?Date: 02/15/2022  DOB: 07-01-1956 ? ?Follow-Up Visit Note ? ?CC: [name], MD  Leta Baptist, MD ? ?Diagnosis and Prior Radiotherapy:     ?  ICD-10-CM   ?1. 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  ?2. Paradoxical insomnia  F51.03 zolpidem (AMBIEN) 5 MG tablet  ?  ? ? 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 ?Squamous cell carcinoma of left tonsil (HCC) ?Staging form: Pharynx - P16 Negative Oropharynx, AJCC 8th Edition ?- Clinical stage from 10/26/2021: Stage IVA (cT2, cN2c, cM0, p16-) - Signed by Eppie Gibson, MD on 10/26/2021 ?Stage prefix: Initial diagnosis ? ? ?CHIEF COMPLAINT:  Here for follow-up and surveillance of throat cancer ? ?Narrative:  The patient returns today for routine follow-up.  [name] [name] presents today for follow-up after completing radiation to his left tonsil on 01/11/2022 ? ?Pain issues, if any: Overall reports throat pain has improved greatly Does report a residual soreness to the left side of his throat (similar to when he cancer was initially diagnosed) ?Using a feeding tube?: N/A ?Weight changes, if any:  ?Wt Readings from Last 3 Encounters:  ?02/15/22 154 lb (69.9 kg)  ?01/31/22 150 lb (68 kg)  ?01/06/22 151 lb 12 oz (68.8 kg)  ? ?Swallowing issues, if any: Denies any issues. Reports he can eat and drink a wide varitey, he just is not able to taste anything ?Smoking or chewing tobacco? None ?Using fluoride trays daily? N/A--denies any tender or sore spots in his mouth.  ?Last ENT visit was on: Not since diagnosis ?Other notable issues, if any: Had F/U with his medical oncologist Dr. Chryl Heck on 01/31/2022.  Reports he stayed in the bed several days last week due to severe fatigue, but it has gradually improved. Denies any swelling to his neck. Skin appears intact and well healed in treatment field.  ? ?                   ? ?ALLERGIES:  is allergic to codeine. ? ?Meds: ?Current Outpatient Medications  ?Medication Sig Dispense Refill  ? zolpidem (AMBIEN) 5 MG tablet Take 1 tablet (5 mg total) by mouth at bedtime as needed for sleep. 30 tablet 0  ? amoxicillin-clavulanate (AUGMENTIN) 875-125 MG tablet Take 1 tablet by mouth 2 (two) times daily. 14 tablet 0  ? chlorproMAZINE (THORAZINE) 10 MG tablet TAKE 1 TABLET(10 MG) BY MOUTH THREE TIMES DAILY AS NEEDED 10 tablet 0  ? dexamethasone (DECADRON) 4 MG tablet Take 2 tablets (8 mg total) by mouth daily. Take daily x 3 days starting the day after cisplatin chemotherapy. Take with food. 30 tablet 1  ? lidocaine (XYLOCAINE) 2 % solution Patient: Mix 1part 2% viscous lidocaine, 1part H20. Swish & swallow 48m of diluted mixture, 360m before meals and at bedtime, up to QID 200 mL 3  ? lidocaine-prilocaine (EMLA) cream Apply to affected area once 30 g 3  ? LORazepam (ATIVAN) 0.5 MG tablet Take 1 tablet (0.5 mg total) by mouth every 6 (six) hours as needed (Nausea or vomiting). 30 tablet 0  ? Multiple Vitamins-Minerals (CENTRUM ULTRA MENS) TABS Take 1 tablet by mouth  daily.      ? olmesartan-hydrochlorothiazide (BENICAR HCT) 40-25 MG per tablet Take 1 tablet by mouth daily.      ? ondansetron (ZOFRAN) 8 MG tablet Take 1 tablet (8 mg total) by mouth 2 (two) times daily as needed. Start on the third day after cisplatin chemotherapy. 30 tablet 1  ? rosuvastatin (CRESTOR) 10 MG tablet Take 1 tablet (10 mg total) by mouth daily. 90 tablet 0  ? ?No current facility-administered medications for this encounter.  ? ? ?Physical Findings: ?The patient is in no acute distress. Patient is alert and oriented. ?Wt Readings from Last 3 Encounters:  ?02/15/22 154 lb (69.9 kg)  ?01/31/22 150 lb (68  kg)  ?01/06/22 151 lb 12 oz (68.8 kg)  ? ? height is '5\' 8"'$  (1.727 m) and weight is 154 lb (69.9 kg). His temporal temperature is 97.7 ?F (36.5 ?C). His blood pressure is 106/71 and his pulse is 65. His respiration is 18 and oxygen saturation is 100%. Marland Kitchen  ?General: Alert and oriented, in no acute distress ?HEENT: Head is normocephalic. Extraocular movements are intact. Oropharynx is notable for no visible tumor or thrush or mucositis ?Neck: Neck is notable for slightly dry hyperpigmented skin.  No palpable masses. ?Skin: Skin in treatment fields shows satisfactory healing as above ?Lymphatics: see Neck Exam ?Psychiatric: Judgment and insight are intact. Affect is appropriate. ? ? ?Lab Findings: ?Lab Results  ?Component Value Date  ? WBC 2.6 (L) 02/15/2022  ? HGB 10.2 (L) 02/15/2022  ? HCT 29.1 (L) 02/15/2022  ? MCV 103.6 (H) 02/15/2022  ? PLT 156 02/15/2022  ? ? ?Lab Results  ?Component Value Date  ? TSH 1.099 11/24/2021  ? ? ?Radiographic Findings: ?No results found. ? ?Impression/Plan:   ? ?1) Head and Neck Cancer Status: Healing from chemoradiotherapy ? ?2) Nutritional Status: Continue following the instructions of nutritionist.  Weight is stabilizing ?Wt Readings from Last 3 Encounters:  ?02/15/22 154 lb (69.9 kg)  ?01/31/22 150 lb (68 kg)  ?01/06/22 151 lb 12 oz (68.8 kg)  ?  ? ?3) Risk Factors: The patient has been educated about risk factors including alcohol and tobacco abuse; they understand that avoidance of alcohol and tobacco is important to prevent recurrences as well as other cancers.  We spent quite a bit of time discussing this.  He has not been drinking alcohol or smoking recently and I urged him never to resume.  He demonstrates good understanding of this ? ?4) Swallowing: Functional, continue following advice of swallowing therapist ? ?5) Dental: Encouraged to continue regular followup with dentistry, and dental hygiene including fluoride rinses.  ? ?6) Thyroid function: Check annually with medical  oncology ?Lab Results  ?Component Value Date  ? TSH 1.099 11/24/2021  ? ? ?7) advised him to apply vitamin E lotion to neck twice a day for at least another month to help the skin heal ? ?8)  follow-up in July with restaging PET. The patient was encouraged to call with any issues or questions before then. ? ?On date of service, in total, I spent 30 minutes on this encounter. Patient was seen in person. ?_____________________________________ ? ? ?Eppie Gibson, MD ? ?

## 2022-02-15 NOTE — Progress Notes (Signed)
Oncology Nurse Navigator Documentation  ? ?I met with [name] [name] during his post treatment follow up appointment with Dr. Isidore Moos today. He is improving after receiving chemo/radiation for his head and neck cancer. He tells that he is eating most all foods but reports that his taste is still altered. I will set up a post treatment PET scan in July and schedule an appointment for him to see Dr. Isidore Moos for results.  ? ?Harlow Asa RN, BSN, OCN ?Head & Neck Oncology Nurse Navigator ?[place] [facility] ?Phone # [phone]  ?Fax # [phone]  ?

## 2022-02-15 NOTE — Progress Notes (Signed)
[name] [name] presents today for follow-up after completing radiation to his left tonsil on 01/11/2022 ? ?Pain issues, if any: Overall reports throat pain has improved greatly Does report a residual soreness to the left side of his throat (similar to when he cancer was initially diagnosed) ?Using a feeding tube?: N/A ?Weight changes, if any:  ?Wt Readings from Last 3 Encounters:  ?02/15/22 154 lb (69.9 kg)  ?01/31/22 150 lb (68 kg)  ?01/06/22 151 lb 12 oz (68.8 kg)  ? ?Swallowing issues, if any: Denies any issues. Reports he can eat and drink a wide varitey, he just is not able to taste anything ?Smoking or chewing tobacco? None ?Using fluoride trays daily? N/A--denies any tender or sore spots in his mouth.  ?Last ENT visit was on: Not sinec diagnosis ?Other notable issues, if any: Had F/U with his medical oncologist Dr. Chryl Heck on 01/31/2022. Reports he stayed in the bed several days last week due to severe fatigue, but it has gradually improved. Denies any swelling to his neck. Skin appears intact and well healed in treatment field.  ? ? ? ?

## 2022-02-16 ENCOUNTER — Telehealth: Payer: Self-pay | Admitting: *Deleted

## 2022-02-16 ENCOUNTER — Encounter: Payer: Self-pay | Admitting: Hematology and Oncology

## 2022-02-16 NOTE — Telephone Encounter (Signed)
Called patient to inform of appt. with [facility] on 02-24-22 - arrival time- 10:30 am with NP- [name] [name] Monday, spoke with patient and he is aware of this appt. ?

## 2022-02-17 ENCOUNTER — Telehealth: Payer: Self-pay | Admitting: Nutrition

## 2022-02-17 NOTE — Telephone Encounter (Signed)
Contacted patient by telephone for nutrition follow-up status post radiation therapy and chemotherapy for left tonsil cancer.  Weight improved and documented as 154 pounds on May 16 increased from 151 pounds April 6. Patient reports he eats anything that he likes.  Food still does not have much taste and he states everything tastes the same.  Reports a little bit of thickened saliva.  States he drinks plenty of water.  He is pleased with weight gain.  Denies difficulty chewing or swallowing and is eating pizza and steak without difficulty.  He does not feel as if he needs oral nutrition supplements anymore since he is eating better however he is going to finish what he has purchased.  Nutrition diagnosis: Inadequate oral intake improved.  Intervention: Provided support and encouragement to continue strategies for increased calorie and protein intake. Agreed to continue oral nutrition supplements until he runs out of them. Monitor weight and increase oral intake if weight loss resumes.  Monitoring, evaluation, goals: Patient will continue to tolerate oral intake for weight gain/weight maintenance.  Patient agrees to contact RD for questions or concerns.  Please refer back to RD if needed.

## 2022-02-24 ENCOUNTER — Ambulatory Visit: Payer: No Typology Code available for payment source | Admitting: Family Medicine

## 2022-03-02 ENCOUNTER — Other Ambulatory Visit: Payer: Self-pay

## 2022-03-02 ENCOUNTER — Inpatient Hospital Stay (HOSPITAL_BASED_OUTPATIENT_CLINIC_OR_DEPARTMENT_OTHER): Payer: No Typology Code available for payment source | Admitting: Hematology and Oncology

## 2022-03-02 ENCOUNTER — Encounter: Payer: Self-pay | Admitting: Hematology and Oncology

## 2022-03-02 ENCOUNTER — Inpatient Hospital Stay: Payer: No Typology Code available for payment source

## 2022-03-02 DIAGNOSIS — C099 Malignant neoplasm of tonsil, unspecified: Secondary | ICD-10-CM | POA: Diagnosis not present

## 2022-03-02 DIAGNOSIS — Z95828 Presence of other vascular implants and grafts: Secondary | ICD-10-CM

## 2022-03-02 LAB — COMPREHENSIVE METABOLIC PANEL
ALT: 31 U/L (ref 0–44)
AST: 34 U/L (ref 15–41)
Albumin: 3.9 g/dL (ref 3.5–5.0)
Alkaline Phosphatase: 60 U/L (ref 38–126)
Anion gap: 5 (ref 5–15)
BUN: 18 mg/dL (ref 8–23)
CO2: 23 mmol/L (ref 22–32)
Calcium: 9.1 mg/dL (ref 8.9–10.3)
Chloride: 105 mmol/L (ref 98–111)
Creatinine, Ser: 0.4 mg/dL — ABNORMAL LOW (ref 0.61–1.24)
GFR, Estimated: >60 mL/min (ref >60)
Glucose, Bld: 109 mg/dL — ABNORMAL HIGH (ref 70–99)
Potassium: 3.8 mmol/L (ref 3.5–5.1)
Sodium: 133 mmol/L — ABNORMAL LOW (ref 135–145)
Total Bilirubin: 0.6 mg/dL (ref 0.3–1.2)
Total Protein: 6.5 g/dL (ref 6.5–8.1)

## 2022-03-02 LAB — CBC WITH DIFFERENTIAL (CANCER CENTER ONLY)
Abs Immature Granulocytes: 0.02 10*3/uL (ref 0.00–0.07)
Basophils Absolute: 0 10*3/uL (ref 0.0–0.1)
Basophils Relative: 1 %
Eosinophils Absolute: 0.2 10*3/uL (ref 0.0–0.5)
Eosinophils Relative: 4 %
HCT: 32.7 % — ABNORMAL LOW (ref 39.0–52.0)
Hemoglobin: 11.4 g/dL — ABNORMAL LOW (ref 13.0–17.0)
Immature Granulocytes: 1 %
Lymphocytes Relative: 18 %
Lymphs Abs: 0.8 10*3/uL (ref 0.7–4.0)
MCH: 36.5 pg — ABNORMAL HIGH (ref 26.0–34.0)
MCHC: 34.9 g/dL (ref 30.0–36.0)
MCV: 104.8 fL — ABNORMAL HIGH (ref 80.0–100.0)
Monocytes Absolute: 1 10*3/uL (ref 0.1–1.0)
Monocytes Relative: 23 %
Neutro Abs: 2.4 10*3/uL (ref 1.7–7.7)
Neutrophils Relative %: 53 %
Platelet Count: 153 10*3/uL (ref 150–400)
RBC: 3.12 MIL/uL — ABNORMAL LOW (ref 4.22–5.81)
RDW: 14.6 % (ref 11.5–15.5)
WBC Count: 4.3 10*3/uL (ref 4.0–10.5)
nRBC: 0 % (ref 0.0–0.2)

## 2022-03-02 LAB — MAGNESIUM: Magnesium: 2 mg/dL (ref 1.7–2.4)

## 2022-03-02 MED ORDER — HEPARIN SOD (PORK) LOCK FLUSH 100 UNIT/ML IV SOLN
500.0000 [IU] | Freq: Once | INTRAVENOUS | Status: AC
  Administered 2022-03-02: 500 [IU]

## 2022-03-02 MED ORDER — SODIUM CHLORIDE 0.9% FLUSH
10.0000 mL | Freq: Once | INTRAVENOUS | Status: AC
  Administered 2022-03-02: 10 mL

## 2022-03-02 NOTE — Assessment & Plan Note (Signed)
This is a very pleasant 66 year old male patient, current everyday smoker with newly diagnosed squamous cell carcinoma of the left tonsil, p16 negative, staging T1 versus T2, borderline size, and N2 with bilateral cervical lymphadenopathy which are hypermetabolic on PET imaging and M0 with no evidence of distant metastasis referred to medical oncology for consideration of concurrent chemotherapy and radiation.   Given bilateral cervical lymphadenopathy, we have discussed about considering concurrent chemotherapy and radiation as frontline treatment.  He went to [facility] for a second opinion about role of surgery. He decided to proceed with CRT locally after talking to surgery team.  Cycle 1 day 1 of chemotherapy started on 11/24/2021 Cycle 2-day 1 on 12/02/2021 Cycle 3-day 1 on 12/09/2021 Cycle 4-day 1 12/16/2021 Cycle 5-day 1 delayed by 1 week given thrombocytopenia. Cycle 6-day 1 completed 01/06/2022.  He is continuing to heal well. He continues to have severe dysguesia, but overall recovering. CBC shows improvement in leukopenia, now resolved, anemia is improving Physical exam, no [place] cervical lymphadenopathy, left oropharynx has a white necrotic patch likely tumor necrosis Anticipate EOT PET scan in July FU with Dr Isidore Moos after PET scan He will have to be referred to ENT  ( prefers [facility]) for surveillance RTC in 6 months from now to see me.

## 2022-03-02 NOTE — Progress Notes (Signed)
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 ([place]) Staging form: Pharynx - P16 Negative Oropharynx, AJCC 8th Edition - Clinical stage from 10/26/2021: Stage IVA (cT2, cN2c, cM0, p16-) - Signed by Eppie Gibson, MD on 10/26/2021 Stage prefix: Initial diagnosis  Squamous cell carcinoma of left tonsil (HCC) Staging form: Pharynx - P16 Negative Oropharynx, AJCC 8th Edition - Clinical stage from 10/26/2021: Stage IVA (cT2, cN2c, cM0, p16-) - Signed by Eppie Gibson, MD on 10/26/2021 Stage prefix: Initial diagnosis   SUMMARY OF ONCOLOGIC HISTORY: Oncology History  Squamous cell carcinoma of left tonsil ([place])  10/25/2021 Initial Diagnosis   Squamous cell carcinoma of left tonsil ([place])    10/26/2021 Cancer Staging   Staging form: Pharynx - P16 Negative Oropharynx, AJCC 8th Edition - Clinical stage from 10/26/2021: Stage IVA (cT2, cN2c, cM0, p16-) - Signed by Eppie Gibson, MD on 10/26/2021 Stage prefix: Initial diagnosis    11/25/2021 -  Chemotherapy   Patient is on Treatment Plan : HEAD/NECK Cisplatin q7d      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  He began chemotherapy with weekly cisplatin on November 25, 2021. He had 6 weekly cycles of cisplatin on 01/06/2022. He completed radiation on 01/11/2022 He is here for  follow up. He says he is trying to eat as much as possible but nothing tastes good. He is only able to eat some ice cream, chicken noodle soup, fish and sea food tastes better than other foods. Pain is in the left tonsil, manageable. Pain doesn't interfere with swallowing. Anticipate PET scan in mid July. No change in bowel habits or urinary habits. No hearing changes, No neuropathy. Rest of the pertinent 10  ROS reviewed and negative  Patient Active Problem List   Diagnosis Date Noted   Fever 01/31/2022   Port-A-Cath in place 01/28/2022   Weight loss, unintentional 12/30/2021   Thrombocytopenia ([place]) 12/22/2021   Hypomagnesemia 12/22/2021   Mucositis due to antineoplastic therapy 12/15/2021   Intractable hiccups 12/01/2021   Chemotherapy induced nausea and vomiting 12/01/2021   Cancer related pain 11/16/2021   Encounter for preoperative dental examination 10/29/2021   C[place] 10/29/2021   Teeth missing 10/29/2021   Chronic periodontitis 10/29/2021   Accretions on teeth 10/29/2021   Torus mandibularis 10/29/2021   Excessive dental attrition 10/29/2021   Gingival recession, generalized 10/29/2021   Phobia of dental procedure 10/29/2021   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 ([place]) 10/26/2021   Squamous cell carcinoma of left tonsil ([place]) 10/25/2021   Avascular necrosis of hip, right ([place]) 05/03/2018   Tobacco abuse 12/12/2011   Cellulitis of second finger of right hand 05/18/2011   Cellulitis and abscess of finger 05/18/2011   Cellulitis of hand 05/18/2011   Cellulitis of index finger 05/18/2011   Snake bite poisoning 05/17/2011   Swelling of second finger of right  hand 05/17/2011   Pain in finger of right hand 05/17/2011   HTN (hypertension) 05/17/2011   Hyperlipidemia 05/17/2011   FATTY LIVER DISEASE 12/04/2008   ABDOMINAL PAIN-RUQ 12/04/2008    is allergic to codeine.  MEDICAL HISTORY: Past Medical History:  Diagnosis Date   Acute kidney failure ([place])  04/2018   CKD (chronic kidney disease), stage II    Complication of anesthesia    DDD (degenerative disc disease), lumbar    History of pancreatitis    20-30 years ago   History of venomous snake bite    rt index finger-   Hypercholesterolemia    Hypertension    Nerve pain    Right leg, resolved with stimulator   PONV (postoperative nausea and vomiting)    S/P insertion of spinal cord stimulator 08/2017   Spinal stenosis    Tobacco abuse     SURGICAL HISTORY: Past Surgical History:  Procedure Laterality Date   COLONOSCOPY     FINGER EXPLORATION  05/2011   rt index finger [id number]   HERNIA REPAIR     as a child   I & D EXTREMITY  11/24/2011   Procedure: IRRIGATION AND DEBRIDEMENT EXTREMITY;  Surgeon: Tennis Must, MD;  Location: [facility];  Service: Orthopedics;  Laterality: Right;  right index   IR IMAGING GUIDED PORT INSERTION  11/17/2021   IR US GUIDE VASC ACCESS RIGHT  11/17/2021   MASS EXCISION  01/23/2012   Procedure: EXCISION MASS;  Surgeon: Tennis Must, MD;  Location: [facility];  Service: Orthopedics;  Laterality: Right;  Right index a-cell graft   TOTAL HIP ARTHROPLASTY Right 05/03/2018   Procedure: RIGHT TOTAL HIP ARTHROPLASTY ANTERIOR APPROACH;  Surgeon: Rod Can, MD;  Location: [facility];  Service: Orthopedics;  Laterality: Right;  Needs RNFA    SOCIAL HISTORY: Social History   Socioeconomic History   Marital status: Married    Spouse name: Not on file   Number of children: Not on file   Years of education: Not on file   Highest education level: Not on file  Occupational History   Not on file  Tobacco Use   Smoking status: Every Day    Packs/day: 0.50    Years: 20.00    Pack years: 10.00    Types: Cigarettes   Smokeless tobacco: Never  Vaping Use   Vaping Use: Never used  Substance and Sexual Activity   Alcohol use: Yes    Comment: occasional   Drug use: No   Sexual activity: Not Currently  Other Topics  Concern   Not on file  Social History Narrative   Not on file   Social Determinants of Health   Financial Resource Strain: Low Risk    Difficulty of Paying Living Expenses: Not hard at all  Food Insecurity: No Food Insecurity   Worried About Charity fundraiser in the Last Year: Never true   Ran Out of Food in the Last Year: Never true  Transportation Needs: No Transportation Needs   Lack of Transportation (Medical): No   Lack of Transportation (Non-Medical): No  Physical Activity: Not on file  Stress: Not on file  Social Connections: Not on file  Intimate Partner Violence: Not on file    FAMILY HISTORY: Family History  Problem Relation Age of Onset   Diabetes Mother    Throat cancer Brother      PHYSICAL EXAMINATION  ECOG PERFORMANCE STATUS: 1 - Symptomatic but completely ambulatory  Physical  Exam Constitutional:      General: He is not in acute distress.    Appearance: Normal appearance. He is not toxic-appearing.  HENT:     Head: Normocephalic and atraumatic.     Mouth/Throat:     Mouth: Mucous membranes are moist.     Pharynx: Posterior oropharyngeal erythema (left oropharynx necrotic patch, appears pale, no exophytic masses or enlarged tonsils) present. No oropharyngeal exudate.  Eyes:     General: No scleral icterus. Cardiovascular:     Rate and Rhythm: Normal rate and regular rhythm.     Pulses: Normal pulses.     Heart sounds: Normal heart sounds.  Pulmonary:     Effort: Pulmonary effort is normal.     Breath sounds: Normal breath sounds.  Abdominal:     General: Abdomen is flat. Bowel sounds are normal. There is no distension.     Palpations: Abdomen is soft.     Tenderness: There is no abdominal tenderness.  Musculoskeletal:        General: No swelling.     Cervical back: Neck supple.  Lymphadenopathy:     Cervical: No cervical adenopathy.  Skin:    General: Skin is warm and dry.     Findings: Rash (Radiation skin changes have gotten better)  present.  Neurological:     General: No focal deficit present.     Mental Status: He is alert.  Psychiatric:        Mood and Affect: Mood normal.        Behavior: Behavior normal.    LABORATORY DATA:  CBC    Component Value Date/Time   WBC 4.3 03/02/2022 1420   WBC 7.1 12/01/2021 0910   RBC 3.12 (L) 03/02/2022 1420   HGB 11.4 (L) 03/02/2022 1420   HCT 32.7 (L) 03/02/2022 1420   PLT 153 03/02/2022 1420   MCV 104.8 (H) 03/02/2022 1420   MCH 36.5 (H) 03/02/2022 1420   MCHC 34.9 03/02/2022 1420   RDW 14.6 03/02/2022 1420   LYMPHSABS 0.8 03/02/2022 1420   MONOABS 1.0 03/02/2022 1420   EOSABS 0.2 03/02/2022 1420   BASOSABS 0.0 03/02/2022 1420    CMP     Component Value Date/Time   NA 133 (L) 02/15/2022 1439   K 3.7 02/15/2022 1439   CL 102 02/15/2022 1439   CO2 28 02/15/2022 1439   GLUCOSE 309 (H) 02/15/2022 1439   BUN 11 02/15/2022 1439   CREATININE 0.70 02/15/2022 1439   CREATININE 0.69 01/11/2022 1258   CALCIUM 8.6 (L) 02/15/2022 1439   PROT 5.7 (L) 02/15/2022 1439   ALBUMIN 3.5 02/15/2022 1439   AST 32 02/15/2022 1439   AST 72 (H) 10/25/2021 1053   ALT 30 02/15/2022 1439   ALT 48 (H) 10/25/2021 1053   ALKPHOS 73 02/15/2022 1439   BILITOT 0.7 02/15/2022 1439   BILITOT 1.0 10/25/2021 1053   GFRNONAA >60 02/15/2022 1439   GFRNONAA >60 01/11/2022 1258   GFRAA >60 05/04/2018 0536    ASSESSMENT and THERAPY PLAN:   Squamous cell carcinoma of left tonsil (HCC) This is a very pleasant 66 year old male patient, current everyday smoker with newly diagnosed squamous cell carcinoma of the left tonsil, p16 negative, staging T1 versus T2, borderline size, and N2 with bilateral cervical lymphadenopathy which are hypermetabolic on PET imaging and M0 with no evidence of distant metastasis referred to medical oncology for consideration of concurrent chemotherapy and radiation.   Given bilateral cervical lymphadenopathy, we have discussed about considering concurrent  chemotherapy and radiation as frontline treatment.  He went to [facility] for a second opinion about role of surgery. He decided to proceed with CRT locally after talking to surgery team.  Cycle 1 day 1 of chemotherapy started on 11/24/2021 Cycle 2-day 1 on 12/02/2021 Cycle 3-day 1 on 12/09/2021 Cycle 4-day 1 12/16/2021 Cycle 5-day 1 delayed by 1 week given thrombocytopenia. Cycle 6-day 1 completed 01/06/2022.  He is continuing to heal well. He continues to have severe dysguesia, but overall recovering. CBC shows improvement in leukopenia, now resolved, anemia is improving Physical exam, no [place] cervical lymphadenopathy, left oropharynx has a white necrotic patch likely tumor necrosis Anticipate EOT PET scan in July FU with Dr Isidore Moos after PET scan He will have to be referred to ENT  ( prefers [facility]) for surveillance RTC in 6 months from now to see me.   All questions were answered. The patient knows to call the clinic with any problems, questions or concerns. We can certainly see the patient much sooner if necessary.  Total encounter time: 30 minutes in face-to-face visit time, chart review, lab review, care coordination, order entry, and documentation of the encounter.   *Total Encounter Time as defined by the Centers for Medicare and Medicaid Services includes, in addition to the face-to-face time of a patient visit (documented in the note above) non-face-to-face time: obtaining and reviewing outside history, ordering and reviewing medications, tests or procedures, care coordination (communications with other health care professionals or caregivers) and documentation in the medical record.

## 2022-03-04 ENCOUNTER — Encounter: Payer: Self-pay | Admitting: *Deleted

## 2022-03-04 ENCOUNTER — Encounter: Payer: Self-pay | Admitting: Family Medicine

## 2022-03-04 ENCOUNTER — Ambulatory Visit (INDEPENDENT_AMBULATORY_CARE_PROVIDER_SITE_OTHER): Payer: No Typology Code available for payment source | Admitting: Family Medicine

## 2022-03-04 VITALS — BP 106/68 | HR 74 | Ht 68.0 in | Wt 154.0 lb

## 2022-03-04 DIAGNOSIS — C099 Malignant neoplasm of tonsil, unspecified: Secondary | ICD-10-CM

## 2022-03-04 DIAGNOSIS — I10 Essential (primary) hypertension: Secondary | ICD-10-CM | POA: Diagnosis not present

## 2022-03-04 NOTE — Progress Notes (Unsigned)
New Patient Office Visit  Subjective:  Patient ID: [name] [name], male    DOB: [birth year]  Age: 66 y.o. MRN: [id number]  CC:  Chief Complaint  Patient presents with   New Patient (Initial Visit)    Establish care, recovering from radiation and chemo.    HPI [name] [name] is a 66 y.o. male with past medical history of HTN, Squamous cell carcinoma of the left tonsil, and Malignant neoplasm of posterior wall of the oropharynx presents for establishing care.  The patient is S/p concurrent chemotherapy and radiation for squamous cell carcinoma of the left tonsil. He had chemo and radiation from early Feb 2023- April 2023, with 8 weeks of radiation and chem once weekly for 6 weeks. He reports doing well since treatment except for experiencing treatment side effects. He c/o of fatigue, dry mouth, thick saliva, ageusia, soreness on the left tonsils, and restlessness. Anticipate EOT PET scan in July; he will follow up with Dr Isidore Moos after PET scan on 04/20/22. The appointment is subject to change depending on PET scan  Sciatica nerve stimulator:  placed 4 years ago by Dr. Arnoldo Morale, his neurologist, to help with his back pain. He reports minimal benefit of the stimulator as he still experiences back pain. He reports having to sit down after 15 min of ambulation. rates pain 5-6/10.  Past Medical History:  Diagnosis Date   Acute kidney failure ([place]) 04/2018   CKD (chronic kidney disease), stage II    Complication of anesthesia    DDD (degenerative disc disease), lumbar    History of pancreatitis    20-30 years ago   History of venomous snake bite    rt index finger-   Hypercholesterolemia    Hypertension    Nerve pain    Right leg, resolved with stimulator   PONV (postoperative nausea and vomiting)    S/P insertion of spinal cord stimulator 08/2017   Spinal stenosis    Tobacco abuse     Past Surgical History:  Procedure Laterality Date   COLONOSCOPY     FINGER EXPLORATION  05/2011    rt index finger snakebitex5   HERNIA REPAIR     as a child   I & D EXTREMITY  11/24/2011   Procedure: IRRIGATION AND DEBRIDEMENT EXTREMITY;  Surgeon: Tennis Must, MD;  Location: [facility];  Service: Orthopedics;  Laterality: Right;  right index   IR IMAGING GUIDED PORT INSERTION  11/17/2021   IR US GUIDE VASC ACCESS RIGHT  11/17/2021   MASS EXCISION  01/23/2012   Procedure: EXCISION MASS;  Surgeon: Tennis Must, MD;  Location: [facility];  Service: Orthopedics;  Laterality: Right;  Right index a-cell graft   TOTAL HIP ARTHROPLASTY Right 05/03/2018   Procedure: RIGHT TOTAL HIP ARTHROPLASTY ANTERIOR APPROACH;  Surgeon: Rod Can, MD;  Location[facility];  Service: Orthopedics;  Laterality: Right;  Needs RNFA    Family History  Problem Relation Age of Onset   Diabetes Mother    Throat cancer Brother     Social History   Socioeconomic History   Marital status: Married    Spouse name: Not on file   Number of children: Not on file   Years of education: Not on file   Highest education level: Not on file  Occupational History   Not on file  Tobacco Use   Smoking status: Former    Packs/day: 0.50    Years: 20.00    Pack years:  10.00    Types: Cigarettes   Smokeless tobacco: Never  Vaping Use   Vaping Use: Never used  Substance and Sexual Activity   Alcohol use: Yes    Comment: occasional   Drug use: No   Sexual activity: Not Currently  Other Topics Concern   Not on file  Social History Narrative   Not on file   Social Determinants of Health   Financial Resource Strain: Low Risk    Difficulty of Paying Living Expenses: Not hard at all  Food Insecurity: No Food Insecurity   Worried About Charity fundraiser in the Last Year: Never true   [place] in the Last Year: Never true  Transportation Needs: No Transportation Needs   Lack of Transportation (Medical): No   Lack of Transportation (Non-Medical): No  Physical Activity: Not on  file  Stress: Not on file  Social Connections: Not on file  Intimate Partner Violence: Not on file    ROS Review of Systems  Constitutional:  Positive for fatigue. Negative for chills and fever.  HENT:  Negative for congestion, rhinorrhea, sinus pressure, sinus pain and sneezing.   Eyes:  Negative for pain, discharge and itching.  Respiratory:  Negative for cough, choking, chest tightness, shortness of breath and wheezing.   Cardiovascular:  Negative for chest pain, palpitations and leg swelling.  Gastrointestinal:  Negative for constipation, diarrhea, nausea and vomiting.  Endocrine: Negative for polydipsia, polyphagia and polyuria.  Genitourinary:  Negative for frequency and urgency.  Musculoskeletal:  Positive for back pain. Negative for neck pain.  Neurological:  Negative for dizziness, light-headedness and headaches.  Hematological:  Does not bruise/bleed easily.  Psychiatric/Behavioral:  Positive for sleep disturbance. Negative for self-injury and suicidal ideas.    Objective:   Today's Vitals: BP 106/68   Pulse 74   Ht '5\' 8"'$  (1.727 m)   Wt 154 lb (69.9 kg)   SpO2 98%   BMI 23.42 kg/m   Physical Exam HENT:     Head: Normocephalic and atraumatic.     Right Ear: External ear normal.     Left Ear: External ear normal.     Nose: No congestion or rhinorrhea.     Mouth/Throat:     Mouth: Mucous membranes are moist.     Tonsils: No tonsillar exudate.     Comments: No visible white patches seen on left tonsils Eyes:     Extraocular Movements: Extraocular movements intact.     Pupils: Pupils are equal, round, and reactive to light.  Cardiovascular:     Rate and Rhythm: Normal rate and regular rhythm.     Pulses: Normal pulses.     Heart sounds: Normal heart sounds.  Pulmonary:     Effort: Pulmonary effort is normal.     Breath sounds: Normal breath sounds.  Chest:     Comments: Port under the skin at the right upper chest, below the collarbone Abdominal:      Palpations: Abdomen is soft.  Musculoskeletal:     Right lower leg: No edema.     Left lower leg: No edema.     Comments: [employer] spinal cord nerve stimulator located mid lower back  Skin:    General: Skin is warm.     Capillary Refill: Capillary refill takes less than 2 seconds.  Neurological:     Mental Status: He is alert and oriented to person, place, and time.  Psychiatric:     Comments: Normal affect  Assessment & Plan:   Problem List Items Addressed This Visit       Cardiovascular and Mediastinum   HTN (hypertension) (Chronic)    -controlled -reported talking benicar Hct as needed rather than daily to prevent having very low BPs           Respiratory   Squamous cell carcinoma of left tonsil (HCC) - Primary    -doing well after treatment -c/o of fatigue, dry mouth, thick saliva, ageusia, soreness on the left tonsils, and restlessness.  -anticipate EOT PET scan in July - following up with Dr Isidore Moos after PET scan on 04/20/22. The appointment is subject to change depending on PET scan       Outpatient Encounter Medications as of 03/04/2022  Medication Sig   Multiple Vitamins-Minerals (CENTRUM ULTRA MENS) TABS Take 1 tablet by mouth daily.     olmesartan-hydrochlorothiazide (BENICAR HCT) 40-25 MG per tablet Take 1 tablet by mouth daily.     rosuvastatin (CRESTOR) 10 MG tablet Take 1 tablet (10 mg total) by mouth daily.   zolpidem (AMBIEN) 5 MG tablet Take 1 tablet (5 mg total) by mouth at bedtime as needed for sleep.   amoxicillin-clavulanate (AUGMENTIN) 875-125 MG tablet Take 1 tablet by mouth 2 (two) times daily. (Patient not taking: Reported on 03/04/2022)   chlorproMAZINE (THORAZINE) 10 MG tablet TAKE 1 TABLET(10 MG) BY MOUTH THREE TIMES DAILY AS NEEDED (Patient not taking: Reported on 03/04/2022)   dexamethasone (DECADRON) 4 MG tablet Take 2 tablets (8 mg total) by mouth daily. Take daily x 3 days starting the day after cisplatin chemotherapy. Take with  food. (Patient not taking: Reported on 03/04/2022)   lidocaine (XYLOCAINE) 2 % solution Patient: Mix 1part 2% viscous lidocaine, 1part H20. Swish & swallow 38m of diluted mixture, 365m before meals and at bedtime, up to QID (Patient not taking: Reported on 03/04/2022)   lidocaine-prilocaine (EMLA) cream Apply to affected area once (Patient not taking: Reported on 03/04/2022)   LORazepam (ATIVAN) 0.5 MG tablet Take 1 tablet (0.5 mg total) by mouth every 6 (six) hours as needed (Nausea or vomiting). (Patient not taking: Reported on 03/04/2022)   ondansetron (ZOFRAN) 8 MG tablet Take 1 tablet (8 mg total) by mouth 2 (two) times daily as needed. Start on the third day after cisplatin chemotherapy. (Patient not taking: Reported on 03/04/2022)   No facility-administered encounter medications on file as of 03/04/2022.    Follow-up: No follow-ups on file.   GlAlvira MondayFNP

## 2022-03-04 NOTE — Patient Instructions (Addendum)
I appreciate the opportunity to provide care to you today!    Follow up:  3 months  Labs: will get labs at next visit    Please continue to a heart-healthy diet and increase your physical activities. Try to exercise for 23mns at least three times a week.      It was a pleasure to see you and I look forward to continuing to work together on your health and well-being. Please do not hesitate to call the office if you need care or have questions about your care.   Have a wonderful day and week. With Gratitude, GAlvira MondayMSN, FNP-BC

## 2022-03-05 NOTE — Assessment & Plan Note (Signed)
-  controlled -reported talking benicar Hct as needed rather than daily to prevent having very low BPs

## 2022-03-05 NOTE — Assessment & Plan Note (Signed)
-  doing well after treatment -c/o of fatigue, dry mouth, thick saliva, ageusia, soreness on the left tonsils, and restlessness.  -anticipate EOT PET scan in July - following up with Dr Isidore Moos after PET scan on 04/20/22. The appointment is subject to change depending on PET scan

## 2022-03-08 ENCOUNTER — Encounter: Payer: Self-pay | Admitting: Hematology and Oncology

## 2022-03-08 NOTE — Progress Notes (Signed)
                                                                                                                                                             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 ([place]) Staging form: Pharynx - P16 Negative Oropharynx, AJCC 8th Edition - Clinical stage from 10/26/2021: Stage IVA (cT2, cN2c, cM0, p16-) - Signed by Eppie Gibson, MD on 10/26/2021 Stage prefix: Initial diagnosis  Squamous cell carcinoma of left tonsil (HCC) Staging form: Pharynx - P16 Negative Oropharynx, AJCC 8th Edition - Clinical stage from 10/26/2021: Stage IVA (cT2, cN2c, cM0, p16-) - Signed by Eppie Gibson, MD on 10/26/2021 Stage prefix: Initial diagnosis    Intent: Curative  Radiation Treatment Dates: 11/24/2021 through 01/11/2022 Site Technique Total Dose (Gy) Dose per Fx (Gy) Completed Fx Beam Energies  Tonsil, Left: HN_L_tonsil IMRT 70/70 2 35/35 6X   Narrative: The patient tolerated radiation therapy relatively well.   Plan: The patient will follow-up with radiation oncology in 2-3 wks .   -----------------------------------  Eppie Gibson, MD

## 2022-03-18 ENCOUNTER — Other Ambulatory Visit (HOSPITAL_COMMUNITY): Payer: No Typology Code available for payment source | Admitting: Dentistry

## 2022-03-25 ENCOUNTER — Other Ambulatory Visit (HOSPITAL_COMMUNITY): Payer: No Typology Code available for payment source | Admitting: Dentistry

## 2022-04-01 ENCOUNTER — Telehealth (HOSPITAL_COMMUNITY): Payer: Self-pay

## 2022-04-01 ENCOUNTER — Other Ambulatory Visit (HOSPITAL_COMMUNITY): Payer: No Typology Code available for payment source | Admitting: Dentistry

## 2022-04-04 ENCOUNTER — Telehealth: Payer: Self-pay | Admitting: *Deleted

## 2022-04-04 NOTE — Telephone Encounter (Signed)
Called patient to inform of Pet Scan for 04-14-22- arrival time- 11 am @ [facility], patient to have water only- 6 hrs. prior to test, patient to receive results from Dr. Isidore Moos on 04-20-22 @ 11:20 am, spoke with patient and he is aware of these appts.

## 2022-04-12 ENCOUNTER — Other Ambulatory Visit: Payer: Self-pay | Admitting: Family Medicine

## 2022-04-12 ENCOUNTER — Telehealth: Payer: Self-pay | Admitting: Family Medicine

## 2022-04-12 MED ORDER — TRAZODONE HCL 50 MG PO TABS: 50.0000 mg | ORAL_TABLET | Freq: Every evening | ORAL | 3 refills | Status: DC | PRN

## 2022-04-12 NOTE — Telephone Encounter (Signed)
Patient needs refill on   zolpidem (AMBIEN) 5 MG tablet    [facility] on [address]

## 2022-04-13 ENCOUNTER — Telehealth: Payer: Self-pay | Admitting: Family Medicine

## 2022-04-13 ENCOUNTER — Other Ambulatory Visit: Payer: Self-pay | Admitting: Family Medicine

## 2022-04-13 DIAGNOSIS — F5103 Paradoxical insomnia: Secondary | ICD-10-CM

## 2022-04-13 MED ORDER — ZOLPIDEM TARTRATE 5 MG PO TABS: 5.0000 mg | ORAL_TABLET | Freq: Every evening | ORAL | 0 refills | Status: AC | PRN

## 2022-04-13 NOTE — Telephone Encounter (Signed)
Pt states the new sleep med he was given his insur will not fill. He is wanting to know if he can please go back on [place] that he was originally was on??

## 2022-04-13 NOTE — Telephone Encounter (Signed)
Refill sent.

## 2022-04-13 NOTE — Telephone Encounter (Signed)
Pts wife informed.

## 2022-04-14 ENCOUNTER — Encounter (HOSPITAL_COMMUNITY)
Admission: RE | Admit: 2022-04-14 | Discharge: 2022-04-14 | Disposition: A | Discharge status: Home / Self Care | Payer: No Typology Code available for payment source | Source: Ambulatory Visit | Attending: Radiation Oncology | Admitting: Radiation Oncology

## 2022-04-14 DIAGNOSIS — C099 Malignant neoplasm of tonsil, unspecified: Secondary | ICD-10-CM | POA: Insufficient documentation

## 2022-04-14 LAB — GLUCOSE, CAPILLARY: Glucose-Capillary: 117 mg/dL — ABNORMAL HIGH (ref 70–99)

## 2022-04-14 MED ORDER — FLUDEOXYGLUCOSE F - 18 (FDG) INJECTION
7.5000 | Freq: Once | INTRAVENOUS | Status: AC | PRN
  Administered 2022-04-14: 7.5 via INTRAVENOUS

## 2022-04-14 NOTE — Telephone Encounter (Signed)
Pt informed

## 2022-04-20 ENCOUNTER — Other Ambulatory Visit: Payer: Self-pay

## 2022-04-20 ENCOUNTER — Ambulatory Visit
Admission: RE | Admit: 2022-04-20 | Discharge: 2022-04-20 | Disposition: A | Discharge status: Home / Self Care | Payer: No Typology Code available for payment source | Source: Ambulatory Visit | Attending: Radiation Oncology | Admitting: Radiation Oncology

## 2022-04-20 ENCOUNTER — Encounter: Payer: Self-pay | Admitting: Radiation Oncology

## 2022-04-20 VITALS — BP 123/85 | HR 72 | Temp 97.3°F | Resp 20 | Wt 151.8 lb

## 2022-04-20 DIAGNOSIS — R634 Abnormal weight loss: Secondary | ICD-10-CM | POA: Insufficient documentation

## 2022-04-20 DIAGNOSIS — C099 Malignant neoplasm of tonsil, unspecified: Secondary | ICD-10-CM

## 2022-04-20 DIAGNOSIS — C103 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: Secondary | ICD-10-CM | POA: Insufficient documentation

## 2022-04-20 DIAGNOSIS — Z7952 Long term (current) use of systemic steroids: Secondary | ICD-10-CM | POA: Insufficient documentation

## 2022-04-20 DIAGNOSIS — Z79899 Other long term (current) drug therapy: Secondary | ICD-10-CM | POA: Insufficient documentation

## 2022-04-20 NOTE — Progress Notes (Signed)
Radiation Oncology         [phone] ________________________________  Name: [name] [name] MRN: [id number]  Date: 04/20/2022  DOB: [birth year]  Follow-Up Visit Note  CC: [name], FNP  Leta Baptist, MD  Diagnosis and Prior Radiotherapy:       ICD-10-CM   1. 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 ([place])  C10.3     2. Squamous cell carcinoma of left tonsil ([facility])  C09.9       Cancer Staging  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 ([facility]) Staging form: Pharynx - P16 Negative Oropharynx, AJCC 8th Edition - Clinical stage from 10/26/2021: Stage IVA (cT2, cN2c, cM0, p16-) - Signed by Eppie Gibson, MD on 10/26/2021 Stage prefix: Initial diagnosis  Squamous cell carcinoma of left tonsil ([place]) Staging form: Pharynx - P16 Negative Oropharynx, AJCC 8th Edition - Clinical stage from 10/26/2021: Stage IVA (cT2, cN2c, cM0, p16-) - Signed by Eppie Gibson, MD on 10/26/2021 Stage prefix: Initial diagnosis   CHIEF COMPLAINT:  Here for follow-up and surveillance of throat cancer  Narrative:  The patient returns today for routine follow-up.  [name] [name] presents today for follow-up after completing radiation to his left tonsil on 01/11/2022  Pain issues, if any: Reports continued left throat and left inner ear pain (and itching) Using a feeding tube?: N/A Weight changes, if any:  Wt Readings from Last 3 Encounters:  04/20/22 151 lb 12.8 oz (68.9 kg)  03/04/22 154 lb (69.9 kg)  03/02/22 156 lb 1.6 oz (70.8 kg)   Swallowing issues, if any: Yes--reports pain with swallowing solids and liquids. Reports current diet is predominately Boost/Ensure with small meals occasionally Smoking or chewing tobacco? None Using fluoride trays daily? Denies any dental concerns. Has F/U with Dr. Sandi Mariscal this Friday 04/22/22 Last ENT visit was on: Not since diagnosis Other notable issues, if any: Saw medical oncologist (Dr. Chryl Heck) on 02/22/2022. Continues to deal with dry mouth and  thick saliva. Denies any swelling concerns under his chin or down his neck  Personally reviewed his images with radiology today at tumor board.  The patient emphasizes that he is distressed by his persistent throat pain and would like a tonsillectomy to address this.  He also has persistent fatigue that has lingered with the pain since completing radiation therapy.  ALLERGIES:  is allergic to codeine.  Meds: Current Outpatient Medications  Medication Sig Dispense Refill   amoxicillin-clavulanate (AUGMENTIN) 875-125 MG tablet Take 1 tablet by mouth 2 (two) times daily. (Patient not taking: Reported on 03/04/2022) 14 tablet 0   chlorproMAZINE (THORAZINE) 10 MG tablet TAKE 1 TABLET(10 MG) BY MOUTH THREE TIMES DAILY AS NEEDED (Patient not taking: Reported on 03/04/2022) 10 tablet 0   dexamethasone (DECADRON) 4 MG tablet Take 2 tablets (8 mg total) by mouth daily. Take daily x 3 days starting the day after cisplatin chemotherapy. Take with food. (Patient not taking: Reported on 03/04/2022) 30 tablet 1   lidocaine (XYLOCAINE) 2 % solution Patient: Mix 1part 2% viscous lidocaine, 1part H20. Swish & swallow 34m of diluted mixture, 344m before meals and at bedtime, up to QID (Patient not taking: Reported on 03/04/2022) 200 mL 3   lidocaine-prilocaine (EMLA) cream Apply to affected area once (Patient not taking: Reported on 03/04/2022) 30 g 3   LORazepam (ATIVAN) 0.5 MG tablet Take 1 tablet (0.5 mg total) by mouth every 6 (six) hours as needed (Nausea or vomiting). (Patient not taking: Reported on 03/04/2022) 30 tablet 0   Multiple Vitamins-Minerals (  CENTRUM ULTRA MENS) TABS Take 1 tablet by mouth daily.       olmesartan-hydrochlorothiazide (BENICAR HCT) 40-25 MG per tablet Take 1 tablet by mouth daily.       ondansetron (ZOFRAN) 8 MG tablet Take 1 tablet (8 mg total) by mouth 2 (two) times daily as needed. Start on the third day after cisplatin chemotherapy. (Patient not taking: Reported on 03/04/2022) 30 tablet 1    rosuvastatin (CRESTOR) 10 MG tablet Take 1 tablet (10 mg total) by mouth daily. 90 tablet 0   traZODone (DESYREL) 50 MG tablet Take 1 tablet (50 mg total) by mouth at bedtime as needed for sleep. 30 tablet 3   zolpidem (AMBIEN) 5 MG tablet Take 1 tablet (5 mg total) by mouth at bedtime as needed for sleep. 30 tablet 0   No current facility-administered medications for this encounter.    Physical Findings: The patient is in no acute distress. Patient is alert and oriented. Wt Readings from Last 3 Encounters:  04/20/22 151 lb 12.8 oz (68.9 kg)  03/04/22 154 lb (69.9 kg)  03/02/22 156 lb 1.6 oz (70.8 kg)    weight is 151 lb 12.8 oz (68.9 kg). His temperature is 97.3 F (36.3 C) (abnormal). His blood pressure is 123/85 and his pulse is 72. His respiration is 20 and oxygen saturation is 99%. .  General: Alert and oriented, in no acute distress HEENT: Head is normocephalic. Extraocular movements are intact. Oropharynx is notable for no visible tumor or thrush or mucositis.  No visible tumor.  Tongue is midline. Neck:   No palpable masses.  No lymphedema. Skin: Skin in treatment fields shows satisfactory healing  Lymphatics: see Neck Exam Heart regular rate and rhythm Chest clear to auscultation bilaterally Psychiatric: Judgment and insight are intact. Affect is appropriate. Abdomen is soft and nondistended  Lab Findings: Lab Results  Component Value Date   WBC 4.3 03/02/2022   HGB 11.4 (L) 03/02/2022   HCT 32.7 (L) 03/02/2022   MCV 104.8 (H) 03/02/2022   PLT 153 03/02/2022    Lab Results  Component Value Date   TSH 1.099 11/24/2021    Radiographic Findings: NM PET Image Restag (PS) Skull Base To Thigh  Result Date: 04/15/2022 CLINICAL DATA:  Subsequent treatment strategy for squamous cell carcinoma the tonsil. EXAM: NUCLEAR MEDICINE PET SKULL BASE TO THIGH TECHNIQUE: 7.9 mCi F-18 FDG was injected intravenously. Full-ring PET imaging was performed from the skull base to thigh  after the radiotracer. CT data was obtained and used for attenuation correction and anatomic localization. Fasting blood glucose: 118 mg/dl COMPARISON:  PET-CT 10/18/2021 FINDINGS: Mediastinal blood pool activity: SUV max 2.0 Liver activity: SUV max NA NECK: Residual hypermetabolic activity in the LEFT [place] tonsil region with SUV max equal 6.1 decreased SUV max equal 12.7. Persistent hypermetabolic LEFT level 2 lymph node SUV max equal 6.0 compared SUV max equal 6.3. Node is reduced in size measuring 6 mm decreased from 9 mm (image 31). No new hypermetabolic lymph nodes in the neck. Incidental CT findings: none CHEST: Unfortunately, there several new hypermetabolic pulmonary nodules. For example LEFT upper lobe pulmonary nodule measuring 8 mm (image 72/4) SUV max equal 5.8. Left lower lobe nodule centrally at the hila measuring 7 mm (image 78/4) with SUV max equal 4.3 LEFT lower lobe nodule measuring 9 mm the medial LEFT lower lobe on image 91 with SUV max equal 6.7. In the RIGHT upper lobe medial 8 mm nodule on image 85 with SUV max equal  4.0 Metabolic activity associated with nodule in the RIGHT chest wall with SUV max 3.6 on image 89 which is favored benign breast tissue. No hypermetabolic mediastinal lymph nodes. Incidental CT findings: Port in the anterior chest wall with tip in distal SVC. ABDOMEN/PELVIS: No abnormal hypermetabolic activity within the liver, pancreas, adrenal glands, or spleen. No hypermetabolic lymph nodes in the abdomen or pelvis. Incidental CT findings: Chronic calcifications the pancreas. SKELETON: No focal hypermetabolic activity to suggest skeletal metastasis. Incidental CT findings: none IMPRESSION: 1. Unfortunately, new bilateral small hypermetabolic pulmonary nodular metastasis. 2. Residual hypermetabolic activity in the LEFT palatine tonsil region. 3. Residual metabolic activity associated with a LEFT level II cervical lymph node. 4. No new adenopathy in the neck. Electronically  Signed   By: Suzy Bouchard M.D.   On: 04/15/2022 17:01    Impression/Plan:    1) Head and Neck Cancer Status: I showed the patient his PET images from before and after treatment.  I let him know that the consensus from tumor board this morning is that he has potential persistent disease in the left tonsillar region and left neck, but it is also possible that these are inflammatory or reactive findings.  In his chest he has multiple nodules that are highly suspicious for metastatic disease.  The consensus at tumor board is to refer him back to medical oncology for consideration of systemic therapy.  The patient is highly interested in also seeing otolaryngology to consider tonsillectomy for his tonsil pain.  I explained that I do not think that this would help his pain or his prognosis but he is quite adamant that he would like to see a specialist at [facility] for their opinion, so our navigator is going to go ahead and make a referral.  2) Nutritional Status: Continue following the instructions of nutritionist.  Mild weight loss noted over the past couple of months.  He is not using a feeding tube Wt Readings from Last 3 Encounters:  04/20/22 151 lb 12.8 oz (68.9 kg)  03/04/22 154 lb (69.9 kg)  03/02/22 156 lb 1.6 oz (70.8 kg)     3) Risk Factors: The patient has been educated about risk factors including alcohol and tobacco abuse; they understand that avoidance of alcohol and tobacco is important to prevent recurrences as well as other cancers.   He has not been  smoking recently and I urged him never to resume.   He does report that he occasionally drinking alcohol.  4) Swallowing: Functional, continue following advice of swallowing therapist  5) Dental: Encouraged to continue regular followup with dentistry, and dental hygiene including fluoride rinses.   6) Thyroid function: Check annually with medical oncology Lab Results  Component Value Date   TSH 1.099 11/24/2021    7) I will see him  back on an as-needed basis.  I wished him the very best and encouraged him to call me if there is anything else I can do to help him; our navigator was present for this follow-up and will work on the referrals per #1.  Additionally we discussed referrals to social work for emotional distress and grief (recent deaths of parents ) and he is receptive to this.  I also recommended referral to palliative medicine given his pain and concerns about quality of life, symptom management.  On date of service, in total, I spent 30 minutes on this encounter. Patient was seen in person. _____________________________________   Eppie Gibson, MD

## 2022-04-20 NOTE — Progress Notes (Signed)
Mr. Dorer presents today for follow-up after completing radiation to his left tonsil on 01/11/2022, and to review PET scan from 04/15/2022  Pain issues, if any: Reports continued left throat and left inner ear pain (and itching) Using a feeding tube?: N/A Weight changes, if any:  Wt Readings from Last 3 Encounters:  04/20/22 151 lb 12.8 oz (68.9 kg)  03/04/22 154 lb (69.9 kg)  03/02/22 156 lb 1.6 oz (70.8 kg)   Swallowing issues, if any: Yes--reports pain with swallowing solids and liquids. Reports current diet is predominately Boost/Ensure with small meals occasionally Smoking or chewing tobacco? None Using fluoride trays daily? Denies any dental concerns. Has F/U with Dr. Sandi Mariscal this Friday 04/22/22 Last ENT visit was on: Not since diagnosis Other notable issues, if any: Saw medical oncologist (Dr. Chryl Heck) on 02/22/2022. Continues to deal with dry mouth and thick saliva. Denies any swelling concerns under his chin or down his neck

## 2022-04-20 NOTE — Addendum Note (Signed)
Encounter addended by: Eppie Gibson, MD on: 04/20/2022 5:47 PM  Actions taken: Visit diagnoses modified, Medication List reviewed, Problem List reviewed, Allergies reviewed, Clinical Note Signed, Level of Service modified

## 2022-04-21 ENCOUNTER — Other Ambulatory Visit: Payer: Self-pay

## 2022-04-21 ENCOUNTER — Telehealth: Payer: Self-pay | Admitting: Nurse Practitioner

## 2022-04-21 ENCOUNTER — Telehealth: Payer: Self-pay | Admitting: Hematology and Oncology

## 2022-04-21 DIAGNOSIS — C103 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: Secondary | ICD-10-CM

## 2022-04-21 DIAGNOSIS — C099 Malignant neoplasm of tonsil, unspecified: Secondary | ICD-10-CM

## 2022-04-21 NOTE — Telephone Encounter (Signed)
.  Called patient to schedule appointment per 7/19 inbasket, patient is aware of date and time.   

## 2022-04-21 NOTE — Telephone Encounter (Signed)
Scheduled per 7/20 secure chat, pt has been called and confirmed appt

## 2022-04-22 ENCOUNTER — Emergency Department (HOSPITAL_COMMUNITY): Payer: No Typology Code available for payment source

## 2022-04-22 ENCOUNTER — Encounter: Payer: Self-pay | Admitting: Licensed Clinical Social Worker

## 2022-04-22 ENCOUNTER — Emergency Department (HOSPITAL_COMMUNITY)
Admission: EM | Admit: 2022-04-22 | Discharge: 2022-04-23 | Disposition: A | Discharge status: Home / Self Care | Payer: No Typology Code available for payment source | Attending: Emergency Medicine | Admitting: Emergency Medicine

## 2022-04-22 ENCOUNTER — Encounter (HOSPITAL_COMMUNITY): Payer: Self-pay | Admitting: Emergency Medicine

## 2022-04-22 ENCOUNTER — Other Ambulatory Visit: Payer: Self-pay

## 2022-04-22 DIAGNOSIS — Z96641 Presence of right artificial hip joint: Secondary | ICD-10-CM | POA: Insufficient documentation

## 2022-04-22 DIAGNOSIS — I129 Hypertensive chronic kidney disease with stage 1 through stage 4 chronic kidney disease, or unspecified chronic kidney disease: Secondary | ICD-10-CM | POA: Insufficient documentation

## 2022-04-22 DIAGNOSIS — Z87891 Personal history of nicotine dependence: Secondary | ICD-10-CM | POA: Insufficient documentation

## 2022-04-22 DIAGNOSIS — W172XXA Fall into hole, initial encounter: Secondary | ICD-10-CM | POA: Insufficient documentation

## 2022-04-22 DIAGNOSIS — N182 Chronic kidney disease, stage 2 (mild): Secondary | ICD-10-CM | POA: Insufficient documentation

## 2022-04-22 DIAGNOSIS — S4992XA Unspecified injury of left shoulder and upper arm, initial encounter: Secondary | ICD-10-CM | POA: Diagnosis present

## 2022-04-22 DIAGNOSIS — Y92007 Garden or yard of unspecified non-institutional (private) residence as the place of occurrence of the external cause: Secondary | ICD-10-CM | POA: Diagnosis not present

## 2022-04-22 DIAGNOSIS — S42292A Other displaced fracture of upper end of left humerus, initial encounter for closed fracture: Secondary | ICD-10-CM | POA: Insufficient documentation

## 2022-04-22 NOTE — ED Triage Notes (Signed)
Pt fell in hole in yard landing on the left shoulder.

## 2022-04-22 NOTE — Progress Notes (Signed)
Oncology Nurse Navigator Documentation   I met with Mr. [name] [name] during his follow up with Dr. Isidore Moos yesterday. I have referred him to [facility] at Dr. Pearlie Oyster request. He has also been referred to SW, back to Dr. Chryl Heck, and palliative care. He is aware of all these referrals and knows to call me if I can assist him further.   Harlow Asa RN, BSN, OCN Head & Neck Oncology Nurse [place] at [facility] Phone # [phone]  Fax # [phone]

## 2022-04-23 NOTE — Discharge Instructions (Signed)
You were evaluated in the Emergency Department and after careful evaluation, we did not find any emergent condition requiring admission or further testing in the hospital.  Your exam/testing today was overall reassuring.  Your x-ray showed a broken humerus bone.  You will need follow-up with the orthopedic specialists for future management.  Call the number provided.  Wear the sling until you follow-up with them.  Please return to the Emergency Department if you experience any worsening of your condition.  Thank you for allowing [place] to be a part of your care.

## 2022-04-23 NOTE — ED Provider Notes (Signed)
[facility] Emergency Department Provider Note MRN:  [id number]  Arrival date & time: 04/23/22     Chief Complaint   Shoulder Injury   History of Present Illness   [name] [name] is a 66 y.o. year-old male with a history of tonsillar cancer presenting to the ED with chief complaint of shoulder injury.  Patient was talking to a neighbor in the yard and accidentally stepped in a hole and fell onto his left shoulder.  Endorsing pain to the shoulder, much worse with any attempted movement.  No head trauma or loss of consciousness, no other injuries.  Review of Systems  A thorough review of systems was obtained and all systems are negative except as noted in the HPI and PMH.   Patient's Health History    Past Medical History:  Diagnosis Date   Acute kidney failure ([place]) 04/2018   CKD (chronic kidney disease), stage II    Complication of anesthesia    DDD (degenerative disc disease), lumbar    History of pancreatitis    20-30 years ago   History of venomous snake bite    rt index finger-   Hypercholesterolemia    Hypertension    Nerve pain    Right leg, resolved with stimulator   PONV (postoperative nausea and vomiting)    S/P insertion of spinal cord stimulator 08/2017   Spinal stenosis    Tobacco abuse     Past Surgical History:  Procedure Laterality Date   COLONOSCOPY     FINGER EXPLORATION  05/2011   rt index finger [id number]   HERNIA REPAIR     as a child   I & D EXTREMITY  11/24/2011   Procedure: IRRIGATION AND DEBRIDEMENT EXTREMITY;  Surgeon: Tennis Must, MD;  Location: [place];  Service: Orthopedics;  Laterality: Right;  right index   IR IMAGING GUIDED PORT INSERTION  11/17/2021   IR US GUIDE VASC ACCESS RIGHT  11/17/2021   MASS EXCISION  01/23/2012   Procedure: EXCISION MASS;  Surgeon: Tennis Must, MD;  Location: [place];  Service: Orthopedics;  Laterality: Right;  Right index a-cell graft   TOTAL  HIP ARTHROPLASTY Right 05/03/2018   Procedure: RIGHT TOTAL HIP ARTHROPLASTY ANTERIOR APPROACH;  Surgeon: Rod Can, MD;  Location: [facility];  Service: Orthopedics;  Laterality: Right;  Needs RNFA    Family History  Problem Relation Age of Onset   Diabetes Mother    Throat cancer Brother     Social History   Socioeconomic History   Marital status: Married    Spouse name: Not on file   Number of children: Not on file   Years of education: Not on file   Highest education level: Not on file  Occupational History   Not on file  Tobacco Use   Smoking status: Former    Packs/day: 0.50    Years: 20.00    Total pack years: 10.00    Types: Cigarettes   Smokeless tobacco: Never  Vaping Use   Vaping Use: Never used  Substance and Sexual Activity   Alcohol use: Yes    Comment: occasional   Drug use: No   Sexual activity: Not Currently  Other Topics Concern   Not on file  Social History Narrative   Not on file   Social Determinants of Health   Financial Resource Strain: Low Risk  (11/22/2021)   Overall Financial Resource Strain (CARDIA)    Difficulty of Paying Living  Expenses: Not hard at all  Food Insecurity: No Food Insecurity (11/22/2021)   Hunger Vital Sign    Worried About Running Out of Food in the Last Year: Never true    Ran Out of Food in the Last Year: Never true  Transportation Needs: No Transportation Needs (11/22/2021)   PRAPARE - Hydrologist (Medical): No    Lack of Transportation (Non-Medical): No  Physical Activity: Not on file  Stress: Not on file  Social Connections: Not on file  Intimate Partner Violence: Not on file     Physical Exam   Vitals:   04/22/22 2346  BP: 123/85  Pulse: 86  Resp: 17  Temp: 99.1 F (37.3 C)  SpO2: 94%    CONSTITUTIONAL: Well-appearing, NAD NEURO/PSYCH:  Alert and oriented x 3, no focal deficits EYES:  eyes equal and reactive ENT/NECK:  no LAD, no JVD CARDIO: Regular rate, well-perfused,  normal S1 and S2 PULM:  CTAB no wheezing or rhonchi GI/GU:  non-distended, non-tender MSK/SPINE:  No gross deformities, no edema SKIN:  no rash, atraumatic   *Additional and/or pertinent findings included in MDM below  Diagnostic and Interventional Summary    EKG Interpretation  Date/Time:    Ventricular Rate:    PR Interval:    QRS Duration:   QT Interval:    QTC Calculation:   R Axis:     Text Interpretation:         Labs Reviewed - No data to display  DG Shoulder Left  Final Result      Medications - No data to display   Procedures  /  Critical Care Procedures  ED Course and Medical Decision Making  Initial Impression and Ddx Isolated shoulder injury, question fracture versus dislocation, awaiting x-ray.  Past medical/surgical history that increases complexity of ED encounter: History of tonsillar cancer  Interpretation of Diagnostics I personally reviewed the shoulder x-ray and my interpretation is as follows: Comminuted humerus fracture proximally    Patient Reassessment and Ultimate Disposition/Management     The limb is neurovascularly intact, vital signs normal, patient is appropriate for discharge with follow-up.  Patient management required discussion with the following services or consulting groups:  None  Complexity of Problems Addressed Acute illness or injury that poses threat of life of bodily function  Additional Data Reviewed and Analyzed Further history obtained from: Prior labs/imaging results  Additional Factors Impacting ED Encounter Risk None  Barth Kirks. Sedonia Small, MD [place] [email or web address]  Final Clinical Impressions(s) / ED Diagnoses     ICD-10-CM   1. Other closed displaced fracture of proximal end of left humerus, initial encounter  S42.292A       ED Discharge Orders     None        Discharge Instructions Discussed with and Provided to Patient:    Discharge  Instructions      You were evaluated in the Emergency Department and after careful evaluation, we did not find any emergent condition requiring admission or further testing in the hospital.  Your exam/testing today was overall reassuring.  Your x-ray showed a broken humerus bone.  You will need follow-up with the orthopedic specialists for future management.  Call the number provided.  Wear the sling until you follow-up with them.  Please return to the Emergency Department if you experience any worsening of your condition.  Thank you for allowing [place] to be a part of your care.  Maudie Flakes, MD 04/23/22 [phone]

## 2022-04-25 ENCOUNTER — Other Ambulatory Visit: Payer: Self-pay

## 2022-04-25 ENCOUNTER — Telehealth: Payer: Self-pay | Admitting: Orthopedic Surgery

## 2022-04-25 ENCOUNTER — Encounter: Payer: Self-pay | Admitting: Orthopedic Surgery

## 2022-04-25 ENCOUNTER — Inpatient Hospital Stay: Payer: No Typology Code available for payment source | Admitting: Hematology and Oncology

## 2022-04-25 ENCOUNTER — Ambulatory Visit (INDEPENDENT_AMBULATORY_CARE_PROVIDER_SITE_OTHER): Payer: No Typology Code available for payment source | Admitting: Orthopedic Surgery

## 2022-04-25 VITALS — BP 137/94 | HR 93 | Ht 68.0 in | Wt 152.6 lb

## 2022-04-25 DIAGNOSIS — S42292A Other displaced fracture of upper end of left humerus, initial encounter for closed fracture: Secondary | ICD-10-CM | POA: Diagnosis not present

## 2022-04-25 NOTE — Telephone Encounter (Signed)
Spoke with patient/scheduled accordingly; patient aware of appointment today, 11:40am

## 2022-04-25 NOTE — Telephone Encounter (Signed)
Patient called initially at 8:35am and left voice message to return call, regarding his Emergency room visit on Friday 04/22/22 for "x-ray showed a broken humerus bone" I called him at 8:58am at the land line phone number he left on message [phone], and there was no answer or voice mail. I called him on cell # and reached. Patient said he was on line with another orthopaedist. We discussed appointment availability per Dr Ruthe Mannan updated message today - patient states he has got to be seen today. Please review and advise*patient requests call back on ph [phone]

## 2022-04-25 NOTE — Progress Notes (Signed)
Chief Complaint  Patient presents with   Shoulder Injury    Left, fell 04/22/22 on grass   66 year old male with history of throat cancer scheduled to see doctors at [facility] to determine if the cancer has been cured presents after fall on 21st injuring his left shoulder with proximal humerus fracture  Complains of pain  After reviewing his x-rays and his record including the ER record and cancer records I recommended nonoperative treatment  I explained to him that patients with medical problems including cancer treatments and ongoing did worse with surgery then patients without surgery  He has bruising and ecchymosis of the left shoulder with tenderness and swelling he is neurovascularly intact  His x-rays show a proximal humerus fracture with head displacement and angulation  He is going to go to his appointment at [facility] he will follow-up in 2 weeks we can revisit surgical intervention if necessary we will refer him for what will likely be a proximal humerus replacement  Encounter Diagnosis  Name Primary?   Other closed displaced fracture of proximal end of left humerus, initial encounter Yes

## 2022-04-26 ENCOUNTER — Other Ambulatory Visit: Payer: Self-pay

## 2022-04-26 ENCOUNTER — Inpatient Hospital Stay
Payer: No Typology Code available for payment source | Attending: Hematology and Oncology | Admitting: Nurse Practitioner

## 2022-04-26 ENCOUNTER — Encounter: Payer: Self-pay | Admitting: Nurse Practitioner

## 2022-04-26 DIAGNOSIS — F17210 Nicotine dependence, cigarettes, uncomplicated: Secondary | ICD-10-CM | POA: Insufficient documentation

## 2022-04-26 DIAGNOSIS — E7800 Pure hypercholesterolemia, unspecified: Secondary | ICD-10-CM | POA: Insufficient documentation

## 2022-04-26 DIAGNOSIS — Z7189 Other specified counseling: Secondary | ICD-10-CM

## 2022-04-26 DIAGNOSIS — Z515 Encounter for palliative care: Secondary | ICD-10-CM | POA: Diagnosis not present

## 2022-04-26 DIAGNOSIS — R530 Neoplastic (malignant) related fatigue: Secondary | ICD-10-CM | POA: Diagnosis not present

## 2022-04-26 DIAGNOSIS — M5136 Other intervertebral disc degeneration, lumbar region: Secondary | ICD-10-CM | POA: Insufficient documentation

## 2022-04-26 DIAGNOSIS — C099 Malignant neoplasm of tonsil, unspecified: Secondary | ICD-10-CM | POA: Diagnosis not present

## 2022-04-26 DIAGNOSIS — Z85818 Personal history of malignant neoplasm of other sites of lip, oral cavity, and pharynx: Secondary | ICD-10-CM | POA: Insufficient documentation

## 2022-04-26 DIAGNOSIS — C103 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: Secondary | ICD-10-CM | POA: Diagnosis not present

## 2022-04-26 DIAGNOSIS — I129 Hypertensive chronic kidney disease with stage 1 through stage 4 chronic kidney disease, or unspecified chronic kidney disease: Secondary | ICD-10-CM | POA: Insufficient documentation

## 2022-04-26 DIAGNOSIS — K5900 Constipation, unspecified: Secondary | ICD-10-CM | POA: Diagnosis not present

## 2022-04-26 DIAGNOSIS — Z79899 Other long term (current) drug therapy: Secondary | ICD-10-CM | POA: Insufficient documentation

## 2022-04-26 DIAGNOSIS — G893 Neoplasm related pain (acute) (chronic): Secondary | ICD-10-CM | POA: Insufficient documentation

## 2022-04-26 DIAGNOSIS — N182 Chronic kidney disease, stage 2 (mild): Secondary | ICD-10-CM | POA: Insufficient documentation

## 2022-04-26 MED ORDER — LIDOCAINE VISCOUS HCL 2 % MT SOLN: OROMUCOSAL | 3 refills | Status: DC

## 2022-04-26 MED ORDER — XTAMPZA ER 9 MG PO C12A: 9.0000 mg | EXTENDED_RELEASE_CAPSULE | Freq: Two times a day (BID) | ORAL | 0 refills | Status: DC

## 2022-04-26 NOTE — Progress Notes (Signed)
[facility]  Telephone:(336) [phone] Fax:(336) [phone]   Name: [name] [name] Date: 04/26/2022 MRN: [id number]  DOB: 1956-04-01  Patient Care Team: [name], [place] as PCP - General (Family Medicine) Malmfelt, Stephani Police, RN as Oncology Nurse Navigator Eppie Gibson, MD as Consulting Physician (Radiation Oncology) Benay Pike, MD as Consulting Physician (Hematology and Oncology) Lennox Grumbles, MD as Referring Physician (Otolaryngology) Pickenpack-Cousar, Carlena Sax, NP as Nurse Practitioner (Nurse Practitioner)   I connected with [name] [name] on 04/26/22 at  3:30 PM EDT by phone and verified that I am speaking with the correct person using two identifiers.   I discussed the limitations, risks, security and privacy concerns of performing an evaluation and management service by telemedicine and the availability of in-person appointments. I also discussed with the patient that there may be a patient responsible charge related to this service. The patient expressed understanding and agreed to proceed.   Other persons participating in the visit and their role in the encounter: Maygan, RN  Patient's location: Home   Provider's location: [place]    Chief Complaint: Symptom Management    REASON FOR CONSULTATION: BERTRAND VOWELS is a 66 y.o. male with medical history including stage IV posterior wall oropharynx, squamous cell left tonsil s/p chemotherapy (cisplatin) and radiation (completed 01/2022) .  Palliative ask to see for symptom management.    SOCIAL HISTORY:     reports that he has quit smoking. His smoking use included cigarettes. He has a 10.00 pack-year smoking history. He has never used smokeless tobacco. He reports current alcohol use. He reports that he does not use drugs.  ADVANCE DIRECTIVES:  [name] [name] has a documented advanced directive. His wife [name] [name] is documented Scientist, research (medical). No feeding tube.    CODE STATUS: DNR  PAST MEDICAL HISTORY: Past Medical History:  Diagnosis Date   Acute kidney failure ([name]) 04/2018   CKD (chronic kidney disease), stage II    Complication of anesthesia    DDD (degenerative disc disease), lumbar    History of pancreatitis    20-30 years ago   History of venomous snake bite    rt index finger-   Hypercholesterolemia    Hypertension    Nerve pain    Right leg, resolved with stimulator   PONV (postoperative nausea and vomiting)    S/P insertion of spinal cord stimulator 08/2017   Spinal stenosis    Tobacco abuse     PAST SURGICAL HISTORY:  Past Surgical History:  Procedure Laterality Date   COLONOSCOPY     FINGER EXPLORATION  05/2011   rt index finger [id number]   HERNIA REPAIR     as a child   I & D EXTREMITY  11/24/2011   Procedure: IRRIGATION AND DEBRIDEMENT EXTREMITY;  Surgeon: Tennis Must, MD;  Location: [facility];  Service: Orthopedics;  Laterality: Right;  right index   IR IMAGING GUIDED PORT INSERTION  11/17/2021   IR US GUIDE VASC ACCESS RIGHT  11/17/2021   MASS EXCISION  01/23/2012   Procedure: EXCISION MASS;  Surgeon: Tennis Must, MD;  Location: [facility];  Service: Orthopedics;  Laterality: Right;  Right index a-cell graft   TOTAL HIP ARTHROPLASTY Right 05/03/2018   Procedure: RIGHT TOTAL HIP ARTHROPLASTY ANTERIOR APPROACH;  Surgeon: Rod Can, MD;  Location: [facility];  Service: Orthopedics;  Laterality: Right;  Needs RNFA    HEMATOLOGY/ONCOLOGY HISTORY:  Oncology History  Squamous  cell carcinoma of left tonsil ([facility])  10/25/2021 Initial Diagnosis   Squamous cell carcinoma of left tonsil ([place])   10/26/2021 Cancer Staging   Staging form: Pharynx - P16 Negative Oropharynx, AJCC 8th Edition - Clinical stage from 10/26/2021: Stage IVA (cT2, cN2c, cM0, p16-) - Signed by Eppie Gibson, MD on 10/26/2021 Stage prefix: Initial diagnosis   11/25/2021 -  Chemotherapy   Patient is on Treatment Plan :  HEAD/NECK Cisplatin q7d     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 ([place])  10/26/2021 Initial Diagnosis   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 ([place])   10/26/2021 Cancer Staging   Staging form: Pharynx - P16 Negative Oropharynx, AJCC 8th Edition - Clinical stage from 10/26/2021: Stage IVA (cT2, cN2c, cM0, p16-) - Signed by Eppie Gibson, MD on 10/26/2021 Stage prefix: Initial diagnosis     ALLERGIES:  is allergic to codeine.  MEDICATIONS:  Current Outpatient Medications  Medication Sig Dispense Refill   oxyCODONE ER (XTAMPZA ER) 9 MG C12A Take 9 mg by mouth every 12 (twelve) hours. 60 capsule 0   amoxicillin-clavulanate (AUGMENTIN) 875-125 MG tablet Take 1 tablet by mouth 2 (two) times daily. 14 tablet 0   chlorproMAZINE (THORAZINE) 10 MG tablet TAKE 1 TABLET(10 MG) BY MOUTH THREE TIMES DAILY AS NEEDED 10 tablet 0   dexamethasone (DECADRON) 4 MG tablet Take 2 tablets (8 mg total) by mouth daily. Take daily x 3 days starting the day after cisplatin chemotherapy. Take with food. 30 tablet 1   lidocaine (XYLOCAINE) 2 % solution Patient: Mix 1part 2% viscous lidocaine, 1part H20. Swish & swallow 37m of diluted mixture, 340m before meals and at bedtime, up to QID 200 mL 3   lidocaine-prilocaine (EMLA) cream Apply to affected area once 30 g 3   LORazepam (ATIVAN) 0.5 MG tablet Take 1 tablet (0.5 mg total) by mouth every 6 (six) hours as needed (Nausea or vomiting). 30 tablet 0   Multiple Vitamins-Minerals (CENTRUM ULTRA MENS) TABS Take 1 tablet by mouth daily.       olmesartan-hydrochlorothiazide (BENICAR HCT) 40-25 MG per tablet Take 1 tablet by mouth daily.       ondansetron (ZOFRAN) 8 MG tablet Take 1 tablet (8 mg total) by mouth 2 (two) times daily as needed. Start on the third day after cisplatin chemotherapy. 30 tablet 1   rosuvastatin (CRESTOR) 10 MG tablet Take 1 tablet (10 mg total) by mouth daily. 90 tablet 0   traZODone (DESYREL) 50 MG tablet Take 1 tablet (50  mg total) by mouth at bedtime as needed for sleep. (Patient not taking: Reported on 04/26/2022) 30 tablet 3   zolpidem (AMBIEN) 5 MG tablet Take 1 tablet (5 mg total) by mouth at bedtime as needed for sleep. 30 tablet 0   No current facility-administered medications for this visit.    VITAL SIGNS: There were no vitals taken for this visit. There were no vitals filed for this visit.  Estimated body mass index is 23.2 kg/m as calculated from the following:   Height as of 04/25/22: '5\' 8"'$  (1.727 m).   Weight as of 04/25/22: 152 lb 9.6 oz (69.2 kg).  LABS: CBC:    Component Value Date/Time   WBC 4.3 03/02/2022 1420   WBC 7.1 12/01/2021 0910   HGB 11.4 (L) 03/02/2022 1420   HCT 32.7 (L) 03/02/2022 1420   PLT 153 03/02/2022 1420   MCV 104.8 (H) 03/02/2022 1420   NEUTROABS 2.4 03/02/2022 1420   LYMPHSABS 0.8 03/02/2022 1420  MONOABS 1.0 03/02/2022 1420   EOSABS 0.2 03/02/2022 1420   BASOSABS 0.0 03/02/2022 1420   Comprehensive Metabolic Panel:    Component Value Date/Time   NA 133 (L) 03/02/2022 1420   K 3.8 03/02/2022 1420   CL 105 03/02/2022 1420   CO2 23 03/02/2022 1420   BUN 18 03/02/2022 1420   CREATININE 0.40 (L) 03/02/2022 1420   CREATININE 0.69 01/11/2022 1258   GLUCOSE 109 (H) 03/02/2022 1420   CALCIUM 9.1 03/02/2022 1420   AST 34 03/02/2022 1420   AST 72 (H) 10/25/2021 1053   ALT 31 03/02/2022 1420   ALT 48 (H) 10/25/2021 1053   ALKPHOS 60 03/02/2022 1420   BILITOT 0.6 03/02/2022 1420   BILITOT 1.0 10/25/2021 1053   PROT 6.5 03/02/2022 1420   ALBUMIN 3.9 03/02/2022 1420    RADIOGRAPHIC STUDIES: DG Shoulder Left  Result Date: 04/23/2022 CLINICAL DATA:  Fall EXAM: LEFT SHOULDER - 2+ VIEW COMPARISON:  None Available. FINDINGS: Comminuted fracture at the neck of the left humerus with mild displacement and over riding. No glenohumeral dislocation. IMPRESSION: Comminuted fracture of the neck of the left humerus. Electronically Signed   By: Ulyses Jarred M.D.   On:  04/23/2022 00:07   NM PET Image Restag (PS) Skull Base To Thigh  Result Date: 04/15/2022 CLINICAL DATA:  Subsequent treatment strategy for squamous cell carcinoma the tonsil. EXAM: NUCLEAR MEDICINE PET SKULL BASE TO THIGH TECHNIQUE: 7.9 mCi F-18 FDG was injected intravenously. Full-ring PET imaging was performed from the skull base to thigh after the radiotracer. CT data was obtained and used for attenuation correction and anatomic localization. Fasting blood glucose: 118 mg/dl COMPARISON:  PET-CT 10/18/2021 FINDINGS: Mediastinal blood pool activity: SUV max 2.0 Liver activity: SUV max NA NECK: Residual hypermetabolic activity in the LEFT palatine tonsil region with SUV max equal 6.1 decreased SUV max equal 12.7. Persistent hypermetabolic LEFT level 2 lymph node SUV max equal 6.0 compared SUV max equal 6.3. Node is reduced in size measuring 6 mm decreased from 9 mm (image 31). No new hypermetabolic lymph nodes in the neck. Incidental CT findings: none CHEST: Unfortunately, there several new hypermetabolic pulmonary nodules. For example LEFT upper lobe pulmonary nodule measuring 8 mm (image 72/4) SUV max equal 5.8. Left lower lobe nodule centrally at the hila measuring 7 mm (image 78/4) with SUV max equal 4.3 LEFT lower lobe nodule measuring 9 mm the medial LEFT lower lobe on image 91 with SUV max equal 6.7. In the RIGHT upper lobe medial 8 mm nodule on image 85 with SUV max equal 4.0 Metabolic activity associated with nodule in the RIGHT chest wall with SUV max 3.6 on image 89 which is favored benign breast tissue. No hypermetabolic mediastinal lymph nodes. Incidental CT findings: Port in the anterior chest wall with tip in distal SVC. ABDOMEN/PELVIS: No abnormal hypermetabolic activity within the liver, pancreas, adrenal glands, or spleen. No hypermetabolic lymph nodes in the abdomen or pelvis. Incidental CT findings: Chronic calcifications the pancreas. SKELETON: No focal hypermetabolic activity to suggest  skeletal metastasis. Incidental CT findings: none IMPRESSION: 1. Unfortunately, new bilateral small hypermetabolic pulmonary nodular metastasis. 2. Residual hypermetabolic activity in the LEFT palatine tonsil region. 3. Residual metabolic activity associated with a LEFT level II cervical lymph node. 4. No new adenopathy in the neck. Electronically Signed   By: Suzy Bouchard M.D.   On: 04/15/2022 17:01    PERFORMANCE STATUS (ECOG) : 1 - Symptomatic but completely ambulatory  ROS Fatigue Left side tonsilar  pain/discomfort Appetite changes  Left arm pain due to fracture s/p fall Unless otherwise noted, a complete review of systems is negative.   IMPRESSION: This is my initial visit with [name] [name]. Unfortunately he missed a step outside of his home and fell resulting in humerus fracture. States he recently saw Orthopedic provider with no plans for surgery at this time due to his cancer. Goal of follow-up x-ray in several weeks. No acute distress.   I introduced myself, Maygan RN, and Palliative's role in collaboration with the oncology team. Concept of Palliative Care was introduced as specialized medical care for people and their families living with serious illness.  It focuses on providing relief from the symptoms and stress of a serious illness.  The goal is to improve quality of life for both the patient and the family. Values and goals of care important to patient and family were attempted to be elicited.   [name] [name] lives in the home with his wife. He has 3 children and grandchildren from previous marriage. 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  [name] [name] is independent of all ADLs. Some limitations due to recent arm injury. Endorses decreased appetite due to taste changes and left tonsilar pain.   Neoplasm related pain [name] [name] states he has ongoing left tonsilar/neck pain. Worsens when eating certain foods. Also now with pain due to arm  fracture.   We discussed pain regimen. He has oxycodone IR at home but has not been taking due to drowsiness effects. Education provided on use of medications to hopefully gain some control over his pain and improve his quality of life. [name] [name] verbalized understanding.   Education provided on use of immediate release pain medication at bedtime or during the day when pain is intense. He reports pain is increased with movement or certain activities. Tonsilar pain is intermittent and worst with eating certain foods.   I recommended use of more long-acting medication to assist in pain management given he does not like taking immediate release and pain is limiting his quality of life. He is in agreement with trying with hopes of some relief.   Education provided on use of Oxycodone ER (Xtampza) every 12 hours. If concerns with insurance authorization we could consider MS Contin although he reports minimal relief in the past with morphine use.   We will continue to closely monitor.   Constipation Is taking daily miralax. Denies any concerns for constipation at this time. Education provided on bowel regimen in the setting of opioid use.   Goals of Care  We discussed his current illness and what it means in the larger context of Her on-going co-morbidities. Natural disease trajectory and expectations were discussed.  [name] [name] is in preparation for visiting [facility] oncology team for second opinion regarding his cancer and any necessary treatments.   He is clear in expressed wishes to continue to treat the treatable allowing him every opportunity to continue to thrive as best possible.   He has a documented advanced directive. His wife [name] [name] is his Scientist, research (medical). Documents confirm DNR and No Feeding Tubes.   I discussed the importance of continued conversation with family and their medical providers regarding overall plan of care and treatment options, ensuring decisions are within the context of  the patients values and GOCs.  PLAN: Established therapeutic relationship. Education provided on palliative's role in collaboration with his oncology/radiation team.  Oxycodone ER [name] [name]) '9mg'$  every 12 hours Oxy IR as needed for breakthrough  pain Miralax daily for bowel regimen I will plan to see patient back in 2-4 weeks in collaboration to other oncology appointments. We will plan to follow-up by phone in a week for close monitoring of pain.    Patient expressed understanding and was in agreement with this plan. He also understands that He can call the clinic at any time with any questions, concerns, or complaints.   Thank you for your referral and allowing Palliative to assist in GeorgeGeorge [name]'s care.   Number and complexity of problems addressed: 3 HIGH - 1 or more chronic illnesses with SEVERE exacerbation, progression, or side effects of treatment - advanced cancer, pain. Any controlled substances utilized were prescribed in the context of palliative care.  Time Total: 50 min   Visit consisted of counseling and education dealing with the complex and emotionally intense issues of symptom management and palliative care in the setting of serious and potentially life-threatening illness.Greater than 50%  of this time was spent counseling and coordinating care related to the above assessment and plan.  Signed by: Alda Lea, AGPCNP-BC Palliative Medicine Team/[facility]

## 2022-04-27 ENCOUNTER — Telehealth: Payer: Self-pay | Admitting: Orthopedic Surgery

## 2022-04-27 ENCOUNTER — Telehealth: Payer: Self-pay | Admitting: Hematology and Oncology

## 2022-04-27 DIAGNOSIS — S42292A Other displaced fracture of upper end of left humerus, initial encounter for closed fracture: Secondary | ICD-10-CM

## 2022-04-27 NOTE — Telephone Encounter (Signed)
Patient requests to cancel his follow up fracture care appointment which we have done per his request due to patient stating he is scheduling appointment with orthopaedic surgeon at [facility]. [facility] records request is in progress.

## 2022-04-27 NOTE — Telephone Encounter (Signed)
He is to come back in per Dr Aline Brochure note His [facility] appointment is for Oncology not Orthopedics

## 2022-04-27 NOTE — Telephone Encounter (Signed)
I spoke with patient again to offer re-schedule of his follow up fracture care appointment and he maintains that he wishes to have Dr Aline Brochure make the referral to [facility], and he declined returning to office for the follow up here for next week.

## 2022-04-27 NOTE — Telephone Encounter (Signed)
Scheduled appointment per provider. Patient aware.  

## 2022-04-27 NOTE — Telephone Encounter (Signed)
Patient called back to request that we make the referral to [facility]. Per previous note, patient said he verified that his current referral to [facility] is only regarding his oncology care. Please advise.

## 2022-04-28 ENCOUNTER — Encounter: Payer: Self-pay | Admitting: Hematology and Oncology

## 2022-04-28 NOTE — Telephone Encounter (Signed)
ok 

## 2022-04-28 NOTE — Progress Notes (Signed)
[facility] CSW Counseling Note  Patient was referred by medical provider. Treatment type: Individual  Presenting Concerns: Patient and/or family reports the following symptoms/concerns: anxiety Duration of problem: 6 months months; Severity of problem: moderate   Orientation:oriented to person, place, time/date, and situation.   Affect: Appropriate Risk of harm to self or others: No plan to harm self or others  Patient and/or Family's Strengths/Protective Factors: Social connections, Social and Emotional competence, Concrete supports in place (healthy food, safe environments, etc.), Sense of purpose, and Physical Health (exercise, healthy diet, medication compliance, etc.)Ability for insight  Active sense of humor  Average or above average intelligence  Capable of independent living  Engineer, drilling fund of knowledge  Motivation for treatment/growth  Physical Health  Supportive family/friends      Goals Addressed: Patient will:  Reduce symptoms of: anxiety Increase knowledge and/or ability of: coping skills and self-management skills  Increase healthy adjustment to current life circumstances and Increase adequate support systems for patient/family   Progress towards Goals: Initial   Interventions: Interventions utilized:  Solution Focused and Supportive      Assessment: Patient currently experiencing anxiety related to cancer diagnosis and effectiveness of treatment in addition to losing both parents w/in months of one another just prior to diagnosis.  Pt has not had the opportunity to grieve the loss of either parent.  Pt has completed initial treatment and will now follow up at [facility] to determine future treatment.  Processing diagnosis, mortality and quality of life are primary concerns at present.  Pt has a supportive spouse and children; however, is trying to navigate preparing his family both emotionally and practically should his life expectancy  not be what they had hoped for following treatment.  Supportive counseling provided.  CSW provided pt w/ the opportunity to verbalize his own fears and assisted w/ recognizing his family's need to process his diagnosis in their own individual way.     Patient may benefit from support group pending [facility]'s assessment.  CSW also discussed supportive services for pt's family to assist w/ their processing.   Plan: Follow up with CSW: in 2 weeks Behavioral recommendations:  Referral(s):        Henriette Combs, LCSW

## 2022-04-29 NOTE — Telephone Encounter (Signed)
Call received from patient at 3:32pm today, Friday, 04/29/22, asking about the referral he requested to [facility]. I relayed, per clinic notes, that the referral was done, and faxed to [place] yesterday morning, 04/28/22. Patient said that he was given his appointment back here with Dr Aline Brochure, which he previously requested to cancel, to come in Thursday, 05/05/22, 4:30pm. He states he was advised by [facility] that they were to be getting records from our office, as he has already been scheduled, following his [place] oncology appointment on the morning of 05/05/22 - said he is to see [facility] at 2:00pm,on Thursday, 05/05/22; therefore, he would not be coming to the 05/05/22 afternoon appointment with Dr Aline Brochure. Again is asking for our office to cancel it.

## 2022-05-02 ENCOUNTER — Telehealth: Payer: Self-pay | Admitting: Hematology and Oncology

## 2022-05-02 NOTE — Telephone Encounter (Signed)
Done as patient requested.

## 2022-05-02 NOTE — Telephone Encounter (Signed)
Per 7/31 phone line pt called to cancel appointment  Pt will call back at a later time to r/s  appointment canceled per pt request

## 2022-05-03 ENCOUNTER — Other Ambulatory Visit: Payer: Self-pay

## 2022-05-03 ENCOUNTER — Inpatient Hospital Stay: Payer: No Typology Code available for payment source | Admitting: Hematology and Oncology

## 2022-05-05 ENCOUNTER — Telehealth: Payer: Self-pay

## 2022-05-05 ENCOUNTER — Encounter: Payer: No Typology Code available for payment source | Admitting: Orthopedic Surgery

## 2022-05-05 NOTE — Telephone Encounter (Signed)
Attempted to call pt to check in,  no answer, LVM and callback number 

## 2022-05-06 ENCOUNTER — Telehealth: Payer: Self-pay | Admitting: Orthopedic Surgery

## 2022-05-06 ENCOUNTER — Telehealth: Payer: Self-pay | Admitting: *Deleted

## 2022-05-06 NOTE — Telephone Encounter (Signed)
Received medical records request from [place] signed by patient for most recent PET scan to be pushed thru to [facility] and records per radiation oncology .  This RN forwarded request for [facility] to radiology per call and faxed request for radiation records to Dr Pearlie Oyster nurse for further follow up.  Noted confirmation of receipt to above units.

## 2022-05-06 NOTE — Telephone Encounter (Signed)
Call received from patient, who relays he received a call from orthopaedic department, states 'met by phone', and has been advised that he return to see Dr Aline Brochure, since he is local; states no need to follow up for his fracture care at [facility] since Dr Aline Brochure is already doing what they would do. States he saw his oncology doctor at [facility] yesterday as well, 05/05/22, who verified as well, that he did not have to see [facility] orthopaedics. Okay to reschedule the appointments patient elected to cancel?

## 2022-05-06 NOTE — Telephone Encounter (Signed)
Called patient to reschedule appointment; aware

## 2022-05-09 ENCOUNTER — Encounter: Payer: Self-pay | Admitting: Orthopedic Surgery

## 2022-05-09 ENCOUNTER — Ambulatory Visit (INDEPENDENT_AMBULATORY_CARE_PROVIDER_SITE_OTHER): Payer: No Typology Code available for payment source | Admitting: Orthopedic Surgery

## 2022-05-09 ENCOUNTER — Ambulatory Visit (INDEPENDENT_AMBULATORY_CARE_PROVIDER_SITE_OTHER): Payer: No Typology Code available for payment source

## 2022-05-09 DIAGNOSIS — S42292D Other displaced fracture of upper end of left humerus, subsequent encounter for fracture with routine healing: Secondary | ICD-10-CM | POA: Diagnosis not present

## 2022-05-09 DIAGNOSIS — S42202D Unspecified fracture of upper end of left humerus, subsequent encounter for fracture with routine healing: Secondary | ICD-10-CM | POA: Insufficient documentation

## 2022-05-09 NOTE — Patient Instructions (Signed)
Physical therapy has been ordered for you at [facility] They should call you to schedule, [phone]  is the phone number to call, if you want to call to schedule.   

## 2022-05-09 NOTE — Addendum Note (Signed)
Addended byCandice Camp on: 05/09/2022 09:40 AM   Modules accepted: Orders

## 2022-05-09 NOTE — Progress Notes (Signed)
FOLLOW UP   Encounter Diagnosis  Name Primary?   Other closed displaced fracture of proximal end of left humerus with routine healing, subsequent encounter 04/22/22 Yes     Chief Complaint  Patient presents with   Shoulder Injury    LEFT DOI 04/22/22    18 days post proximal humerus fracture.  Case complicated by the patient's recent treatment for cancer.  X-rays show no major change in the position of the fracture fragments  Patient tells me his appointment at [facility] revealed the following: 3 specialists looked at the bone / no surgery / Orthopaedics called him    Start therapy on the shoulder   Check 6 weeks ROM

## 2022-05-11 ENCOUNTER — Encounter: Payer: No Typology Code available for payment source | Admitting: Orthopedic Surgery

## 2022-05-11 DIAGNOSIS — C760 Malignant neoplasm of head, face and neck: Secondary | ICD-10-CM | POA: Insufficient documentation

## 2022-05-11 DIAGNOSIS — C4442 Squamous cell carcinoma of skin of scalp and neck: Secondary | ICD-10-CM | POA: Insufficient documentation

## 2022-05-17 ENCOUNTER — Telehealth: Payer: Self-pay

## 2022-05-17 NOTE — Telephone Encounter (Signed)
Notified Patient of completion of Cancer Claim Form. Copy of Form e-mailed to [email or web address] as requested. No other needs or concerns voiced at this time.

## 2022-06-03 ENCOUNTER — Encounter: Payer: Self-pay | Admitting: Family Medicine

## 2022-06-03 ENCOUNTER — Ambulatory Visit (INDEPENDENT_AMBULATORY_CARE_PROVIDER_SITE_OTHER): Payer: No Typology Code available for payment source | Admitting: Family Medicine

## 2022-06-03 VITALS — BP 91/60 | HR 89 | Ht 68.0 in | Wt 147.0 lb

## 2022-06-03 DIAGNOSIS — C099 Malignant neoplasm of tonsil, unspecified: Secondary | ICD-10-CM

## 2022-06-03 DIAGNOSIS — Z1159 Encounter for screening for other viral diseases: Secondary | ICD-10-CM

## 2022-06-03 DIAGNOSIS — R7301 Impaired fasting glucose: Secondary | ICD-10-CM | POA: Diagnosis not present

## 2022-06-03 DIAGNOSIS — Z114 Encounter for screening for human immunodeficiency virus [HIV]: Secondary | ICD-10-CM

## 2022-06-03 DIAGNOSIS — G8929 Other chronic pain: Secondary | ICD-10-CM

## 2022-06-03 DIAGNOSIS — S4992XD Unspecified injury of left shoulder and upper arm, subsequent encounter: Secondary | ICD-10-CM

## 2022-06-03 DIAGNOSIS — Z2821 Immunization not carried out because of patient refusal: Secondary | ICD-10-CM | POA: Diagnosis not present

## 2022-06-03 DIAGNOSIS — M545 Low back pain, unspecified: Secondary | ICD-10-CM

## 2022-06-03 DIAGNOSIS — E559 Vitamin D deficiency, unspecified: Secondary | ICD-10-CM | POA: Diagnosis not present

## 2022-06-03 NOTE — Progress Notes (Unsigned)
Established Patient Office Visit  Subjective:  Patient ID: [name] [name], male    DOB: 05/05/1956  Age: 66 y.o. MRN: [id number]  CC:  Chief Complaint  Patient presents with   Follow-up    3 month f/u     HPI [name] [name] is a 66 y.o. male with past medical history of left shoulder pain, back pain, carcinoma of the left tonsil presents for f/u of  chronic medical conditions. Left Shoulder injury: The patient had an injury of the left shoulder on 04/22/22 and sustained a comminuted humerus fracture proximally. He followed up with orthopedics on 05/09/22 Dr. Aline Brochure, who recommended physical therapy. He has been attending physical therapy and reports that his left should feel better. He is seen in the office wearing his shoulder sling.  Back pain: he reports minimum relief of symptoms with the sciatica nerve stimulator and plans to have it taken out on 07/03/22. He reports taking narcotics for his back pain; however, the medication makes him tired.  Squamous cell carcinoma of left tonsil: A PET scan on 04/14/22 showed multiple nodules in his chest that are highly suspicious for metastatic disease. The PET scan also showed persistent inflammatory avidity in the left palatine tonsil. He follows up with [facility] with plans for a biopsy of the nodules seen. He reports a new onset of a lump on his right breast below his nipple. He denies breast lump tenderness, nipple discharge, pain, and swelling.        Past Medical History:  Diagnosis Date   Acute kidney failure ([place]) 04/2018   CKD (chronic kidney disease), stage II    Complication of anesthesia    DDD (degenerative disc disease), lumbar    History of pancreatitis    20-30 years ago   History of venomous snake bite    rt index finger-   Hypercholesterolemia    Hypertension    Nerve pain    Right leg, resolved with stimulator   PONV (postoperative nausea and vomiting)    S/P insertion of spinal cord stimulator 08/2017   Spinal stenosis     Tobacco abuse     Past Surgical History:  Procedure Laterality Date   COLONOSCOPY     FINGER EXPLORATION  05/2011   rt index finger [id number]   HERNIA REPAIR     as a child   I & D EXTREMITY  11/24/2011   Procedure: IRRIGATION AND DEBRIDEMENT EXTREMITY;  Surgeon: Tennis Must, MD;  Location: [place];  Service: Orthopedics;  Laterality: Right;  right index   IR IMAGING GUIDED PORT INSERTION  11/17/2021   IR US GUIDE VASC ACCESS RIGHT  11/17/2021   MASS EXCISION  01/23/2012   Procedure: EXCISION MASS;  Surgeon: Tennis Must, MD;  Location: [facility];  Service: Orthopedics;  Laterality: Right;  Right index a-cell graft   TOTAL HIP ARTHROPLASTY Right 05/03/2018   Procedure: RIGHT TOTAL HIP ARTHROPLASTY ANTERIOR APPROACH;  Surgeon: Rod Can, MD;  Location: [facility];  Service: Orthopedics;  Laterality: Right;  Needs RNFA    Family History  Problem Relation Age of Onset   Diabetes Mother    Throat cancer Brother     Social History   Socioeconomic History   Marital status: Married    Spouse name: Not on file   Number of children: Not on file   Years of education: Not on file   Highest education level: Not on file  Occupational History  Not on file  Tobacco Use   Smoking status: Former    Packs/day: 0.50    Years: 20.00    Total pack years: 10.00    Types: Cigarettes   Smokeless tobacco: Never  Vaping Use   Vaping Use: Never used  Substance and Sexual Activity   Alcohol use: Yes    Comment: occasional   Drug use: No   Sexual activity: Not Currently  Other Topics Concern   Not on file  Social History Narrative   Not on file   Social Determinants of Health   Financial Resource Strain: Low Risk  (11/22/2021)   Overall Financial Resource Strain (CARDIA)    Difficulty of Paying Living Expenses: Not hard at all  Food Insecurity: No Food Insecurity (11/22/2021)   Hunger Vital Sign    Worried About Running Out of Food in the Last  Year: Never true    Ran Out of Food in the Last Year: Never true  Transportation Needs: No Transportation Needs (11/22/2021)   PRAPARE - Hydrologist (Medical): No    Lack of Transportation (Non-Medical): No  Physical Activity: Not on file  Stress: Not on file  Social Connections: Not on file  Intimate Partner Violence: Not on file    Outpatient Medications Prior to Visit  Medication Sig Dispense Refill   lidocaine (XYLOCAINE) 2 % solution Patient: Mix 1part 2% viscous lidocaine, 1part H20. Swish & swallow 6mL of diluted mixture, 9min before meals and at bedtime, up to QID 200 mL 3   morphine (MS CONTIN) 15 MG 12 hr tablet Take by mouth.     olmesartan-hydrochlorothiazide (BENICAR HCT) 40-25 MG per tablet Take 1 tablet by mouth daily.       rosuvastatin (CRESTOR) 10 MG tablet Take 1 tablet (10 mg total) by mouth daily. 90 tablet 0   traZODone (DESYREL) 50 MG tablet Take 1 tablet (50 mg total) by mouth at bedtime as needed for sleep. 30 tablet 3   zolpidem (AMBIEN) 5 MG tablet Take 1 tablet (5 mg total) by mouth at bedtime as needed for sleep. 30 tablet 0   Multiple Vitamins-Minerals (CENTRUM ULTRA MENS) TABS Take 1 tablet by mouth daily.       amoxicillin-clavulanate (AUGMENTIN) 875-125 MG tablet Take 1 tablet by mouth 2 (two) times daily. 14 tablet 0   chlorproMAZINE (THORAZINE) 10 MG tablet TAKE 1 TABLET(10 MG) BY MOUTH THREE TIMES DAILY AS NEEDED (Patient not taking: Reported on 06/03/2022) 10 tablet 0   dexamethasone (DECADRON) 4 MG tablet Take 2 tablets (8 mg total) by mouth daily. Take daily x 3 days starting the day after cisplatin chemotherapy. Take with food. (Patient not taking: Reported on 06/03/2022) 30 tablet 1   lidocaine-prilocaine (EMLA) cream Apply to affected area once (Patient not taking: Reported on 06/03/2022) 30 g 3   LORazepam (ATIVAN) 0.5 MG tablet Take 1 tablet (0.5 mg total) by mouth every 6 (six) hours as needed (Nausea or vomiting). (Patient  not taking: Reported on 06/03/2022) 30 tablet 0   ondansetron (ZOFRAN) 8 MG tablet Take 1 tablet (8 mg total) by mouth 2 (two) times daily as needed. Start on the third day after cisplatin chemotherapy. (Patient not taking: Reported on 06/03/2022) 30 tablet 1   oxyCODONE ER (XTAMPZA ER) 9 MG C12A Take 9 mg by mouth every 12 (twelve) hours. (Patient not taking: Reported on 06/03/2022) 60 capsule 0   No facility-administered medications prior to visit.    Allergies  Allergen Reactions   Codeine Other (See Comments)    "Gives me a headache for days."    ROS Review of Systems  HENT:  Negative for sinus pressure and sinus pain.   Endocrine: Negative for polydipsia, polyphagia and polyuria.  Genitourinary:  Negative for difficulty urinating and flank pain.  Musculoskeletal:  Positive for back pain.  Skin:        Breast lump on the right breast below the nipple  Psychiatric/Behavioral:  Negative for self-injury and suicidal ideas.       Objective:    Physical Exam HENT:     Head: Normocephalic.  Cardiovascular:     Rate and Rhythm: Normal rate and regular rhythm.     Pulses: Normal pulses.     Heart sounds: Normal heart sounds.  Pulmonary:     Effort: Pulmonary effort is normal.     Breath sounds: Normal breath sounds.  Chest:  Breasts:    Right: Mass present. No swelling, bleeding, inverted nipple, nipple discharge, skin change or tenderness.     Left: No mass.  Neurological:     Mental Status: He is alert.     BP 91/60 (BP Location: Right Arm, Patient Position: Sitting, Cuff Size: Normal)   Pulse 89   Ht $R'5\' 8"'ru$  (1.727 m)   Wt 147 lb (66.7 kg)   SpO2 94%   BMI 22.35 kg/m  Wt Readings from Last 3 Encounters:  06/03/22 147 lb (66.7 kg)  04/25/22 152 lb 9.6 oz (69.2 kg)  04/22/22 151 lb 14.4 oz (68.9 kg)    Lab Results  Component Value Date   TSH 1.099 11/24/2021   Lab Results  Component Value Date   WBC 4.3 03/02/2022   HGB 11.4 (L) 03/02/2022   HCT 32.7 (L)  03/02/2022   MCV 104.8 (H) 03/02/2022   PLT 153 03/02/2022   Lab Results  Component Value Date   NA 133 (L) 03/02/2022   K 3.8 03/02/2022   CO2 23 03/02/2022   GLUCOSE 109 (H) 03/02/2022   BUN 18 03/02/2022   CREATININE 0.40 (L) 03/02/2022   BILITOT 0.6 03/02/2022   ALKPHOS 60 03/02/2022   AST 34 03/02/2022   ALT 31 03/02/2022   PROT 6.5 03/02/2022   ALBUMIN 3.9 03/02/2022   CALCIUM 9.1 03/02/2022   ANIONGAP 5 03/02/2022   No results found for: "CHOL" No results found for: "HDL" No results found for: "LDLCALC" No results found for: "TRIG" No results found for: "CHOLHDL" No results found for: "HGBA1C"    Assessment & Plan:   Problem List Items Addressed This Visit       Respiratory   Squamous cell carcinoma of left tonsil ([place]) - Primary    A PET scan on 04/14/22 showed multiple nodules in his chest that are highly suspicious for metastatic disease The PET scan also showed persistent inflammatory avidity in the left palatine tonsil He follows up with [facility] with plans for a biopsy of the nodules seen  He reports a new onset of a lump on his right breast below his nipple He denies breast lump tenderness, nipple discharge, pain, and swelling Encouraged to f/u with oncology at [facility] and report new findings        Other   Refused influenza vaccine   Low back pain    He reports minimum relief of symptoms with the sciatica nerve stimulator and plans to have it taken out on 07/03/22 He reports taking narcotics for his back pain; however, the medication  makes him tired Encouraged to continue to f/u with Dr.Jenkins, his neurologist, concerning his sciatica nerve stimulator removal       Relevant Medications   morphine (MS CONTIN) 15 MG 12 hr tablet   Injury of left shoulder    The patient had an injury of the left shoulder on 04/22/22 and sustained a comminuted humerus fracture proximally  He followed up with orthopedics on 05/09/22 Dr. Aline Brochure, who recommended physical  therapy  He has been attending physical therapy and reports that his left should feel better. He is seen in the office wearing his shoulder sling Encouraged to continue PT and f/u with Dr. Aline Brochure on 06/20/22       Other Visit Diagnoses     Vitamin D deficiency       Relevant Orders   Vitamin D (25 hydroxy)   IFG (impaired fasting glucose)       Relevant Orders   CMP14+EGFR   CBC with Differential/Platelet   Lipid Profile   Hemoglobin A1C   TSH + free T4   Encounter for screening for HIV       Relevant Orders   HIV antibody (with reflex)   Need for hepatitis C screening test       Relevant Orders   Hepatitis C Antibody       No orders of the defined types were placed in this encounter.   Follow-up: Return in about 4 months (around 10/03/2022) for CPE.    [name], FNP

## 2022-06-03 NOTE — Patient Instructions (Addendum)
I appreciate the opportunity to provide care to you today!    Follow up:  4 months CPE  Labs: please stop by the lab tomorrow to get your blood drawn (CBC, CMP, TSH, Lipid profile, HgA1c, Vit D)  Screening: HIV and Hep C     Please continue to a heart-healthy diet and increase your physical activities. Try to exercise for 46mns at least three times a week.      It was a pleasure to see you and I look forward to continuing to work together on your health and well-being. Please do not hesitate to call the office if you need care or have questions about your care.   Have a wonderful day and week. With Gratitude, GAlvira MondayMSN, FNP-BC

## 2022-06-06 DIAGNOSIS — S4992XA Unspecified injury of left shoulder and upper arm, initial encounter: Secondary | ICD-10-CM | POA: Insufficient documentation

## 2022-06-06 DIAGNOSIS — M545 Low back pain, unspecified: Secondary | ICD-10-CM | POA: Insufficient documentation

## 2022-06-06 NOTE — Assessment & Plan Note (Signed)
The patient had an injury of the left shoulder on 04/22/22 and sustained a comminuted humerus fracture proximally  He followed up with orthopedics on 05/09/22 Dr. Aline Brochure, who recommended physical therapy  He has been attending physical therapy and reports that his left should feel better. He is seen in the office wearing his shoulder sling Encouraged to continue PT and f/u with Dr. Aline Brochure on 06/20/22

## 2022-06-06 NOTE — Assessment & Plan Note (Addendum)
A PET scan on 04/14/22 showed multiple nodules in his chest that are highly suspicious for metastatic disease The PET scan also showed persistent inflammatory avidity in the left palatine tonsil He follows up with [facility] with plans for a biopsy of the nodules seen  He reports a new onset of a lump on his right breast below his nipple He denies breast lump tenderness, nipple discharge, pain, and swelling Encouraged to f/u with oncology at [facility] and report new findings

## 2022-06-06 NOTE — Assessment & Plan Note (Signed)
He reports minimum relief of symptoms with the sciatica nerve stimulator and plans to have it taken out on 07/03/22 He reports taking narcotics for his back pain; however, the medication makes him tired Encouraged to continue to f/u with Dr.Jenkins, his neurologist, concerning his sciatica nerve stimulator removal

## 2022-06-20 ENCOUNTER — Encounter: Payer: Self-pay | Admitting: Orthopedic Surgery

## 2022-06-20 ENCOUNTER — Ambulatory Visit (INDEPENDENT_AMBULATORY_CARE_PROVIDER_SITE_OTHER): Payer: No Typology Code available for payment source | Admitting: Orthopedic Surgery

## 2022-06-20 ENCOUNTER — Ambulatory Visit (INDEPENDENT_AMBULATORY_CARE_PROVIDER_SITE_OTHER): Payer: No Typology Code available for payment source

## 2022-06-20 DIAGNOSIS — S42292D Other displaced fracture of upper end of left humerus, subsequent encounter for fracture with routine healing: Secondary | ICD-10-CM

## 2022-06-20 NOTE — Patient Instructions (Addendum)
Physical therapy has been ordered for you at [facility] They should call you to schedule, [phone]  is the phone number to call, if you want to call to schedule.   

## 2022-06-20 NOTE — Progress Notes (Signed)
FOLLOW UP   Encounter Diagnosis  Name Primary?   Other closed displaced fracture of proximal end of left humerus with routine healing, subsequent encounter Yes     Chief Complaint  Patient presents with   Shoulder Injury    LEFT shoulder DOI 04/22/22   Allergies  Allergen Reactions   Codeine Other (See Comments)    "Gives me a headache for days."   Current Outpatient Medications  Medication Instructions   lidocaine (XYLOCAINE) 2 % solution Patient: Mix 1part 2% viscous lidocaine, 1part H20. Swish & swallow 59m of diluted mixture, 369m before meals and at bedtime, up to QID   morphine (MS CONTIN) 15 MG 12 hr tablet Oral   Multiple Vitamins-Minerals (CENTRUM ULTRA MENS) TABS 1 tablet, Daily   olmesartan-hydrochlorothiazide (BENICAR HCT) 40-25 MG per tablet 1 tablet, Daily   rosuvastatin (CRESTOR) 10 mg, Oral, Daily,     traZODone (DESYREL) 50 mg, Oral, At bedtime PRN   zolpidem (AMBIEN) 5 mg, Oral, At bedtime PRN     [name] [name] not been able to get physical therapy other than 1 visit he is doing his home exercises he tolerates his sling off in the home  Repeat imaging shows the fracture is stable  Recommend physical therapy follow-up in 4 to 6 weeks

## 2022-07-25 ENCOUNTER — Encounter: Payer: Self-pay | Admitting: Hematology and Oncology

## 2022-07-25 ENCOUNTER — Encounter (HOSPITAL_COMMUNITY): Payer: Self-pay | Admitting: Emergency Medicine

## 2022-07-25 ENCOUNTER — Telehealth: Payer: Self-pay | Admitting: *Deleted

## 2022-07-25 ENCOUNTER — Emergency Department (HOSPITAL_COMMUNITY): Payer: Medicare Other

## 2022-07-25 ENCOUNTER — Other Ambulatory Visit: Payer: Self-pay

## 2022-07-25 ENCOUNTER — Inpatient Hospital Stay (HOSPITAL_COMMUNITY)
Admission: EM | Admit: 2022-07-25 | Discharge: 2022-07-27 | DRG: 871 | Disposition: A | Discharge status: Home / Self Care | Payer: Medicare Other | Attending: Internal Medicine | Admitting: Internal Medicine

## 2022-07-25 DIAGNOSIS — A419 Sepsis, unspecified organism: Secondary | ICD-10-CM | POA: Diagnosis not present

## 2022-07-25 DIAGNOSIS — R6520 Severe sepsis without septic shock: Secondary | ICD-10-CM | POA: Diagnosis present

## 2022-07-25 DIAGNOSIS — I129 Hypertensive chronic kidney disease with stage 1 through stage 4 chronic kidney disease, or unspecified chronic kidney disease: Secondary | ICD-10-CM | POA: Diagnosis present

## 2022-07-25 DIAGNOSIS — G4700 Insomnia, unspecified: Secondary | ICD-10-CM | POA: Diagnosis present

## 2022-07-25 DIAGNOSIS — E871 Hypo-osmolality and hyponatremia: Secondary | ICD-10-CM | POA: Diagnosis present

## 2022-07-25 DIAGNOSIS — C7989 Secondary malignant neoplasm of other specified sites: Secondary | ICD-10-CM | POA: Diagnosis present

## 2022-07-25 DIAGNOSIS — E782 Mixed hyperlipidemia: Secondary | ICD-10-CM | POA: Diagnosis present

## 2022-07-25 DIAGNOSIS — C801 Malignant (primary) neoplasm, unspecified: Secondary | ICD-10-CM

## 2022-07-25 DIAGNOSIS — Z6822 Body mass index (BMI) 22.0-22.9, adult: Secondary | ICD-10-CM

## 2022-07-25 DIAGNOSIS — C44529 Squamous cell carcinoma of skin of other part of trunk: Secondary | ICD-10-CM | POA: Diagnosis present

## 2022-07-25 DIAGNOSIS — E876 Hypokalemia: Secondary | ICD-10-CM | POA: Diagnosis present

## 2022-07-25 DIAGNOSIS — R7989 Other specified abnormal findings of blood chemistry: Secondary | ICD-10-CM | POA: Diagnosis present

## 2022-07-25 DIAGNOSIS — E8809 Other disorders of plasma-protein metabolism, not elsewhere classified: Secondary | ICD-10-CM | POA: Diagnosis present

## 2022-07-25 DIAGNOSIS — Z79899 Other long term (current) drug therapy: Secondary | ICD-10-CM

## 2022-07-25 DIAGNOSIS — E860 Dehydration: Secondary | ICD-10-CM | POA: Diagnosis present

## 2022-07-25 DIAGNOSIS — C782 Secondary malignant neoplasm of pleura: Secondary | ICD-10-CM | POA: Diagnosis present

## 2022-07-25 DIAGNOSIS — Z1152 Encounter for screening for COVID-19: Secondary | ICD-10-CM

## 2022-07-25 DIAGNOSIS — G893 Neoplasm related pain (acute) (chronic): Secondary | ICD-10-CM | POA: Diagnosis present

## 2022-07-25 DIAGNOSIS — J90 Pleural effusion, not elsewhere classified: Secondary | ICD-10-CM | POA: Diagnosis not present

## 2022-07-25 DIAGNOSIS — E46 Unspecified protein-calorie malnutrition: Secondary | ICD-10-CM | POA: Diagnosis present

## 2022-07-25 DIAGNOSIS — Z833 Family history of diabetes mellitus: Secondary | ICD-10-CM

## 2022-07-25 DIAGNOSIS — C7801 Secondary malignant neoplasm of right lung: Secondary | ICD-10-CM | POA: Diagnosis present

## 2022-07-25 DIAGNOSIS — C7802 Secondary malignant neoplasm of left lung: Secondary | ICD-10-CM | POA: Diagnosis present

## 2022-07-25 DIAGNOSIS — Z885 Allergy status to narcotic agent status: Secondary | ICD-10-CM

## 2022-07-25 DIAGNOSIS — R0902 Hypoxemia: Principal | ICD-10-CM

## 2022-07-25 DIAGNOSIS — J189 Pneumonia, unspecified organism: Secondary | ICD-10-CM | POA: Diagnosis present

## 2022-07-25 DIAGNOSIS — R9431 Abnormal electrocardiogram [ECG] [EKG]: Secondary | ICD-10-CM | POA: Diagnosis present

## 2022-07-25 DIAGNOSIS — Z87891 Personal history of nicotine dependence: Secondary | ICD-10-CM

## 2022-07-25 DIAGNOSIS — Z808 Family history of malignant neoplasm of other organs or systems: Secondary | ICD-10-CM

## 2022-07-25 DIAGNOSIS — E872 Acidosis, unspecified: Secondary | ICD-10-CM | POA: Diagnosis present

## 2022-07-25 DIAGNOSIS — N182 Chronic kidney disease, stage 2 (mild): Secondary | ICD-10-CM | POA: Diagnosis present

## 2022-07-25 DIAGNOSIS — E278 Other specified disorders of adrenal gland: Secondary | ICD-10-CM | POA: Diagnosis present

## 2022-07-25 DIAGNOSIS — J9601 Acute respiratory failure with hypoxia: Secondary | ICD-10-CM | POA: Diagnosis present

## 2022-07-25 DIAGNOSIS — J910 Malignant pleural effusion: Secondary | ICD-10-CM | POA: Diagnosis present

## 2022-07-25 DIAGNOSIS — Z85818 Personal history of malignant neoplasm of other sites of lip, oral cavity, and pharynx: Secondary | ICD-10-CM

## 2022-07-25 DIAGNOSIS — Z96641 Presence of right artificial hip joint: Secondary | ICD-10-CM | POA: Diagnosis present

## 2022-07-25 HISTORY — DX: Malignant (primary) neoplasm, unspecified: C80.1

## 2022-07-25 LAB — LACTIC ACID, PLASMA: Lactic Acid, Venous: 3 mmol/L (ref 0.5–1.9)

## 2022-07-25 LAB — CBC WITH DIFFERENTIAL/PLATELET
Abs Immature Granulocytes: 0.03 K/uL (ref 0.00–0.07)
Basophils Absolute: 0 K/uL (ref 0.0–0.1)
Basophils Relative: 0 %
Eosinophils Absolute: 0.1 K/uL (ref 0.0–0.5)
Eosinophils Relative: 1 %
HCT: 41 % (ref 39.0–52.0)
Hemoglobin: 13.7 g/dL (ref 13.0–17.0)
Immature Granulocytes: 0 %
Lymphocytes Relative: 2 %
Lymphs Abs: 0.3 K/uL — ABNORMAL LOW (ref 0.7–4.0)
MCH: 32.2 pg (ref 26.0–34.0)
MCHC: 33.4 g/dL (ref 30.0–36.0)
MCV: 96.2 fL (ref 80.0–100.0)
Monocytes Absolute: 0.7 K/uL (ref 0.1–1.0)
Monocytes Relative: 6 %
Neutro Abs: 10.8 K/uL — ABNORMAL HIGH (ref 1.7–7.7)
Neutrophils Relative %: 91 %
Platelets: 181 K/uL (ref 150–400)
RBC: 4.26 MIL/uL (ref 4.22–5.81)
RDW: 12.3 % (ref 11.5–15.5)
WBC: 11.9 K/uL — ABNORMAL HIGH (ref 4.0–10.5)
nRBC: 0 % (ref 0.0–0.2)

## 2022-07-25 LAB — COMPREHENSIVE METABOLIC PANEL WITH GFR
ALT: 13 U/L (ref 0–44)
AST: 32 U/L (ref 15–41)
Albumin: 2.6 g/dL — ABNORMAL LOW (ref 3.5–5.0)
Alkaline Phosphatase: 74 U/L (ref 38–126)
Anion gap: 13 (ref 5–15)
BUN: 9 mg/dL (ref 8–23)
CO2: 28 mmol/L (ref 22–32)
Calcium: 11.9 mg/dL — ABNORMAL HIGH (ref 8.9–10.3)
Chloride: 92 mmol/L — ABNORMAL LOW (ref 98–111)
Creatinine, Ser: 0.56 mg/dL — ABNORMAL LOW (ref 0.61–1.24)
GFR, Estimated: >60 mL/min
Glucose, Bld: 132 mg/dL — ABNORMAL HIGH (ref 70–99)
Potassium: 3.5 mmol/L (ref 3.5–5.1)
Sodium: 133 mmol/L — ABNORMAL LOW (ref 135–145)
Total Bilirubin: 0.9 mg/dL (ref 0.3–1.2)
Total Protein: 5.8 g/dL — ABNORMAL LOW (ref 6.5–8.1)

## 2022-07-25 LAB — PROTIME-INR
INR: 1.4 — ABNORMAL HIGH (ref 0.8–1.2)
Prothrombin Time: 17.2 s — ABNORMAL HIGH (ref 11.4–15.2)

## 2022-07-25 LAB — APTT: aPTT: 28 s (ref 24–36)

## 2022-07-25 LAB — BRAIN NATRIURETIC PEPTIDE: B Natriuretic Peptide: 45 pg/mL (ref 0.0–100.0)

## 2022-07-25 MED ORDER — HYDROMORPHONE HCL 1 MG/ML IJ SOLN
1.0000 mg | Freq: Once | INTRAMUSCULAR | Status: AC
  Administered 2022-07-25: 1 mg via INTRAVENOUS
  Filled 2022-07-25: qty 1

## 2022-07-25 MED ORDER — LACTATED RINGERS IV BOLUS
1000.0000 mL | Freq: Once | INTRAVENOUS | Status: AC
  Administered 2022-07-25: 1000 mL via INTRAVENOUS

## 2022-07-25 MED ORDER — SODIUM CHLORIDE 0.9 % IV BOLUS
1000.0000 mL | Freq: Once | INTRAVENOUS | Status: AC
  Administered 2022-07-25: 1000 mL via INTRAVENOUS

## 2022-07-25 NOTE — Telephone Encounter (Signed)
Patient left message on our machine(not a patient at our practice) stating that he felt like he was having a medical emergency and needed some advice on which ER to go to. We contacted patient and he stated he has a history of fluid build up on lungs and it is usually drained by specialist but they have not contacted him back. We advised patient to go to [facility] for evaluation and treatment and gave him the number to his PCP office.

## 2022-07-25 NOTE — ED Triage Notes (Signed)
Pt c/o sob, when ems arrived pt was 90% on room air. Pt is now on 6lpm via nasal cannula and 96%.

## 2022-07-25 NOTE — ED Provider Notes (Signed)
MSE note.  Patient has a history of tonsillar cancer.  He also has a history of having fluid in his right lung that was removed at [facility].  He was told that it was not cancerous.  Patient became short of breath today.  No fever no chills.  Patient is not on oxygen at home.  Physical exam patient is tachycardic and he is on 6 L of oxygen at 95%.  Patient will get a chest x-ray and septic labs have been started.   Milton Ferguson, MD 07/25/22 2250

## 2022-07-25 NOTE — ED Notes (Signed)
Critical value Lactic acid reported to [facility] at 2342 No new orders at this time.

## 2022-07-25 NOTE — ED Provider Notes (Signed)
[facility] Provider Note   CSN: [id number] Arrival date & time: 07/25/22  2210     History  Chief Complaint  Patient presents with   Shortness of [name] [name] is a 66 y.o. male.  66 year old male with history of squamous cell carcinoma and pleural effusions that presents the ER today secondary to shortness of breath.  Patient said progressively worsening shortness of breath for last 2 to 3 weeks since he had his last thoracentesis.  Patient states that he also has chronic pain and has been on his Dilaudid for quite a while now but took his last dose today and was told he could not get a refill due to some [place] state law.  States that shortly after that she started having worsening pain, chills and felt hot.  Has had a progressive nonproductive cough for the last couple weeks.  No measured temperatures at home.  No lower extremity swelling.  Eating drinking defecating normally.  Urinate normally.  Decreased appetite likely related to the cancer.   Shortness of Breath      Home Medications Prior to Admission medications   Medication Sig Start Date End Date Taking? Authorizing Provider  lidocaine (XYLOCAINE) 2 % solution Patient: Mix 1part 2% viscous lidocaine, 1part H20. Swish & swallow 70m of diluted mixture, 382m before meals and at bedtime, up to QID 04/26/22   Pickenpack-Cousar, AtCarlena SaxNP  Multiple Vitamins-Minerals (CENTRUM ULTRA MENS) TABS Take 1 tablet by mouth daily.      [provider]  olmesartan-hydrochlorothiazide (BENICAR HCT) 40-25 MG per tablet Take 1 tablet by mouth daily.      [provider]  rosuvastatin (CRESTOR) 10 MG tablet Take 1 tablet (10 mg total) by mouth daily. 11/24/21   IrBenay PikeMD  traZODone (DESYREL) 50 MG tablet Take 1 tablet (50 mg total) by mouth at bedtime as needed for sleep. 04/12/22   ZaAlvira MondayFNP  zolpidem (AMBIEN) 5 MG tablet Take 1 tablet (5 mg total) by mouth at  bedtime as needed for sleep. 04/13/22   ZaAlvira MondayFNP      Allergies    Codeine    Review of Systems   Review of Systems  Respiratory:  Positive for shortness of breath.     Physical Exam Updated Vital Signs BP 92/66   Pulse 98   Temp (!) 97.5 F (36.4 C) (Oral)   Resp 20   Ht '5\' 8"'$  (1.727 m)   Wt 66.7 kg   SpO2 96%   BMI 22.36 kg/m  Physical Exam Vitals and nursing note reviewed.  Constitutional:      Appearance: He is well-developed.  HENT:     Head: Normocephalic and atraumatic.  Cardiovascular:     Rate and Rhythm: Regular rhythm. Tachycardia present.  Pulmonary:     Effort: Pulmonary effort is normal. No respiratory distress.     Breath sounds: Examination of the left-upper field reveals rales. Examination of the right-middle field reveals decreased breath sounds. Examination of the right-lower field reveals decreased breath sounds. Examination of the left-lower field reveals rales. Decreased breath sounds and rales present.  Abdominal:     General: There is no distension.  Musculoskeletal:        General: Normal range of motion.     Cervical back: Normal range of motion.     Right lower leg: No edema.     Left lower leg: No edema.  Skin:  General: Skin is warm and dry.  Neurological:     Mental Status: He is alert.     ED Results / Procedures / Treatments   Labs (all labs ordered are listed, but only abnormal results are displayed) Labs Reviewed  LACTIC ACID, PLASMA - Abnormal; Notable for the following components:      Result Value   Lactic Acid, Venous 3.0 (*)    All other components within normal limits  LACTIC ACID, PLASMA - Abnormal; Notable for the following components:   Lactic Acid, Venous 2.2 (*)    All other components within normal limits  COMPREHENSIVE METABOLIC PANEL - Abnormal; Notable for the following components:   Sodium 133 (*)    Chloride 92 (*)    Glucose, Bld 132 (*)    Creatinine, Ser 0.56 (*)    Calcium 11.9 (*)     Total Protein 5.8 (*)    Albumin 2.6 (*)    All other components within normal limits  CBC WITH DIFFERENTIAL/PLATELET - Abnormal; Notable for the following components:   WBC 11.9 (*)    Neutro Abs 10.8 (*)    Lymphs Abs 0.3 (*)    All other components within normal limits  PROTIME-INR - Abnormal; Notable for the following components:   Prothrombin Time 17.2 (*)    INR 1.4 (*)    All other components within normal limits  URINALYSIS, ROUTINE W REFLEX MICROSCOPIC - Abnormal; Notable for the following components:   Specific Gravity, Urine >1.046 (*)    Ketones, ur 20 (*)    All other components within normal limits  TROPONIN I (HIGH SENSITIVITY) - Abnormal; Notable for the following components:   Troponin I (High Sensitivity) 20 (*)    All other components within normal limits  TROPONIN I (HIGH SENSITIVITY) - Abnormal; Notable for the following components:   Troponin I (High Sensitivity) 30 (*)    All other components within normal limits  CULTURE, BLOOD (ROUTINE X 2)  CULTURE, BLOOD (ROUTINE X 2)  URINE CULTURE  APTT  BRAIN NATRIURETIC PEPTIDE    EKG EKG Interpretation  Date/Time:  Monday July 25 2022 22:22:15 EDT Ventricular Rate:  132 PR Interval:  145 QRS Duration: 94 QT Interval:  339 QTC Calculation: 505 R Axis:   127 Text Interpretation: Sinus tachycardia Right axis deviation Low voltage, extremity and precordial leads Repol abnrm suggests ischemia, anterolateral Prolonged QT interval ST depression V1-V3, suggest recording posterior leads Baseline wander in lead(s) V3 ST depression in multiple leads that is new Confirmed by Merrily Pew [id number]) on 07/26/2022 12:05:32 AM  Radiology CT Angio Chest PE W and/or Wo Contrast  Result Date: 07/26/2022 CLINICAL DATA:  Pulmonary embolism suspected, high probability. Shortness of breath. EXAM: CT ANGIOGRAPHY CHEST WITH CONTRAST TECHNIQUE: Multidetector CT imaging of the chest was performed using the standard protocol during  bolus administration of intravenous contrast. Multiplanar CT image reconstructions and MIPs were obtained to evaluate the vascular anatomy. RADIATION DOSE REDUCTION: This exam was performed according to the departmental dose-optimization program which includes automated exposure control, adjustment of the mA and/or kV according to patient size and/or use of iterative reconstruction technique. CONTRAST:  191m OMNIPAQUE IOHEXOL 350 MG/ML SOLN COMPARISON:  04/14/2022. FINDINGS: Cardiovascular: Heart is normal in size and there is no pericardial effusion. There is mild atherosclerotic calcification of the aorta without evidence of aneurysm. The pulmonary trunk is normal in caliber. No definite evidence of pulmonary embolism. Evaluation of the segmental and subsegmental arteries is limited due to extensive  nodules and infiltrates. Mediastinum/Nodes: Enlarged lymph nodes are present in the mediastinum measuring up to 1.1 cm in the right paratracheal space. Hilar lymphadenopathy is noted bilaterally. No axillary lymphadenopathy. The thyroid gland, trachea, and esophagus are within normal limits. Lungs/Pleura: Diffuse scattered pulmonary nodules and parenchymal opacities are present in the lungs, increased in size and number from the prior exam. There is a small pleural effusion on the left and a moderate loculated pleural effusion on the right with right basilar compressive atelectasis or infiltrate. Pleural thickening is noted on the right with a nodular opacity in the anterior aspect of the right upper lobe extending into the mediastinal fat measuring 4.7 x 1.8 cm, axial image 42. No pneumothorax. Upper Abdomen: Adrenal nodules are present bilaterally and new from the previous exam. Musculoskeletal: A subareolar nodule is noted in the right breast measuring 3.1 cm. There is a nodule in the anterior chest wall on the right measuring 1 cm, axial image 26. Neurostimulator leads terminate in the thoracic spinal canal. No  acute or suspicious osseous abnormality. Review of the MIP images confirms the above findings. IMPRESSION: 1. No definite evidence of pulmonary embolism. Evaluation of the segmental and subsegmental arteries is limited due to pulmonary nodules and infiltrates. 2. Marked interval worsening of pulmonary nodules and parenchymal opacities bilaterally, compatible with known metastatic disease. Pleural thickening is also noted on the right with nodules extending into the mediastinal fat on the right, likely pleural metastasis. 3. Small left pleural effusion and moderate loculated right pleural effusion. 4. Compressive atelectasis versus infiltrate at the right lung base. 5. Bilateral adrenal nodules, new from the previous exam and suspicious for metastatic disease. 6. Subcutaneous nodules in the anterior chest wall on the right, suspicious for metastatic disease. 7. Aortic atherosclerosis. Electronically Signed   By: Brett Fairy M.D.   On: 07/26/2022 01:13   DG Chest Port 1 View  Result Date: 07/25/2022 CLINICAL DATA:  Questionable sepsis, shortness of breath. EXAM: PORTABLE CHEST 1 VIEW COMPARISON:  09/03/2008, 04/14/2022. FINDINGS: The heart size and mediastinal contours are within normal limits. There is atherosclerotic calcification of the aorta. Nodular and patchy airspace opacities are present in the lungs bilaterally. There is a small to moderate right pleural effusion with patchy atelectasis or infiltrate at the right lung base. No pneumothorax. No acute osseous abnormality. IMPRESSION: 1. Scattered nodular opacities bilaterally, compatible with known pulmonary nodular metastasis. 2. Patchy airspace opacities bilaterally with atelectasis or infiltrate at the right lung base. 3. Small to moderate right pleural effusion. Electronically Signed   By: Brett Fairy M.D.   On: 07/25/2022 23:08    Procedures .Critical Care  Performed by: Merrily Pew, MD Authorized by: Merrily Pew, MD   Critical care  provider statement:    Critical care time (minutes):  30   Critical care was necessary to treat or prevent imminent or life-threatening deterioration of the following conditions:  Sepsis   Critical care was time spent personally by me on the following activities:  Development of treatment plan with patient or surrogate, discussions with consultants, evaluation of patient's response to treatment, examination of patient, ordering and review of laboratory studies, ordering and review of radiographic studies, ordering and performing treatments and interventions, pulse oximetry, re-evaluation of patient's condition and review of old charts     Medications Ordered in ED Medications  lactated ringers infusion ( Intravenous New Bag/Given 07/26/22 0144)  cefTRIAXone (ROCEPHIN) 2 g in sodium chloride 0.9 % 100 mL IVPB (0 g Intravenous Stopped 07/26/22  0036)  azithromycin (ZITHROMAX) 500 mg in sodium chloride 0.9 % 250 mL IVPB (0 mg Intravenous Stopped 07/26/22 0144)  sodium chloride 0.9 % bolus 1,000 mL (0 mLs Intravenous Stopped 07/26/22 0057)  lactated ringers bolus 1,000 mL (0 mLs Intravenous Stopped 07/26/22 0144)  HYDROmorphone (DILAUDID) injection 1 mg (1 mg Intravenous Given 07/25/22 2356)  acetaminophen (TYLENOL) tablet 1,000 mg (1,000 mg Oral Given 07/26/22 0019)  iohexol (OMNIPAQUE) 350 MG/ML injection 100 mL (100 mLs Intravenous Contrast Given 07/26/22 0036)  ondansetron (ZOFRAN) injection 4 mg (4 mg Intravenous Given 07/26/22 0035)    ED Course/ Medical Decision Making/ A&P Clinical Course as of 07/26/22 0512  Mon Jul 25, 2022  2328 INR(!): 1.4 Reassuring if needs thoracentesis [JM]  2329 WBC(!): 11.9 Infection? [JM]  2329 DG Chest [facility] Viewed and independently interpreted as moderate right pleural effusion and diffuse lung opacities/edema. [JM]  2329 Patient has obvious pleural effusion on x-ray and is on oxygen to maintain saturations above 90 not normally on at home.  Is  tachycardic and mildly hypotensive.  Will need to rule out infection as cause.  Although he does have soft pressures and elevated heart rate no JVD or distant heart sounds to suggest large cardial effusion.  Will wait for labs give some pain medicine some fluids and check a rectal temperature.  Will consider thoracentesis afterwards depending on results. [JM]  Tue Jul 26, 2022  0003 Temp(!): 101.1 F (38.4 C) This in association with elevated lactic acid, cough and patchy areas on lungs concernin for sepsis from pulmonary source. Abx added on. Already getting fluids and bp improving. Will reassess for disposition.  [JM]  0004 Creatinine(!): 0.56 [JM]  0004 Albumin(!): 2.6 malnourishment [JM]  0004 NEUT#(!): 10.8 [JM]  0051 CT Angio Chest PE W and/or Wo Contrast [JM]    Clinical Course User Index [JM] Chevette Fee, Corene Cornea, MD                           Medical Decision Making Amount and/or Complexity of Data Reviewed Labs: ordered. Decision-making details documented in ED Course. Radiology: ordered. Decision-making details documented in ED Course. ECG/medicine tests: ordered.  Risk OTC drugs. Prescription drug management. Decision regarding hospitalization.   CT scan without evidence of PE but does redemonstrate pneumonia and loculated pleural effusion.  Antibiotics were started.  Blood pressure and heart rate improving with the fluids.  Patient's feel a bit better as well.  Discussed with hospitalist for admission.  Final Clinical Impression(s) / ED Diagnoses Final diagnoses:  Hypoxia  Community acquired pneumonia of right lower lobe of lung  Pleural effusion    Rx / DC Orders ED Discharge Orders     None         Ileah Falkenstein, Corene Cornea, MD 07/26/22 [phone]

## 2022-07-26 ENCOUNTER — Inpatient Hospital Stay (HOSPITAL_COMMUNITY): Payer: Medicare Other

## 2022-07-26 ENCOUNTER — Emergency Department (HOSPITAL_COMMUNITY): Payer: Medicare Other

## 2022-07-26 ENCOUNTER — Encounter: Payer: Self-pay | Admitting: Hematology and Oncology

## 2022-07-26 ENCOUNTER — Encounter (HOSPITAL_COMMUNITY): Payer: Self-pay | Admitting: Internal Medicine

## 2022-07-26 DIAGNOSIS — J90 Pleural effusion, not elsewhere classified: Secondary | ICD-10-CM | POA: Diagnosis present

## 2022-07-26 DIAGNOSIS — Z85818 Personal history of malignant neoplasm of other sites of lip, oral cavity, and pharynx: Secondary | ICD-10-CM | POA: Diagnosis not present

## 2022-07-26 DIAGNOSIS — A419 Sepsis, unspecified organism: Secondary | ICD-10-CM | POA: Diagnosis present

## 2022-07-26 DIAGNOSIS — R6520 Severe sepsis without septic shock: Secondary | ICD-10-CM | POA: Diagnosis present

## 2022-07-26 DIAGNOSIS — Z885 Allergy status to narcotic agent status: Secondary | ICD-10-CM | POA: Diagnosis not present

## 2022-07-26 DIAGNOSIS — E872 Acidosis, unspecified: Secondary | ICD-10-CM

## 2022-07-26 DIAGNOSIS — Z1152 Encounter for screening for COVID-19: Secondary | ICD-10-CM | POA: Diagnosis not present

## 2022-07-26 DIAGNOSIS — C44529 Squamous cell carcinoma of skin of other part of trunk: Secondary | ICD-10-CM | POA: Diagnosis present

## 2022-07-26 DIAGNOSIS — E46 Unspecified protein-calorie malnutrition: Secondary | ICD-10-CM | POA: Diagnosis present

## 2022-07-26 DIAGNOSIS — R7989 Other specified abnormal findings of blood chemistry: Secondary | ICD-10-CM

## 2022-07-26 DIAGNOSIS — Z79899 Other long term (current) drug therapy: Secondary | ICD-10-CM | POA: Diagnosis not present

## 2022-07-26 DIAGNOSIS — E876 Hypokalemia: Secondary | ICD-10-CM | POA: Diagnosis present

## 2022-07-26 DIAGNOSIS — J189 Pneumonia, unspecified organism: Secondary | ICD-10-CM

## 2022-07-26 DIAGNOSIS — C7802 Secondary malignant neoplasm of left lung: Secondary | ICD-10-CM | POA: Diagnosis present

## 2022-07-26 DIAGNOSIS — J910 Malignant pleural effusion: Secondary | ICD-10-CM | POA: Diagnosis present

## 2022-07-26 DIAGNOSIS — E871 Hypo-osmolality and hyponatremia: Secondary | ICD-10-CM | POA: Diagnosis present

## 2022-07-26 DIAGNOSIS — Z87891 Personal history of nicotine dependence: Secondary | ICD-10-CM | POA: Diagnosis not present

## 2022-07-26 DIAGNOSIS — J9601 Acute respiratory failure with hypoxia: Secondary | ICD-10-CM | POA: Diagnosis present

## 2022-07-26 DIAGNOSIS — E782 Mixed hyperlipidemia: Secondary | ICD-10-CM

## 2022-07-26 DIAGNOSIS — C801 Malignant (primary) neoplasm, unspecified: Secondary | ICD-10-CM | POA: Diagnosis not present

## 2022-07-26 DIAGNOSIS — C782 Secondary malignant neoplasm of pleura: Secondary | ICD-10-CM | POA: Diagnosis present

## 2022-07-26 DIAGNOSIS — E860 Dehydration: Secondary | ICD-10-CM | POA: Diagnosis present

## 2022-07-26 DIAGNOSIS — C7801 Secondary malignant neoplasm of right lung: Secondary | ICD-10-CM | POA: Diagnosis present

## 2022-07-26 DIAGNOSIS — E8809 Other disorders of plasma-protein metabolism, not elsewhere classified: Secondary | ICD-10-CM

## 2022-07-26 DIAGNOSIS — C7989 Secondary malignant neoplasm of other specified sites: Secondary | ICD-10-CM | POA: Diagnosis present

## 2022-07-26 DIAGNOSIS — G893 Neoplasm related pain (acute) (chronic): Secondary | ICD-10-CM | POA: Diagnosis present

## 2022-07-26 DIAGNOSIS — I129 Hypertensive chronic kidney disease with stage 1 through stage 4 chronic kidney disease, or unspecified chronic kidney disease: Secondary | ICD-10-CM | POA: Diagnosis present

## 2022-07-26 DIAGNOSIS — E278 Other specified disorders of adrenal gland: Secondary | ICD-10-CM | POA: Diagnosis present

## 2022-07-26 DIAGNOSIS — R9431 Abnormal electrocardiogram [ECG] [EKG]: Secondary | ICD-10-CM | POA: Diagnosis present

## 2022-07-26 LAB — GRAM STAIN

## 2022-07-26 LAB — LACTATE DEHYDROGENASE, PLEURAL OR PERITONEAL FLUID: LD, Fluid: 715 U/L — ABNORMAL HIGH (ref 3–23)

## 2022-07-26 LAB — URINALYSIS, ROUTINE W REFLEX MICROSCOPIC
Bilirubin Urine: NEGATIVE
Glucose, UA: NEGATIVE mg/dL
Hgb urine dipstick: NEGATIVE
Ketones, ur: 20 mg/dL — AB
Leukocytes,Ua: NEGATIVE
Nitrite: NEGATIVE
Protein, ur: NEGATIVE mg/dL
Specific Gravity, Urine: >1.046 — ABNORMAL HIGH (ref 1.005–1.030)
pH: 6 (ref 5.0–8.0)

## 2022-07-26 LAB — BODY FLUID CELL COUNT WITH DIFFERENTIAL
Eos, Fluid: 3 %
Lymphs, Fluid: 56 %
Monocyte-Macrophage-Serous Fluid: 6 % — ABNORMAL LOW (ref 50–90)
Neutrophil Count, Fluid: 35 % — ABNORMAL HIGH (ref 0–25)
Total Nucleated Cell Count, Fluid: 231 cu mm (ref 0–1000)

## 2022-07-26 LAB — TROPONIN I (HIGH SENSITIVITY)
Troponin I (High Sensitivity): 20 ng/L — ABNORMAL HIGH (ref <18)
Troponin I (High Sensitivity): 30 ng/L — ABNORMAL HIGH (ref <18)
Troponin I (High Sensitivity): 32 ng/L — ABNORMAL HIGH (ref <18)
Troponin I (High Sensitivity): 33 ng/L — ABNORMAL HIGH (ref <18)

## 2022-07-26 LAB — PROTEIN, PLEURAL OR PERITONEAL FLUID: Total protein, fluid: <3 g/dL

## 2022-07-26 LAB — PROCALCITONIN: Procalcitonin: 0.72 ng/mL

## 2022-07-26 LAB — LACTIC ACID, PLASMA
Lactic Acid, Venous: 2.2 mmol/L (ref 0.5–1.9)
Lactic Acid, Venous: 1.9 mmol/L (ref 0.5–1.9)
Lactic Acid, Venous: 2 mmol/L (ref 0.5–1.9)

## 2022-07-26 LAB — SARS CORONAVIRUS 2 BY RT PCR: SARS Coronavirus 2 by RT PCR: NEGATIVE

## 2022-07-26 MED ORDER — OXYCODONE HCL ER 10 MG PO T12A
10.0000 mg | EXTENDED_RELEASE_TABLET | Freq: Two times a day (BID) | ORAL | Status: DC
  Administered 2022-07-26 – 2022-07-27 (×3): 10 mg via ORAL
  Filled 2022-07-26 (×3): qty 1

## 2022-07-26 MED ORDER — GUAIFENESIN ER 600 MG PO TB12
600.0000 mg | ORAL_TABLET | Freq: Two times a day (BID) | ORAL | Status: DC
  Administered 2022-07-26 – 2022-07-27 (×3): 600 mg via ORAL
  Filled 2022-07-26 (×3): qty 1

## 2022-07-26 MED ORDER — ACETAMINOPHEN 325 MG PO TABS: 650.0000 mg | ORAL_TABLET | Freq: Four times a day (QID) | ORAL | Status: DC | PRN

## 2022-07-26 MED ORDER — BENZONATATE 100 MG PO CAPS
100.0000 mg | ORAL_CAPSULE | Freq: Three times a day (TID) | ORAL | Status: DC
  Administered 2022-07-26 – 2022-07-27 (×4): 100 mg via ORAL
  Filled 2022-07-26 (×4): qty 1

## 2022-07-26 MED ORDER — PROCHLORPERAZINE EDISYLATE 10 MG/2ML IJ SOLN: 10.0000 mg | Freq: Four times a day (QID) | INTRAMUSCULAR | Status: DC | PRN

## 2022-07-26 MED ORDER — ONDANSETRON HCL 4 MG/2ML IJ SOLN
4.0000 mg | Freq: Once | INTRAMUSCULAR | Status: AC
  Administered 2022-07-26: 4 mg via INTRAVENOUS
  Filled 2022-07-26: qty 2

## 2022-07-26 MED ORDER — HYDROCODONE BIT-HOMATROP MBR 5-1.5 MG/5ML PO SOLN
5.0000 mL | Freq: Four times a day (QID) | ORAL | Status: DC | PRN
  Administered 2022-07-27 (×2): 5 mL via ORAL
  Filled 2022-07-26 (×2): qty 5

## 2022-07-26 MED ORDER — SENNOSIDES-DOCUSATE SODIUM 8.6-50 MG PO TABS
1.0000 | ORAL_TABLET | Freq: Two times a day (BID) | ORAL | Status: DC
  Administered 2022-07-26 – 2022-07-27 (×3): 1 via ORAL
  Filled 2022-07-26 (×3): qty 1

## 2022-07-26 MED ORDER — ACETAMINOPHEN 500 MG PO TABS
1000.0000 mg | ORAL_TABLET | Freq: Once | ORAL | Status: AC
  Administered 2022-07-26: 1000 mg via ORAL
  Filled 2022-07-26: qty 2

## 2022-07-26 MED ORDER — ZOLPIDEM TARTRATE 5 MG PO TABS
5.0000 mg | ORAL_TABLET | Freq: Every evening | ORAL | Status: DC | PRN
  Administered 2022-07-26: 5 mg via ORAL
  Filled 2022-07-26: qty 1

## 2022-07-26 MED ORDER — OXYCODONE HCL 5 MG PO TABS
5.0000 mg | ORAL_TABLET | Freq: Four times a day (QID) | ORAL | Status: DC | PRN
  Administered 2022-07-26 – 2022-07-27 (×2): 5 mg via ORAL
  Filled 2022-07-26 (×2): qty 1

## 2022-07-26 MED ORDER — LIDOCAINE VISCOUS HCL 2 % MT SOLN: 15.0000 mL | OROMUCOSAL | Status: DC | PRN

## 2022-07-26 MED ORDER — LIDOCAINE HCL (PF) 2 % IJ SOLN
INTRAMUSCULAR | Status: AC
  Administered 2022-07-26: 10 mL
  Filled 2022-07-26: qty 10

## 2022-07-26 MED ORDER — DM-GUAIFENESIN ER 30-600 MG PO TB12
1.0000 | ORAL_TABLET | Freq: Two times a day (BID) | ORAL | Status: DC
  Filled 2022-07-26: qty 1

## 2022-07-26 MED ORDER — MORPHINE SULFATE ER 30 MG PO TBCR: 30.0000 mg | EXTENDED_RELEASE_TABLET | Freq: Two times a day (BID) | ORAL | Status: DC

## 2022-07-26 MED ORDER — SODIUM CHLORIDE 0.9 % IV SOLN
500.0000 mg | INTRAVENOUS | Status: DC
  Administered 2022-07-26: 500 mg via INTRAVENOUS
  Filled 2022-07-26: qty 5

## 2022-07-26 MED ORDER — POLYETHYLENE GLYCOL 3350 17 G PO PACK: 17.0000 g | PACK | Freq: Every day | ORAL | Status: DC | PRN

## 2022-07-26 MED ORDER — LACTATED RINGERS IV SOLN: INTRAVENOUS | Status: DC

## 2022-07-26 MED ORDER — ROSUVASTATIN CALCIUM 20 MG PO TABS
10.0000 mg | ORAL_TABLET | Freq: Every day | ORAL | Status: DC
  Administered 2022-07-26 – 2022-07-27 (×2): 10 mg via ORAL
  Filled 2022-07-26 (×2): qty 1

## 2022-07-26 MED ORDER — SODIUM CHLORIDE 0.9 % IV SOLN
100.0000 mg | Freq: Two times a day (BID) | INTRAVENOUS | Status: DC
  Administered 2022-07-26 – 2022-07-27 (×2): 100 mg via INTRAVENOUS
  Filled 2022-07-26 (×4): qty 100

## 2022-07-26 MED ORDER — ACETAMINOPHEN 650 MG RE SUPP: 650.0000 mg | Freq: Four times a day (QID) | RECTAL | Status: DC | PRN

## 2022-07-26 MED ORDER — ENSURE ENLIVE PO LIQD
237.0000 mL | Freq: Two times a day (BID) | ORAL | Status: DC
  Administered 2022-07-26 – 2022-07-27 (×3): 237 mL via ORAL
  Filled 2022-07-26 (×6): qty 237

## 2022-07-26 MED ORDER — IOHEXOL 350 MG/ML SOLN
100.0000 mL | Freq: Once | INTRAVENOUS | Status: AC | PRN
  Administered 2022-07-26: 100 mL via INTRAVENOUS

## 2022-07-26 MED ORDER — SODIUM CHLORIDE 0.9 % IV SOLN
2.0000 g | INTRAVENOUS | Status: DC
  Administered 2022-07-26 – 2022-07-27 (×2): 2 g via INTRAVENOUS
  Filled 2022-07-26 (×2): qty 20

## 2022-07-26 MED ORDER — HYDROMORPHONE HCL 1 MG/ML IJ SOLN
0.5000 mg | INTRAMUSCULAR | Status: DC | PRN
  Administered 2022-07-26 – 2022-07-27 (×6): 0.5 mg via INTRAVENOUS
  Filled 2022-07-26 (×7): qty 0.5

## 2022-07-26 MED ORDER — LACTATED RINGERS IV BOLUS
1000.0000 mL | Freq: Once | INTRAVENOUS | Status: AC
  Administered 2022-07-26: 1000 mL via INTRAVENOUS

## 2022-07-26 MED ORDER — DEXTROMETHORPHAN POLISTIREX ER 30 MG/5ML PO SUER
30.0000 mg | Freq: Two times a day (BID) | ORAL | Status: DC
  Administered 2022-07-26 – 2022-07-27 (×3): 30 mg via ORAL
  Filled 2022-07-26 (×3): qty 5

## 2022-07-26 NOTE — H&P (Signed)
History and Physical    Patient: [name] [name] [id number]:[id number] DOB: 1956-08-10 DOA: 07/25/2022 DOS: the patient was seen and examined on 07/26/2022 PCP: [name], [place]  Patient coming from: Home  Chief Complaint:  Chief Complaint  Patient presents with   Shortness of Breath   HPI: [name] [name] is a 66 y.o. male with medical history significant of squamosal carcinoma, pleural effusion, hypertension, insomnia who presents to the emergency department due to 1 week onset of progressive shortness of breath.  Patient states that he had similar symptoms several weeks ago and about 2 to 3 weeks ago he had thoracentesis at [facility] during which 3 L of fluid was drained.  He felt better, but about a week ago, he started to have increased shortness of breath on exertion and associated cough which is nonproductive.  He also complained of chronic pain and takes Dilaudid, but ran out of the medication today, unfortunately, he was unable to refill this due to [place] law.  He denies nausea, vomiting, diarrhea or constipation.  ED Course:  In the emergency department, he was febrile, tachycardic, BP was 91/67 and patient was requiring supplemental oxygen at 6 LPM to maintain O2 sat of 96% (patient does not use oxygen at baseline).  Work-up in the ED showed normal CBC except for WBC of 11.9.  BMP showed sodium 123, potassium 3.4, chloride 92, bicarb 28, glucose 132, BUN 9, creatinine 0.56, calcium 11.9, albumin 2.6.  BNP 45.0, lactic acid 3.0 > 2.2, troponin x 2 - 20 > 30.  Blood culture pending. CT angiography chest with contrast showed: 1. No definite evidence of pulmonary embolism. Evaluation of the segmental and subsegmental arteries is limited due to pulmonary nodules and infiltrates. 2. Marked interval worsening of pulmonary nodules and parenchymal opacities bilaterally, compatible with known metastatic disease. Pleural thickening is also noted on the right with nodules extending into the mediastinal  fat on the right, likely pleural metastasis. 3. Small left pleural effusion and moderate loculated right pleural effusion. 4. Compressive atelectasis versus infiltrate at the right lung base. 5. Bilateral adrenal nodules, new from the previous exam and suspicious for metastatic disease. 6. Subcutaneous nodules in the anterior chest wall on the right, suspicious for metastatic disease. 7. Aortic atherosclerosis. Chest x-ray showed: 1. Scattered nodular opacities bilaterally, compatible with known pulmonary nodular metastasis. 2. Patchy airspace opacities bilaterally with atelectasis or infiltrate at the right lung base. 3. Small to moderate right pleural effusion. Patient was treated with Tylenol, Dilaudid and Zofran were given.  IV hydration was provided.  Hospitalist was asked admit patient for further evaluation and management.  Review of Systems: Review of systems as noted in the HPI. All other systems reviewed and are negative.   Past Medical History:  Diagnosis Date   Acute kidney failure ([place]) 04/2018   Cancer ([place])    CKD (chronic kidney disease), stage II    Complication of anesthesia    DDD (degenerative disc disease), lumbar    History of pancreatitis    20-30 years ago   History of venomous snake bite    rt index finger-   Hypercholesterolemia    Hypertension    Nerve pain    Right leg, resolved with stimulator   PONV (postoperative nausea and vomiting)    S/P insertion of spinal cord stimulator 08/2017   Spinal stenosis    Tobacco abuse    Past Surgical History:  Procedure Laterality Date   COLONOSCOPY     FINGER EXPLORATION  05/2011   rt index finger [id number]   HERNIA REPAIR     as a child   I & D EXTREMITY  11/24/2011   Procedure: IRRIGATION AND DEBRIDEMENT EXTREMITY;  Surgeon: Tennis Must, MD;  Location: [place];  Service: Orthopedics;  Laterality: Right;  right index   IR IMAGING GUIDED PORT INSERTION  11/17/2021   IR US GUIDE VASC  ACCESS RIGHT  11/17/2021   MASS EXCISION  01/23/2012   Procedure: EXCISION MASS;  Surgeon: Tennis Must, MD;  Location: [place];  Service: Orthopedics;  Laterality: Right;  Right index a-cell graft   TOTAL HIP ARTHROPLASTY Right 05/03/2018   Procedure: RIGHT TOTAL HIP ARTHROPLASTY ANTERIOR APPROACH;  Surgeon: Rod Can, MD;  Location: [facility];  Service: Orthopedics;  Laterality: Right;  Needs RNFA    Social History:  reports that he has quit smoking. His smoking use included cigarettes. He has a 10.00 pack-year smoking history. He has never used smokeless tobacco. He reports current alcohol use. He reports that he does not use drugs.   Allergies  Allergen Reactions   Codeine Other (See Comments)    "Gives me a headache for days."    Family History  Problem Relation Age of Onset   Diabetes Mother    Throat cancer Brother      Prior to Admission medications   Medication Sig Start Date End Date Taking? Authorizing Provider  lidocaine (XYLOCAINE) 2 % solution Patient: Mix 1part 2% viscous lidocaine, 1part H20. Swish & swallow 34mL of diluted mixture, 63min before meals and at bedtime, up to QID 04/26/22   Pickenpack-Cousar, Carlena Sax, NP  Multiple Vitamins-Minerals (CENTRUM ULTRA MENS) TABS Take 1 tablet by mouth daily.      [provider]  olmesartan-hydrochlorothiazide (BENICAR HCT) 40-25 MG per tablet Take 1 tablet by mouth daily.      [provider]  rosuvastatin (CRESTOR) 10 MG tablet Take 1 tablet (10 mg total) by mouth daily. 11/24/21   Benay Pike, MD  traZODone (DESYREL) 50 MG tablet Take 1 tablet (50 mg total) by mouth at bedtime as needed for sleep. 04/12/22   [name], FNP  zolpidem (AMBIEN) 5 MG tablet Take 1 tablet (5 mg total) by mouth at bedtime as needed for sleep. 04/13/22   [name], FNP    Physical Exam: BP 92/66   Pulse 98   Temp (!) 97.5 F (36.4 C) (Oral)   Resp 20   Ht $R'5\' 8"'Fb$  (1.727 m)   Wt 66.7 kg   SpO2  96%   BMI 22.36 kg/m   General: 66 y.o. year-old male well developed well nourished in no acute distress.  Alert and oriented x3. HEENT: NCAT, EOMI Neck: Supple, trachea medial Cardiovascular: Tachycardia.  Regular rate and rhythm with no rubs or gallops.  No thyromegaly or JVD noted.  No lower extremity edema. 2/4 pulses in all 4 extremities. Respiratory: Bilateral decreased breath sounds with increased rales (R  > L ) Abdomen: Soft, nontender nondistended with normal bowel sounds x4 quadrants. Muskuloskeletal: No cyanosis, clubbing or edema noted bilaterally Neuro: CN II-XII intact, strength 5/5 x 4, sensation, reflexes intact Skin: No ulcerative lesions noted or rashes Psychiatry: Judgement and insight appear normal. Mood is appropriate for condition and setting          Labs on Admission:  Basic Metabolic Panel: Recent Labs  Lab 07/25/22 2300  NA 133*  K 3.5  CL 92*  CO2 28  GLUCOSE  132*  BUN 9  CREATININE 0.56*  CALCIUM 11.9*   Liver Function Tests: Recent Labs  Lab 07/25/22 2300  AST 32  ALT 13  ALKPHOS 74  BILITOT 0.9  PROT 5.8*  ALBUMIN 2.6*   No results for input(s): "LIPASE", "AMYLASE" in the last 168 hours. No results for input(s): "AMMONIA" in the last 168 hours. CBC: Recent Labs  Lab 07/25/22 2300  WBC 11.9*  NEUTROABS 10.8*  HGB 13.7  HCT 41.0  MCV 96.2  PLT 181   Cardiac Enzymes: No results for input(s): "CKTOTAL", "CKMB", "CKMBINDEX", "TROPONINI" in the last 168 hours.  BNP (last 3 results) Recent Labs    07/25/22 2301  BNP 45.0    ProBNP (last 3 results) No results for input(s): "PROBNP" in the last 8760 hours.  CBG: No results for input(s): "GLUCAP" in the last 168 hours.  Radiological Exams on Admission: CT Angio Chest PE W and/or Wo Contrast  Result Date: 07/26/2022 CLINICAL DATA:  Pulmonary embolism suspected, high probability. Shortness of breath. EXAM: CT ANGIOGRAPHY CHEST WITH CONTRAST TECHNIQUE: Multidetector CT  imaging of the chest was performed using the standard protocol during bolus administration of intravenous contrast. Multiplanar CT image reconstructions and MIPs were obtained to evaluate the vascular anatomy. RADIATION DOSE REDUCTION: This exam was performed according to the departmental dose-optimization program which includes automated exposure control, adjustment of the mA and/or kV according to patient size and/or use of iterative reconstruction technique. CONTRAST:  152mL OMNIPAQUE IOHEXOL 350 MG/ML SOLN COMPARISON:  04/14/2022. FINDINGS: Cardiovascular: Heart is normal in size and there is no pericardial effusion. There is mild atherosclerotic calcification of the aorta without evidence of aneurysm. The pulmonary trunk is normal in caliber. No definite evidence of pulmonary embolism. Evaluation of the segmental and subsegmental arteries is limited due to extensive nodules and infiltrates. Mediastinum/Nodes: Enlarged lymph nodes are present in the mediastinum measuring up to 1.1 cm in the right paratracheal space. Hilar lymphadenopathy is noted bilaterally. No axillary lymphadenopathy. The thyroid gland, trachea, and esophagus are within normal limits. Lungs/Pleura: Diffuse scattered pulmonary nodules and parenchymal opacities are present in the lungs, increased in size and number from the prior exam. There is a small pleural effusion on the left and a moderate loculated pleural effusion on the right with right basilar compressive atelectasis or infiltrate. Pleural thickening is noted on the right with a nodular opacity in the anterior aspect of the right upper lobe extending into the mediastinal fat measuring 4.7 x 1.8 cm, axial image 42. No pneumothorax. Upper Abdomen: Adrenal nodules are present bilaterally and new from the previous exam. Musculoskeletal: A subareolar nodule is noted in the right breast measuring 3.1 cm. There is a nodule in the anterior chest wall on the right measuring 1 cm, axial image  26. Neurostimulator leads terminate in the thoracic spinal canal. No acute or suspicious osseous abnormality. Review of the MIP images confirms the above findings. IMPRESSION: 1. No definite evidence of pulmonary embolism. Evaluation of the segmental and subsegmental arteries is limited due to pulmonary nodules and infiltrates. 2. Marked interval worsening of pulmonary nodules and parenchymal opacities bilaterally, compatible with known metastatic disease. Pleural thickening is also noted on the right with nodules extending into the mediastinal fat on the right, likely pleural metastasis. 3. Small left pleural effusion and moderate loculated right pleural effusion. 4. Compressive atelectasis versus infiltrate at the right lung base. 5. Bilateral adrenal nodules, new from the previous exam and suspicious for metastatic disease. 6. Subcutaneous nodules in the  anterior chest wall on the right, suspicious for metastatic disease. 7. Aortic atherosclerosis. Electronically Signed   By: Brett Fairy M.D.   On: 07/26/2022 01:13   DG Chest Port 1 View  Result Date: 07/25/2022 CLINICAL DATA:  Questionable sepsis, shortness of breath. EXAM: PORTABLE CHEST 1 VIEW COMPARISON:  09/03/2008, 04/14/2022. FINDINGS: The heart size and mediastinal contours are within normal limits. There is atherosclerotic calcification of the aorta. Nodular and patchy airspace opacities are present in the lungs bilaterally. There is a small to moderate right pleural effusion with patchy atelectasis or infiltrate at the right lung base. No pneumothorax. No acute osseous abnormality. IMPRESSION: 1. Scattered nodular opacities bilaterally, compatible with known pulmonary nodular metastasis. 2. Patchy airspace opacities bilaterally with atelectasis or infiltrate at the right lung base. 3. Small to moderate right pleural effusion. Electronically Signed   By: Brett Fairy M.D.   On: 07/25/2022 23:08    EKG: I independently viewed the EKG done and my  findings are as followed: Sinus tachycardia at a rate of (2 bpm with QTc of 505 ms  Assessment/Plan Present on Admission:  CAP (community acquired pneumonia)  Principal Problem:   CAP (community acquired pneumonia) Active Problems:   Mixed hyperlipidemia   Cancer ([place])   Sepsis ([place])   Lactic acidosis   Hyponatremia   Hypercalcemia of malignancy   Prolonged QT interval   Acute respiratory failure with hypoxia (HCC)   Hypoalbuminemia due to protein-calorie malnutrition (HCC)   Elevated troponin  Sepsis secondary to CAP POA Patient met sepsis criteria due to being febrile and tachycardic with source of infection being the lung Patient was started on ceftriaxone and azithromycin, we shall continue same at this time with plan to de-escalate/discontinue based on blood culture, sputum culture, urine Legionella, strep pneumo and procalcitonin Continue Tylenol as needed Continue Mucinex, incentive spirometry, flutter valve   Lactic acidosis possibly secondary to above Lactic acid 3.0 > 2.2 Continue to trend lactic acid  Bilateral pleural effusion CT angiography chest showed small left pleural effusion and moderate loculated right pleural effusion. IR will be consulted for thoracentesis  Acute respiratory failure with hypoxia possibly due to pleural effusion Continue supplemental oxygen to maintain O2 sat > 92% and wean patient off oxygen as tolerated (patient does not use oxygen at baseline).  Pulmonary nodules due to metastatic lung disease Squamous cell carcinoma of the lung CT angiography of chest showed marked interval worsening of pulmonary nodules and parenchymal opacities bilaterally, compatible with known metastatic disease Patient will continue outpatient follow-up with his oncologist  Hypercalcemia of malignancy Corrected calcium based on albumin of 2.6 = 12.3 Continue IV hydration  Hypoalbuminemia possibly secondary to moderate protein calorie malnutrition Albumin  2.6, protein supplement to be provided  Prolonged QT interval QTc 505 ms Avoid QT prolonging drugs Magnesium level will be checked Repeat EKG in the morning  Hyponatremia Na 133, continue IV hydration  Elevated troponin possibly secondary to type II demand ischemia Troponin 20 > 30, patient denies chest pain at this time.  Continue to trend troponin  Essential hypertension BP meds will be held at this time due to soft BP  Mixed hyperlipidemia Continue Crestor  DVT prophylaxis: SCDs  Code Status: Full code  Consults: IR  Family Communication: None at bedside  Severity of Illness: The appropriate patient status for this patient is INPATIENT. Inpatient status is judged to be reasonable and necessary in order to provide the required intensity of service to ensure the patient's safety. The patient's  presenting symptoms, physical exam findings, and initial radiographic and laboratory data in the context of their chronic comorbidities is felt to place them at high risk for further clinical deterioration. Furthermore, it is not anticipated that the patient will be medically stable for discharge from the hospital within 2 midnights of admission.   * I certify that at the point of admission it is my clinical judgment that the patient will require inpatient hospital care spanning beyond 2 midnights from the point of admission due to high intensity of service, high risk for further deterioration and high frequency of surveillance required.*  Author: Bernadette Hoit, DO 07/26/2022 5:46 AM  For on call review [email or web address].

## 2022-07-26 NOTE — Procedures (Signed)
PreOperative Dx: RIGHT pleural effusion Postoperative Dx: Complex multiloculated RIGHT pleural effusion Procedure:   US guided RIGHT thoracentesis Radiologist:  Thornton Papas Anesthesia:  10 ml of 1% lidocaine Specimen:  500 mL of serosanguinous colored fluid EBL:   < 1 ml Complications:  None

## 2022-07-26 NOTE — Progress Notes (Signed)
   07/26/22 2133  Assess: MEWS Score  Temp 98.3 F (36.8 C)  BP 92/65  MAP (mmHg) 74  Pulse Rate (!) 105  Resp 20  SpO2 94 %  O2 Device Room Air  Assess: MEWS Score  MEWS Temp 0  MEWS Systolic 1  MEWS Pulse 1  MEWS RR 0  MEWS LOC 0  MEWS Score 2  MEWS Score Color Yellow  Assess: if the MEWS score is Yellow or Red  Were vital signs taken at a resting state? Yes  Focused Assessment No change from prior assessment  Does the patient meet 2 or more of the SIRS criteria? Yes  Does the patient have a confirmed or suspected source of infection? Yes  Provider and Rapid Response Notified? No  MEWS guidelines implemented *See Row Information* No, vital signs rechecked  Treat  MEWS Interventions Administered scheduled meds/treatments;Administered prn meds/treatments  Pain Scale 0-10  Pain Score 7  Pain Type Acute pain  Pain Location Back  Pain Orientation Lower  Pain Descriptors / Indicators Sharp  Pain Frequency Constant  Pain Onset On-going  Patients Stated Pain Goal 2  Pain Intervention(s) Medication (See eMAR)  Document  Patient Outcome Stabilized after interventions  Assess: SIRS CRITERIA  SIRS Temperature  0  SIRS Pulse 1  SIRS Respirations  0  SIRS WBC 1  SIRS Score Sum  2   Pt given pain medication for back pain, partial effects noted. HR and BP within limits with recheck within hour. Pt asymptomatic, no distress noted. No changes in assessment. Will continue to monitor.

## 2022-07-26 NOTE — Progress Notes (Signed)
  Transition of Care [facility]) Screening Note   Patient Details  Name: [name] [name] Date of Birth: 05-Jun-1956   [facility]) CM/SW Contact:    Iona Beard, [place] Phone Number: 07/26/2022, 12:39 PM    Transition of Care Department [facility]) has reviewed patient and no TOC needs have been identified at this time. We will continue to monitor patient advancement through interdisciplinary progression rounds. If new patient transition needs arise, please place a TOC consult.

## 2022-07-26 NOTE — Progress Notes (Signed)
[facility] PLAN OF CARE NOTE Patient: [name] [name] ZJQ:[id number]   PCP: [name], FNP DOB: [birth year]   DOA: 07/25/2022   DOS: 07/26/2022    Patient was admitted by my colleague earlier on 07/26/2022. I have reviewed the H&P as well as assessment and plan and agree with the same. Important changes in the plan are listed below.  Plan of care: Principal Problem:   CAP (community acquired pneumonia) Active Problems:   Mixed hyperlipidemia   Sepsis due to pneumonia ([place])   Lactic acidosis   Hyponatremia   Hypercalcemia of malignancy   Prolonged QT interval   Acute respiratory failure with hypoxia (HCC)   Hypoalbuminemia due to protein-calorie malnutrition (HCC)   Elevated troponin   Metastatic squamous cell carcinoma to head and neck (HCC)   Squamous cell carcinoma of skin of chest on right Continue with IV antibiotics for pneumonia.  Sepsis physiology already improving. Patient has cancer related pain and takes MS Contin 30 mg twice daily. Plan also switching to OxyContin 10 mg twice daily which I will initiate in the hospital. Supplement that with as needed medications as well. Oxygenation already improved after thoracentesis. Continue with gentle IV hydration for now. Patient was informed that his recent chest nodule biopsy was positive for squamous cell carcinoma Per care everywhere and he should contact his primary oncologist as soon as possible at the time of the discharge for further discussion and management of the condition. Also discussed in detail, patient does not want daughter to directly receive information from the staff. Continue aggressive cough regimen. Initiate bowel regimen.  Level of care: Telemetry Telemetry reviewed, shows sinus tachycardia and Continue Telemetry due to presentation with sepsis  Author: Berle Mull, MD [facility] 07/26/2022 6:06 PM   If 7PM-7AM, please contact night-coverage at [email or web address]

## 2022-07-26 NOTE — ED Notes (Signed)
Patient to IR for procedure

## 2022-07-26 NOTE — Sepsis Progress Note (Signed)
Following per sepsis protocol   

## 2022-07-26 NOTE — Progress Notes (Signed)
Pt tolerated right sided thoracentesis procedure well today and 500 mL of serosanguinous fluid removed and sent to lab at this time for processing. PT verbalized understanding of post procedure instructions, taken to chest xray post procedure then transported back to ED bed assignment with on acute distress noted.

## 2022-07-27 DIAGNOSIS — E871 Hypo-osmolality and hyponatremia: Secondary | ICD-10-CM | POA: Diagnosis not present

## 2022-07-27 DIAGNOSIS — C7989 Secondary malignant neoplasm of other specified sites: Secondary | ICD-10-CM

## 2022-07-27 DIAGNOSIS — J189 Pneumonia, unspecified organism: Secondary | ICD-10-CM | POA: Diagnosis not present

## 2022-07-27 DIAGNOSIS — J9601 Acute respiratory failure with hypoxia: Secondary | ICD-10-CM | POA: Diagnosis not present

## 2022-07-27 LAB — CBC
HCT: 38.9 % — ABNORMAL LOW (ref 39.0–52.0)
Hemoglobin: 12.9 g/dL — ABNORMAL LOW (ref 13.0–17.0)
MCH: 32.4 pg (ref 26.0–34.0)
MCHC: 33.2 g/dL (ref 30.0–36.0)
MCV: 97.7 fL (ref 80.0–100.0)
Platelets: 153 10*3/uL (ref 150–400)
RBC: 3.98 MIL/uL — ABNORMAL LOW (ref 4.22–5.81)
RDW: 12.4 % (ref 11.5–15.5)
WBC: 9.7 10*3/uL (ref 4.0–10.5)
nRBC: 0 % (ref 0.0–0.2)

## 2022-07-27 LAB — COMPREHENSIVE METABOLIC PANEL
ALT: 18 U/L (ref 0–44)
AST: 37 U/L (ref 15–41)
Albumin: 2.2 g/dL — ABNORMAL LOW (ref 3.5–5.0)
Alkaline Phosphatase: 62 U/L (ref 38–126)
Anion gap: 9 (ref 5–15)
BUN: 9 mg/dL (ref 8–23)
CO2: 28 mmol/L (ref 22–32)
Calcium: 11.4 mg/dL — ABNORMAL HIGH (ref 8.9–10.3)
Chloride: 98 mmol/L (ref 98–111)
Creatinine, Ser: 0.5 mg/dL — ABNORMAL LOW (ref 0.61–1.24)
GFR, Estimated: >60 mL/min (ref >60)
Glucose, Bld: 104 mg/dL — ABNORMAL HIGH (ref 70–99)
Potassium: 3.3 mmol/L — ABNORMAL LOW (ref 3.5–5.1)
Sodium: 135 mmol/L (ref 135–145)
Total Bilirubin: 0.5 mg/dL (ref 0.3–1.2)
Total Protein: 4.8 g/dL — ABNORMAL LOW (ref 6.5–8.1)

## 2022-07-27 LAB — LEGIONELLA PNEUMOPHILA SEROGP 1 UR AG: L. pneumophila Serogp 1 Ur Ag: NEGATIVE

## 2022-07-27 LAB — URINE CULTURE

## 2022-07-27 LAB — STREP PNEUMONIAE URINARY ANTIGEN: Strep Pneumo Urinary Antigen: NEGATIVE

## 2022-07-27 LAB — CYTOLOGY - NON PAP

## 2022-07-27 MED ORDER — SODIUM CHLORIDE 0.9 % IV BOLUS
1000.0000 mL | Freq: Once | INTRAVENOUS | Status: AC
  Administered 2022-07-27: 1000 mL via INTRAVENOUS

## 2022-07-27 MED ORDER — GUAIFENESIN ER 600 MG PO TB12: 600.0000 mg | ORAL_TABLET | Freq: Two times a day (BID) | ORAL | Status: AC

## 2022-07-27 MED ORDER — AMOXICILLIN-POT CLAVULANATE 875-125 MG PO TABS: 1.0000 | ORAL_TABLET | Freq: Two times a day (BID) | ORAL | 0 refills | Status: AC

## 2022-07-27 MED ORDER — ZOLEDRONIC ACID 4 MG/5ML IV CONC: 4.0000 mg | Freq: Once | INTRAVENOUS | Status: DC

## 2022-07-27 MED ORDER — ZOLEDRONIC ACID 4 MG/100ML IV SOLN
4.0000 mg | Freq: Once | INTRAVENOUS | Status: AC
  Administered 2022-07-27: 4 mg via INTRAVENOUS
  Filled 2022-07-27: qty 100

## 2022-07-27 NOTE — Progress Notes (Addendum)
Pt has discharge orders, discharge teaching given and no further questions at this time. Pt wheeled down to main entrance via staff by w/c.

## 2022-07-27 NOTE — Progress Notes (Signed)
Slept fairly well, prn pain medications given x 3, partial effects noted. Up to bathroom with standby assist. O2 sats>92% on room air. No distress noted. Continues with frequent productive cough, given prn hycodan with partial effects noted.

## 2022-07-27 NOTE — Discharge Summary (Signed)
Discharge Summary  [name] [name] EXB:[id number] DOB: 1956/08/02  PCP: [name], FNP  Admit date: 07/25/2022 Discharge date: 07/27/2022  30 Day Unplanned Readmission Risk Score    Flowsheet Row ED to Hosp-Admission (Current) from 07/25/2022 in [facility]  30 Day Unplanned Readmission Risk Score (%) 21.14 Filed at 07/27/2022 1200       This score is the patient's risk of an unplanned readmission within 30 days of being discharged (0 -100%). The score is based on dignosis, age, lab data, medications, orders, and past utilization.   Low:  0-14.9   Medium: 15-21.9   High: 22-29.9   Extreme: 30 and above         Time spent: 37mns, more than 50% time spent on coordination of care.   Recommendations for Outpatient Follow-up:  F/u with [facility] oncology Dr MLollie Sailsnext week as scheduled, Dr MLollie Sailsis to follow up on calcium level    Discharge Diagnoses:  Active Hospital Problems   Diagnosis Date Noted   CAP (community acquired pneumonia) 07/26/2022   Sepsis due to pneumonia ([facility] 07/26/2022   Lactic acidosis 07/26/2022   Hyponatremia 07/26/2022   Hypercalcemia of malignancy 07/26/2022   Prolonged QT interval 07/26/2022   Acute respiratory failure with hypoxia ([facility] 07/26/2022   Hypoalbuminemia due to protein-calorie malnutrition ([facility] 07/26/2022   Elevated troponin 07/26/2022   Metastatic squamous cell carcinoma to head and neck ([facility] 07/26/2022   Squamous cell carcinoma of skin of chest on right 07/26/2022   Mixed hyperlipidemia 05/17/2011    Resolved Hospital Problems  No resolved problems to display.    Discharge Condition: stable  Diet recommendation: regular diet   Filed Weights   07/25/22 2215  Weight: 66.7 kg    History of present illness: ( per admitting MD Dr  AJosephine Cables HPI: [name] WEISSBERGis a 66y.o. male with medical history significant of squamosal carcinoma, pleural effusion, hypertension, insomnia who presents to the  emergency department due to 1 week onset of progressive shortness of breath.  Patient states that he had similar symptoms several weeks ago and about 2 to 3 weeks [facility].  He felt better, but about a week ago, he started to have increased shortness of breath on exertion and associated cough which is nonproductive.  He also complained of chronic pain and takes Dilaudid, but ran out of the medication today, unfortunately, he was unable to refill this due to [place] law.  He denies nausea, vomiting, diarrhea or constipation.   ED Course:  In the emergency department, he was febrile, tachycardic, BP was 91/67 and patient was requiring supplemental oxygen at 6 LPM to maintain O2 sat of 96% (patient does not use oxygen at baseline).  Work-up in the ED showed normal CBC except for WBC of 11.9.  BMP showed sodium 123, potassium 3.4, chloride 92, bicarb 28, glucose 132, BUN 9, creatinine 0.56, calcium 11.9, albumin 2.6.  BNP 45.0, lactic acid 3.0 > 2.2, troponin x 2 - 20 > 30.  Blood culture pending. CT angiography chest with contrast showed: 1. No definite evidence of pulmonary embolism. Evaluation of the segmental and subsegmental arteries is limited due to pulmonary nodules and infiltrates. 2. Marked interval worsening of pulmonary nodules and parenchymal opacities bilaterally, compatible with known metastatic disease. Pleural thickening is also noted on the right with nodules extending into the mediastinal fat on the right, likely pleural metastasis.  3. Small left pleural effusion and moderate loculated right pleural effusion. 4. Compressive atelectasis versus infiltrate at the right lung base. 5. Bilateral adrenal nodules, new from the previous exam and suspicious for metastatic disease. 6. Subcutaneous nodules in the anterior chest wall on the right, suspicious for metastatic disease. 7. Aortic atherosclerosis. Chest x-ray showed: 1.  Scattered nodular opacities bilaterally, compatible with known pulmonary nodular metastasis. 2. Patchy airspace opacities bilaterally with atelectasis or infiltrate at the right lung base. 3. Small to moderate right pleural effusion. Patient was treated with Tylenol, Dilaudid and Zofran were given.  IV hydration was provided.  Hospitalist was asked admit patient for further evaluation and management.    Hospital Course:  Principal Problem:   CAP (community acquired pneumonia) Active Problems:   Mixed hyperlipidemia   Sepsis due to pneumonia ([place])   Lactic acidosis   Hyponatremia   Hypercalcemia of malignancy   Prolonged QT interval   Acute respiratory failure with hypoxia (HCC)   Hypoalbuminemia due to protein-calorie malnutrition (HCC)   Elevated troponin   Metastatic squamous cell carcinoma to head and neck (HCC)   Squamous cell carcinoma of skin of chest on right    Sepsis with CAP /POA -SARS-CoV-2 screening negative -Blood culture negative -Pleural fluid culture negative -He received Rocephin and doxycycline in the hospital, sepsis physiology resolved (fever, tachycardia leukocytosis resolved, lactic acidosis improved), he is discharged on Augmentin to finish total 5 days of antibiotic treatment  Acute hypoxic respiratory failure -present with short of breath, tachypnea, when ems arrived pt was 90% on room air. patient was put on oxygen at 6 LPM to maintain O2 sat of 96% -resolved, no sob, he is on room air at discharge  H/o head neck cancer, with new lung mets and pleural mets, bilateral adrenal nodules.  Subcutaneous nodule on the anterior chest wall on the right Presumed recurrent malignant pleural effusion, s/p thoracentesis with 500 cc removed, though pleural fluid cytology negative for malignant cells  Hypercalcemia of malignancy Received hydration, case discussed with [facility] onc[facility] Dr Lollie Sails who agreed with [place] x1, patient can be discharged home close follow-up  with [facility] Patient desire to go home Encourage adequate hydration at home Strict return precaution provided  Cancer pain Continue home meds, follow-up with [facility]  Hypokalemia, K replaced  Mild hyponatremia, likely due to dehydration, sodium normalized after hydration   Procedures: Ultrasound-guided thoracentesis  Consultations: [facility] onc[facility] dr Lollie Sails over the phone  Discharge Exam: BP 92/67 (BP Location: Right Arm)   Pulse 98   Temp 97.7 F (36.5 C) (Oral)   Resp 20   Ht '5\' 8"'$  (1.727 m)   Wt 66.7 kg   SpO2 91%   BMI 22.36 kg/m   General: NAD, pleasant Cardiovascular: RRR Respiratory: Normal respiratory effort    Discharge Instructions     Diet general   Complete by: As directed    Increase activity slowly   Complete by: As directed       Allergies as of 07/27/2022   No Known Allergies      Medication List     TAKE these medications    Acetaminophen 500 MG capsule Take 500 mg by mouth every 6 (six) hours as needed for pain.   amoxicillin-clavulanate 875-125 MG tablet Commonly known as: AUGMENTIN Take 1 tablet by mouth 2 (two) times daily for 3 days.   Centrum Ultra Mens Tabs Take 1 tablet by mouth daily.   guaiFENesin 600 MG 12 hr tablet Commonly known as:  MUCINEX Take 1 tablet (600 mg total) by mouth 2 (two) times daily.   lidocaine 2 % solution Commonly known as: XYLOCAINE Patient: Mix 1part 2% viscous lidocaine, 1part H20. Swish & swallow 6m of diluted mixture, 390m before meals and at bedtime, up to QID   morphine 30 MG 12 hr tablet Commonly known as: MS CONTIN Take 30 mg by mouth 2 (two) times daily as needed for pain.   olmesartan-hydrochlorothiazide 40-25 MG tablet Commonly known as: BENICAR HCT Take 1 tablet by mouth daily.   polyethylene glycol powder 17 GM/SCOOP powder Commonly known as: GLYCOLAX/MIRALAX Take 1 Container by mouth daily as needed for mild constipation.   potassium chloride SA 20 MEQ  tablet Commonly known as: KLOR-CON M Take 20 mEq by mouth daily.   rosuvastatin 10 MG tablet Commonly known as: CRESTOR Take 1 tablet (10 mg total) by mouth daily.   senna-docusate 8.6-50 MG tablet Commonly known as: Senokot-S Take 1 tablet by mouth at bedtime as needed for mild constipation.   zolpidem 5 MG tablet Commonly known as: AMBIEN Take 1 tablet (5 mg total) by mouth at bedtime as needed for sleep.       No Known Allergies  Follow-up Information     Muzaffar, Jameel, MD Follow up.   Specialty: Oncology Why: next week as scheduled , Dr MuLollie Sailsill follow up on your calcium level, please return to the ED if you become weak, confused.  please stay hydrated Contact information: [address] [phone]                The results of significant diagnostics from this hospitalization (including imaging, microbiology, ancillary and laboratory) are listed below for reference.    Significant Diagnostic Studies: [place]ORACENTESIS ASP PLEURAL SPACE W/IMG GUIDE  Result Date: 07/26/2022 INDICATION: History of tonsillar cancer, abnormal CT demonstrating RIGHT pleural effusion, pleural nodularity, pulmonary nodules EXAM: ULTRASOUND GUIDED DIAGNOSTIC RIGHT THORACENTESIS MEDICATIONS: None. COMPLICATIONS: None immediate. PROCEDURE: An ultrasound guided thoracentesis was thoroughly discussed with the patient and questions answered. The benefits, risks, alternatives and complications were also discussed. The patient understands and wishes to proceed with the procedure. Written consent was obtained. Ultrasound was performed to localize and mark an adequate pocket of fluid in the RIGHT chest. The area was then prepped and draped in the normal sterile fashion. 1% Lidocaine was used for local anesthesia. Under ultrasound guidance a 6 Fr Safe-T-Centesis catheter was introduced. Thoracentesis was performed. The catheter was removed and a dressing applied.  FINDINGS: RIGHT pleural effusion is markedly loculated by numerous septations. A total of approximately 500 mL of serosanguinous RIGHT pleural fluid was removed. Samples were sent to the laboratory as requested by the clinical team. IMPRESSION: Successful ultrasound guided RIGHT thoracentesis yielding 500 mL of pleural fluid. Markedly complex RIGHT pleural effusion with numerous septations and loculations. Electronically Signed   By: MaLavonia Dana.D.   On: 07/26/2022 10:29   DG Chest 1 View  Result Date: 07/26/2022 CLINICAL DATA:  Post thoracentesis EXAM: CHEST  1 VIEW COMPARISON:  07/25/2022 FINDINGS: Moderate right sub pulmonic pleural effusion again noted, unchanged. No pneumothorax. Bilateral nodules again seen, unchanged. Heart is normal size. IMPRESSION: No significant change since prior study.  No pneumothorax. Electronically Signed   By: KeRolm Baptise.D.   On: 07/26/2022 10:19   CT Angio Chest PE W and/or Wo Contrast  Result Date: 07/26/2022 CLINICAL DATA:  Pulmonary embolism suspected, high probability. Shortness of breath. EXAM:  CT ANGIOGRAPHY CHEST WITH CONTRAST TECHNIQUE: Multidetector CT imaging of the chest was performed using the standard protocol during bolus administration of intravenous contrast. Multiplanar CT image reconstructions and MIPs were obtained to evaluate the vascular anatomy. RADIATION DOSE REDUCTION: This exam was performed according to the departmental dose-optimization program which includes automated exposure control, adjustment of the mA and/or kV according to patient size and/or use of iterative reconstruction technique. CONTRAST:  119m OMNIPAQUE IOHEXOL 350 MG/ML SOLN COMPARISON:  04/14/2022. FINDINGS: Cardiovascular: Heart is normal in size and there is no pericardial effusion. There is mild atherosclerotic calcification of the aorta without evidence of aneurysm. The pulmonary trunk is normal in caliber. No definite evidence of pulmonary embolism. Evaluation of the  segmental and subsegmental arteries is limited due to extensive nodules and infiltrates. Mediastinum/Nodes: Enlarged lymph nodes are present in the mediastinum measuring up to 1.1 cm in the right paratracheal space. Hilar lymphadenopathy is noted bilaterally. No axillary lymphadenopathy. The thyroid gland, trachea, and esophagus are within normal limits. Lungs/Pleura: Diffuse scattered pulmonary nodules and parenchymal opacities are present in the lungs, increased in size and number from the prior exam. There is a small pleural effusion on the left and a moderate loculated pleural effusion on the right with right basilar compressive atelectasis or infiltrate. Pleural thickening is noted on the right with a nodular opacity in the anterior aspect of the right upper lobe extending into the mediastinal fat measuring 4.7 x 1.8 cm, axial image 42. No pneumothorax. Upper Abdomen: Adrenal nodules are present bilaterally and new from the previous exam. Musculoskeletal: A subareolar nodule is noted in the right breast measuring 3.1 cm. There is a nodule in the anterior chest wall on the right measuring 1 cm, axial image 26. Neurostimulator leads terminate in the thoracic spinal canal. No acute or suspicious osseous abnormality. Review of the MIP images confirms the above findings. IMPRESSION: 1. No definite evidence of pulmonary embolism. Evaluation of the segmental and subsegmental arteries is limited due to pulmonary nodules and infiltrates. 2. Marked interval worsening of pulmonary nodules and parenchymal opacities bilaterally, compatible with known metastatic disease. Pleural thickening is also noted on the right with nodules extending into the mediastinal fat on the right, likely pleural metastasis. 3. Small left pleural effusion and moderate loculated right pleural effusion. 4. Compressive atelectasis versus infiltrate at the right lung base. 5. Bilateral adrenal nodules, new from the previous exam and suspicious for  metastatic disease. 6. Subcutaneous nodules in the anterior chest wall on the right, suspicious for metastatic disease. 7. Aortic atherosclerosis. Electronically Signed   By: LBrett FairyM.D.   On: 07/26/2022 01:13   DG Chest Port 1 View  Result Date: 07/25/2022 CLINICAL DATA:  Questionable sepsis, shortness of breath. EXAM: PORTABLE CHEST 1 VIEW COMPARISON:  09/03/2008, 04/14/2022. FINDINGS: The heart size and mediastinal contours are within normal limits. There is atherosclerotic calcification of the aorta. Nodular and patchy airspace opacities are present in the lungs bilaterally. There is a small to moderate right pleural effusion with patchy atelectasis or infiltrate at the right lung base. No pneumothorax. No acute osseous abnormality. IMPRESSION: 1. Scattered nodular opacities bilaterally, compatible with known pulmonary nodular metastasis. 2. Patchy airspace opacities bilaterally with atelectasis or infiltrate at the right lung base. 3. Small to moderate right pleural effusion. Electronically Signed   By: LBrett FairyM.D.   On: 07/25/2022 23:08    Microbiology: Recent Results (from the past 240 hour(s))  Blood Culture (routine x 2)  Status: None (Preliminary result)   Collection Time: 07/25/22 10:58 PM   Specimen: BLOOD RIGHT ARM  Result Value Ref Range Status   Specimen Description BLOOD RIGHT ARM  Final   Special Requests   Final    BOTTLES DRAWN AEROBIC AND ANAEROBIC Blood Culture adequate volume   Culture   Final    NO GROWTH 2 DAYS Performed at [facility], [address], [place], [place] [address]    Report Status PENDING  Incomplete  Blood Culture (routine x 2)     Status: None (Preliminary result)   Collection Time: 07/25/22 11:02 PM   Specimen: BLOOD RIGHT ARM  Result Value Ref Range Status   Specimen Description BLOOD RIGHT ARM  Final   Special Requests   Final    BOTTLES DRAWN AEROBIC AND ANAEROBIC Blood Culture results may not be optimal due to an excessive  volume of blood received in culture bottles   Culture   Final    NO GROWTH 2 DAYS Performed at [facility], [address] [place], [place] [address]    Report Status PENDING  Incomplete  Urine Culture     Status: Abnormal   Collection Time: 07/26/22  4:55 AM   Specimen: In/Out Cath Urine  Result Value Ref Range Status   Specimen Description   Final    IN/OUT CATH URINE Performed at [facility], [address], [place], [place] [address]    Special Requests   Final    NONE Performed at [facility], [address] [place], [place] [address]    Culture MULTIPLE SPECIES PRESENT, SUGGEST RECOLLECTION (A)  Final   Report Status 07/27/2022 FINAL  Final  SARS Coronavirus 2 by RT PCR (hospital order, performed in [facility] hospital lab) *cepheid single result test* Anterior Nasal Swab     Status: None   Collection Time: 07/26/22  8:25 AM   Specimen: Anterior Nasal Swab  Result Value Ref Range Status   SARS Coronavirus 2 by RT PCR NEGATIVE NEGATIVE Final    Comment: (NOTE) SARS-CoV-2 target nucleic acids are NOT DETECTED.  The SARS-CoV-2 RNA is generally detectable in upper and lower respiratory specimens during the acute phase of infection. The lowest concentration of SARS-CoV-2 viral copies this assay can detect is 250 copies / mL. A negative result does not preclude SARS-CoV-2 infection and should not be used as the sole basis for treatment or other patient management decisions.  A negative result may occur with improper specimen collection / handling, submission of specimen other than nasopharyngeal swab, presence of viral mutation(s) within the areas targeted by this assay, and inadequate number of viral copies (<250 copies / mL). A negative result must be combined with clinical observations, patient history, and epidemiological information.  Fact Sheet for Patients:   https://www.patel.info/  Fact Sheet for Healthcare  Providers: https://hall.com/  This test is not yet approved or  cleared by the [place] FDA and has been authorized for detection and/or diagnosis of SARS-CoV-2 by FDA under an Emergency Use Authorization (EUA).  This EUA will remain in effect (meaning this test can be used) for the duration of the COVID-19 declaration under Section 564(b)(1) of the Act, 21 U.S.C. section 360bbb-3(b)(1), unless the authorization is terminated or revoked sooner.  Performed at [facility], [address], [place], [place] [address]   Gram stain     Status: None   Collection Time: 07/26/22  9:45 AM   Specimen: Pleura  Result Value Ref Range Status  Specimen Description PLEURAL  Final   Special Requests NONE  Final   Gram Stain   Final    NO ORGANISMS SEEN WBC PRESENT,BOTH PMN AND MONONUCLEAR CYTOSPIN SMEAR Performed at [facility], [address], [place], [place] [address]    Report Status 07/26/2022 FINAL  Final  Culture, body fluid w Gram Stain-bottle     Status: None (Preliminary result)   Collection Time: 07/26/22  9:45 AM   Specimen: Pleura  Result Value Ref Range Status   Specimen Description PLEURAL RIGHT  Final   Special Requests BOTTLES DRAWN AEROBIC AND ANAEROBIC 10CC  Final   Culture   Final    NO GROWTH 1 DAY Performed at [facility], [address] [place], [place] [address]    Report Status PENDING  Incomplete     Labs: Basic Metabolic Panel: Recent Labs  Lab 07/25/22 2300 07/27/22 0334  NA 133* 135  K 3.5 3.3*  CL 92* 98  CO2 28 28  GLUCOSE 132* 104*  BUN 9 9  CREATININE 0.56* 0.50*  CALCIUM 11.9* 11.4*   Liver Function Tests: Recent Labs  Lab 07/25/22 2300 07/27/22 0334  AST 32 37  ALT 13 18  ALKPHOS 74 62  BILITOT 0.9 0.5  PROT 5.8* 4.8*  ALBUMIN 2.6* 2.2*   No results for input(s): "LIPASE", "AMYLASE" in the last 168 hours. No results for input(s): "AMMONIA" in the last 168 hours. CBC: Recent Labs  Lab 07/25/22 2300  07/27/22 0334  WBC 11.9* 9.7  NEUTROABS 10.8*  --   HGB 13.7 12.9*  HCT 41.0 38.9*  MCV 96.2 97.7  PLT 181 153   Cardiac Enzymes: No results for input(s): "CKTOTAL", "CKMB", "CKMBINDEX", "TROPONINI" in the last 168 hours. BNP: BNP (last 3 results) Recent Labs    07/25/22 2301  BNP 45.0    ProBNP (last 3 results) No results for input(s): "PROBNP" in the last 8760 hours.  CBG: No results for input(s): "GLUCAP" in the last 168 hours.  FURTHER DISCHARGE INSTRUCTIONS:   Get Medicines reviewed and adjusted: Please take all your medications with you for your next visit with your Primary MD   Laboratory/radiological data: Please request your Primary MD to go over all hospital tests and procedure/radiological results at the follow up, please ask your Primary MD to get all Hospital records sent to his/her office.   In some cases, they will be blood work, cultures and biopsy results pending at the time of your discharge. Please request that your primary care M.D. goes through all the records of your hospital data and follows up on these results.   Also Note the following: If you experience worsening of your admission symptoms, develop shortness of breath, life threatening emergency, suicidal or homicidal thoughts you must seek medical attention immediately by calling 911 or calling your MD immediately  if symptoms less severe.   You must read complete instructions/literature along with all the possible adverse reactions/side effects for all the Medicines you take and that have been prescribed to you. Take any new Medicines after you have completely understood and accpet all the possible adverse reactions/side effects.    Do not drive when taking Pain medications or sleeping medications (Benzodaizepines)   Do not take more than prescribed Pain, Sleep and Anxiety Medications. It is not advisable to combine anxiety,sleep and pain medications without talking with your primary care  practitioner   Special Instructions: If you have smoked or chewed Tobacco  in the last 2 yrs please stop  smoking, stop any regular Alcohol  and or any Recreational drug use.   Wear Seat belts while driving.   Please note: You were cared for by a hospitalist during your hospital stay. Once you are discharged, your primary care physician will handle any further medical issues. Please note that NO REFILLS for any discharge medications will be authorized once you are discharged, as it is imperative that you return to your primary care physician (or establish a relationship with a primary care physician if you do not have one) for your post hospital discharge needs so that they can reassess your need for medications and monitor your lab values.     Signed:  Florencia Reasons MD, PhD, FACP  [facility] 07/27/2022, 3:36 PM

## 2022-07-27 NOTE — Plan of Care (Signed)
  Problem: Education: Goal: Knowledge of General Education information will improve Description Including pain rating scale, medication(s)/side effects and non-pharmacologic comfort measures Outcome: Progressing   Problem: Health Behavior/Discharge Planning: Goal: Ability to manage health-related needs will improve Outcome: Progressing   

## 2022-07-29 ENCOUNTER — Other Ambulatory Visit: Payer: Self-pay | Admitting: Family Medicine

## 2022-07-29 ENCOUNTER — Other Ambulatory Visit: Payer: Self-pay

## 2022-07-29 ENCOUNTER — Encounter (HOSPITAL_COMMUNITY): Payer: Self-pay

## 2022-07-29 ENCOUNTER — Emergency Department (HOSPITAL_COMMUNITY)
Admission: EM | Admit: 2022-07-29 | Discharge: 2022-07-30 | Disposition: A | Discharge status: Other Acute Inpatient Hospital | Payer: Medicare Other | Attending: Emergency Medicine | Admitting: Emergency Medicine

## 2022-07-29 ENCOUNTER — Emergency Department (HOSPITAL_COMMUNITY): Payer: Medicare Other

## 2022-07-29 DIAGNOSIS — N182 Chronic kidney disease, stage 2 (mild): Secondary | ICD-10-CM | POA: Insufficient documentation

## 2022-07-29 DIAGNOSIS — R0902 Hypoxemia: Secondary | ICD-10-CM

## 2022-07-29 DIAGNOSIS — C7801 Secondary malignant neoplasm of right lung: Secondary | ICD-10-CM | POA: Diagnosis not present

## 2022-07-29 DIAGNOSIS — I129 Hypertensive chronic kidney disease with stage 1 through stage 4 chronic kidney disease, or unspecified chronic kidney disease: Secondary | ICD-10-CM | POA: Insufficient documentation

## 2022-07-29 DIAGNOSIS — F17200 Nicotine dependence, unspecified, uncomplicated: Secondary | ICD-10-CM | POA: Insufficient documentation

## 2022-07-29 DIAGNOSIS — F5103 Paradoxical insomnia: Secondary | ICD-10-CM

## 2022-07-29 DIAGNOSIS — C799 Secondary malignant neoplasm of unspecified site: Secondary | ICD-10-CM

## 2022-07-29 DIAGNOSIS — C7802 Secondary malignant neoplasm of left lung: Secondary | ICD-10-CM | POA: Insufficient documentation

## 2022-07-29 DIAGNOSIS — C490 Malignant neoplasm of connective and soft tissue of head, face and neck: Secondary | ICD-10-CM | POA: Diagnosis not present

## 2022-07-29 DIAGNOSIS — E876 Hypokalemia: Secondary | ICD-10-CM | POA: Insufficient documentation

## 2022-07-29 LAB — MISC LABCORP TEST (SEND OUT): Labcorp test code: 9985

## 2022-07-29 LAB — COMPREHENSIVE METABOLIC PANEL
ALT: 17 U/L (ref 0–44)
AST: 40 U/L (ref 15–41)
Albumin: 2.4 g/dL — ABNORMAL LOW (ref 3.5–5.0)
Alkaline Phosphatase: 67 U/L (ref 38–126)
Anion gap: 8 (ref 5–15)
BUN: 7 mg/dL — ABNORMAL LOW (ref 8–23)
CO2: 23 mmol/L (ref 22–32)
Calcium: 10 mg/dL (ref 8.9–10.3)
Chloride: 99 mmol/L (ref 98–111)
Creatinine, Ser: 0.42 mg/dL — ABNORMAL LOW (ref 0.61–1.24)
GFR, Estimated: >60 mL/min (ref >60)
Glucose, Bld: 114 mg/dL — ABNORMAL HIGH (ref 70–99)
Potassium: 2.8 mmol/L — ABNORMAL LOW (ref 3.5–5.1)
Sodium: 130 mmol/L — ABNORMAL LOW (ref 135–145)
Total Bilirubin: 0.9 mg/dL (ref 0.3–1.2)
Total Protein: 5.3 g/dL — ABNORMAL LOW (ref 6.5–8.1)

## 2022-07-29 LAB — CBC WITH DIFFERENTIAL/PLATELET
Abs Immature Granulocytes: 0.05 10*3/uL (ref 0.00–0.07)
Basophils Absolute: 0 10*3/uL (ref 0.0–0.1)
Basophils Relative: 0 %
Eosinophils Absolute: 0.1 10*3/uL (ref 0.0–0.5)
Eosinophils Relative: 1 %
HCT: 36.7 % — ABNORMAL LOW (ref 39.0–52.0)
Hemoglobin: 12.6 g/dL — ABNORMAL LOW (ref 13.0–17.0)
Immature Granulocytes: 1 %
Lymphocytes Relative: 4 %
Lymphs Abs: 0.4 10*3/uL — ABNORMAL LOW (ref 0.7–4.0)
MCH: 32.3 pg (ref 26.0–34.0)
MCHC: 34.3 g/dL (ref 30.0–36.0)
MCV: 94.1 fL (ref 80.0–100.0)
Monocytes Absolute: 1.4 10*3/uL — ABNORMAL HIGH (ref 0.1–1.0)
Monocytes Relative: 15 %
Neutro Abs: 7.6 10*3/uL (ref 1.7–7.7)
Neutrophils Relative %: 79 %
Platelets: 144 10*3/uL — ABNORMAL LOW (ref 150–400)
RBC: 3.9 MIL/uL — ABNORMAL LOW (ref 4.22–5.81)
RDW: 12.7 % (ref 11.5–15.5)
WBC: 9.6 10*3/uL (ref 4.0–10.5)
nRBC: 0 % (ref 0.0–0.2)

## 2022-07-29 MED ORDER — POTASSIUM CHLORIDE 10 MEQ/100ML IV SOLN
10.0000 meq | Freq: Once | INTRAVENOUS | Status: AC
  Administered 2022-07-29: 10 meq via INTRAVENOUS
  Filled 2022-07-29: qty 100

## 2022-07-29 MED ORDER — HYDROMORPHONE HCL 1 MG/ML IJ SOLN
1.0000 mg | Freq: Once | INTRAMUSCULAR | Status: AC
  Administered 2022-07-30: 1 mg via INTRAVENOUS
  Filled 2022-07-29: qty 1

## 2022-07-29 MED ORDER — HYDROMORPHONE HCL 1 MG/ML IJ SOLN
1.0000 mg | Freq: Once | INTRAMUSCULAR | Status: AC
  Administered 2022-07-29: 1 mg via INTRAVENOUS
  Filled 2022-07-29: qty 1

## 2022-07-29 MED ORDER — SODIUM CHLORIDE 0.9 % IV SOLN
2.0000 g | Freq: Once | INTRAVENOUS | Status: AC
  Administered 2022-07-30: 2 g via INTRAVENOUS
  Filled 2022-07-29: qty 20

## 2022-07-29 NOTE — ED Provider Notes (Signed)
[facility] EMERGENCY DEPARTMENT Provider Note   CSN: [id number] Arrival date & time: 07/29/22  2140     History  Chief Complaint  Patient presents with   Shortness of [name] [name] is a 66 y.o. male.   Shortness of Breath Patient presents shortness of breath.  History of cancer.  Head and neck cancer is now gone down the lungs.  Recently admitted to the hospital for pneumonia and metastatic cancer with pleural effusion.  Had a thoracentesis done with about 500 cc of fluid removed.  Had been doing okay at home but over the last 2 days has had more shortness of breath.  Particularly worse this afternoon.  Cough with more sputum production.  No fevers.  Also pain is uncontrolled with his pain medicines at home.  States he is due to see his oncologist at [facility] on Tuesday.    Past Medical History:  Diagnosis Date   Acute kidney failure ([place]) 04/2018   Cancer ([place])    CKD (chronic kidney disease), stage II    Complication of anesthesia    DDD (degenerative disc disease), lumbar    History of pancreatitis    20-30 years ago   History of venomous snake bite    rt index finger-   Hypercholesterolemia    Hypertension    Nerve pain    Right leg, resolved with stimulator   PONV (postoperative nausea and vomiting)    S/P insertion of spinal cord stimulator 08/2017   Spinal stenosis    Tobacco abuse     Home Medications Prior to Admission medications   Medication Sig Start Date End Date Taking? Authorizing Provider  Acetaminophen 500 MG capsule Take 500 mg by mouth every 6 (six) hours as needed for pain. 07/22/22 08/21/22  [provider]  amoxicillin-clavulanate (AUGMENTIN) 875-125 MG tablet Take 1 tablet by mouth 2 (two) times daily for 3 days. 07/27/22 07/30/22  Florencia Reasons, MD  guaiFENesin (MUCINEX) 600 MG 12 hr tablet Take 1 tablet (600 mg total) by mouth 2 (two) times daily. 07/27/22   Florencia Reasons, MD  lidocaine (XYLOCAINE) 2 % solution Patient: Mix 1part 2%  viscous lidocaine, 1part H20. Swish & swallow 30m of diluted mixture, 354m before meals and at bedtime, up to QID 04/26/22   Pickenpack-Cousar, AtCarlena SaxNP  morphine (MS CONTIN) 30 MG 12 hr tablet Take 30 mg by mouth 2 (two) times daily as needed for pain. 07/22/22   [provider]  Multiple Vitamins-Minerals (CENTRUM ULTRA MENS) TABS Take 1 tablet by mouth daily.      [provider]  olmesartan-hydrochlorothiazide (BENICAR HCT) 40-25 MG per tablet Take 1 tablet by mouth daily.      [provider]  polyethylene glycol powder (GLYCOLAX/MIRALAX) 17 GM/SCOOP powder Take 1 Container by mouth daily as needed for mild constipation. 07/22/22 10/20/22  [provider]  potassium chloride SA (KLOR-CON M) 20 MEQ tablet Take 20 mEq by mouth daily. 07/12/22   [provider]  rosuvastatin (CRESTOR) 10 MG tablet Take 1 tablet (10 mg total) by mouth daily. 11/24/21   IrBenay PikeMD  senna-docusate (SENOKOT-S) 8.6-50 MG tablet Take 1 tablet by mouth at bedtime as needed for mild constipation. 07/11/22 08/10/22  [provider]  zolpidem (AMBIEN) 5 MG tablet Take 1 tablet (5 mg total) by mouth at bedtime as needed for sleep. 04/13/22   ZaAlvira MondayFNSan Gabriel    Allergies    Patient has no  known allergies.    Review of Systems   Review of Systems  Respiratory:  Positive for shortness of breath.     Physical Exam Updated Vital Signs BP 125/85   Pulse 78   Temp 97.9 F (36.6 C) (Oral)   Resp 20   SpO2 92%  Physical Exam Vitals and nursing note reviewed.  Cardiovascular:     Rate and Rhythm: Normal rate.  Pulmonary:     Comments: Mildly harsh breath sounds. Chest:     Chest wall: Tenderness present.  Musculoskeletal:     Cervical back: Neck supple.     Right lower leg: No edema.     Left lower leg: No edema.  Neurological:     Mental Status: He is alert.     ED Results / Procedures / Treatments   Labs (all labs ordered are listed,  but only abnormal results are displayed) Labs Reviewed  COMPREHENSIVE METABOLIC PANEL - Abnormal; Notable for the following components:      Result Value   Sodium 130 (*)    Potassium 2.8 (*)    Glucose, Bld 114 (*)    BUN 7 (*)    Creatinine, Ser 0.42 (*)    Total Protein 5.3 (*)    Albumin 2.4 (*)    All other components within normal limits  CBC WITH DIFFERENTIAL/PLATELET - Abnormal; Notable for the following components:   RBC 3.90 (*)    Hemoglobin 12.6 (*)    HCT 36.7 (*)    Platelets 144 (*)    Lymphs Abs 0.4 (*)    Monocytes Absolute 1.4 (*)    All other components within normal limits    EKG None  Radiology DG Chest Portable 1 View  Result Date: 07/29/2022 CLINICAL DATA:  Shortness of breath. EXAM: PORTABLE CHEST 1 VIEW COMPARISON:  07/26/2022. FINDINGS: The heart size and mediastinal contours are stable. Multiple scattered nodules are present in the lungs. There is a moderate loculated right pleural effusion with atelectasis or infiltrate. No pneumothorax. No acute osseous abnormality. Neurostimulator leads terminate over the thoracic spinal canal IMPRESSION: 1. Stable moderate loculated right pleural effusion with atelectasis or infiltrate. 2. Multiple pulmonary nodules bilaterally, compatible with known metastatic disease. Electronically Signed   By: Brett Fairy M.D.   On: 07/29/2022 22:38    Procedures Procedures    Medications Ordered in ED Medications  potassium chloride 10 mEq in 100 mL IVPB (10 mEq Intravenous New Bag/Given 07/29/22 2339)  HYDROmorphone (DILAUDID) injection 1 mg (has no administration in time range)  cefTRIAXone (ROCEPHIN) 2 g in sodium chloride 0.9 % 100 mL IVPB (has no administration in time range)  HYDROmorphone (DILAUDID) injection 1 mg (1 mg Intravenous Given 07/29/22 2338)    ED Course/ Medical Decision Making/ A&P                           Medical Decision Making Amount and/or Complexity of Data Reviewed Labs:  ordered. Radiology: ordered.   Patient shortness of breath.  Recent mission to the hospital with lung cancer and pneumonia.  Has finished up antibiotics and now more short of breath.  More sputum production.  Will give Rocephin.  Chest x-ray shows persistent pleural effusion.  With increased sputum however will treat as pneumonia.  Sats of 90% on room air.  He is at rest in bed with this happening.  I think with exertion even talking he gets more dyspneic and will benefit from admission  to the hospital.  Discussed with [facility] and Dr. Leamon Arnt accepted in transfer.  However no beds available tonight and hopefully should have it tomorrow.  Pulmonary embolism felt less likely.  Had recently been worked up for the same.        Final Clinical Impression(s) / ED Diagnoses Final diagnoses:  Hypoxia  Metastatic malignant neoplasm, unspecified site [facility])  Hypokalemia    Rx / DC Orders ED Discharge Orders     None         Davonna Belling, MD 07/29/22 2352

## 2022-07-29 NOTE — ED Triage Notes (Signed)
Pt arrived from home via [facility] w c/o SOB, cough, and left shoulder pain. Pt states recent PNA and recent new dx of Lung ca

## 2022-07-30 DIAGNOSIS — E876 Hypokalemia: Secondary | ICD-10-CM | POA: Diagnosis not present

## 2022-07-30 DIAGNOSIS — C7802 Secondary malignant neoplasm of left lung: Secondary | ICD-10-CM | POA: Diagnosis not present

## 2022-07-30 DIAGNOSIS — R0902 Hypoxemia: Secondary | ICD-10-CM | POA: Diagnosis present

## 2022-07-30 DIAGNOSIS — N182 Chronic kidney disease, stage 2 (mild): Secondary | ICD-10-CM | POA: Diagnosis not present

## 2022-07-30 DIAGNOSIS — F17200 Nicotine dependence, unspecified, uncomplicated: Secondary | ICD-10-CM | POA: Diagnosis not present

## 2022-07-30 DIAGNOSIS — C490 Malignant neoplasm of connective and soft tissue of head, face and neck: Secondary | ICD-10-CM | POA: Diagnosis not present

## 2022-07-30 DIAGNOSIS — C7801 Secondary malignant neoplasm of right lung: Secondary | ICD-10-CM | POA: Diagnosis not present

## 2022-07-30 DIAGNOSIS — I129 Hypertensive chronic kidney disease with stage 1 through stage 4 chronic kidney disease, or unspecified chronic kidney disease: Secondary | ICD-10-CM | POA: Diagnosis not present

## 2022-07-30 LAB — CULTURE, BLOOD (ROUTINE X 2)
Culture: NO GROWTH
Culture: NO GROWTH
Special Requests: ADEQUATE

## 2022-07-30 MED ORDER — ZOLPIDEM TARTRATE 5 MG PO TABS
5.0000 mg | ORAL_TABLET | Freq: Every evening | ORAL | Status: DC | PRN
  Administered 2022-07-30: 5 mg via ORAL
  Filled 2022-07-30: qty 1

## 2022-07-30 MED ORDER — AMOXICILLIN-POT CLAVULANATE 875-125 MG PO TABS
1.0000 | ORAL_TABLET | Freq: Two times a day (BID) | ORAL | Status: DC
  Administered 2022-07-30 (×2): 1 via ORAL
  Filled 2022-07-30 (×2): qty 1

## 2022-07-30 MED ORDER — MORPHINE SULFATE ER 30 MG PO TBCR
30.0000 mg | EXTENDED_RELEASE_TABLET | Freq: Two times a day (BID) | ORAL | Status: DC
  Administered 2022-07-30 (×2): 30 mg via ORAL
  Filled 2022-07-30 (×2): qty 1

## 2022-07-30 MED ORDER — POLYETHYLENE GLYCOL 3350 17 G PO PACK
17.0000 g | PACK | Freq: Once | ORAL | Status: AC
  Administered 2022-07-30: 17 g via ORAL
  Filled 2022-07-30: qty 1

## 2022-07-30 MED ORDER — HYDROMORPHONE HCL 1 MG/ML IJ SOLN
1.0000 mg | INTRAMUSCULAR | Status: DC | PRN
  Administered 2022-07-30: 1 mg via INTRAVENOUS
  Filled 2022-07-30 (×2): qty 1

## 2022-07-30 NOTE — ED Notes (Signed)
Spoke with [facility], still no beds available at this time. Update given to [facility].

## 2022-07-30 NOTE — ED Notes (Signed)
[facility] arrived EDP assessing patient for EMTLA clearance.

## 2022-07-30 NOTE — ED Notes (Signed)
[facility] available to transport patient to [place]. Report given to [facility]. ETA 15 minutes. Patient and wife made aware. [facility] called and patient removed from trnasfer list.

## 2022-07-30 NOTE — ED Notes (Signed)
[facility] called for transport, patient placed on list for transfer. Per [facility] patient probably will not be transferred till tonight after 7.

## 2022-07-30 NOTE — ED Notes (Signed)
Talked to patient's wife, Azavier Creson, with patient's permission. Wife would like updates about patient's condition and transfer if any changes. Home [phone] Cell [phone].

## 2022-07-31 LAB — CULTURE, BODY FLUID W GRAM STAIN -BOTTLE: Culture: NO GROWTH

## 2022-08-01 ENCOUNTER — Encounter: Payer: Self-pay | Admitting: Orthopedic Surgery

## 2022-08-01 ENCOUNTER — Encounter: Payer: No Typology Code available for payment source | Admitting: Orthopedic Surgery

## 2022-08-05 MED FILL — Fentanyl Citrate Preservative Free (PF) Inj 100 MCG/2ML: INTRAMUSCULAR | Qty: 2 | Status: AC

## 2022-09-01 ENCOUNTER — Ambulatory Visit: Payer: No Typology Code available for payment source | Admitting: Hematology and Oncology

## 2022-09-02 DEATH — deceased

## 2022-10-05 ENCOUNTER — Encounter: Payer: No Typology Code available for payment source | Admitting: Family Medicine

## 2022-11-15 NOTE — Therapy (Signed)
[facility] [address], [place], [place], [address] Phone: [phone]   Fax:  [phone]  Patient Details  Name: [name] [name] MRN: [id number] Date of Birth: 02/26/1956 Referring Provider:  Eppie Gibson, MD  Encounter Date: 11/15/2022  SPEECH THERAPY DISCHARGE SUMMARY  Visits from Start of Care: 2  Current functional level related to goals / functional outcomes: Pt transferred care to [facility] when mets was found. Last appointment was April 2023, with the following goals and plan;  LP Short Term Goals - 01/06/22         SLP SHORT TERM GOAL #1      Title Time pt will complete HEP with rare min A   1    Period --   sessions, for all STGs    Status Not met, Ongoing           SLP SHORT TERM GOAL #2    Title   Time Period pt will tell SLP why pt is completing HEP with modified independence x2 visits 1       Status Ongoing           SLP SHORT TERM GOAL #3    Title   Time Period pt will describe 3 overt s/s aspiration PNA with modified independence  1    Status Ongoing           SLP SHORT TERM GOAL #4    Title   Time Period pt will tell SLP how a food journal could hasten return to a more normalized diet  1    Status Ongoing                    SLP Long Term Goals - 01/06/22                      SLP LONG TERM GOAL #1    Title pt will complete HEP with modified independence over three visits     Time 3    Period --   sessions, for all LTGs    Status Ongoing           SLP LONG TERM GOAL #2    Title pt will describe how to modify HEP over time, and the timeline associated with reduction in HEP frequency with modified independence over two sessions     Time Period 5      Status Ongoing                    Plan - 01/06/22      Clinical Impression Statement As above pt swallowing is deemed WNL with items assessed today - regular diet and thin liquid. SLP developed an individualized HEP  for dysphagia and pt completed each exercise on his own initially with rare min assist from SLP faded to pt independence. There are no overt s/s aspiration with POs reported by pt at this time, nor overt s/sx aspiration PNA; Neither oral nor pharyngeal difficulties were detected today when SLP observed pt with POs. Data indicate that pt's swallow ability will likely decrease over the course of radiation therapy and could very well decline over time following conclusion of their radiation therapy due to muscle fibrosis and/or atrophy. Pt will cont to need to be seen by SLP in order to assess safety of PO intake, assess the need for recommending any objective swallow assessment, and ensuring pt correctly completes the individualized HEP.  Speech Therapy Frequency once approx every 4 weeks       Remaining deficits: None. Pt is deceased as of 09/08/2022.   Education / Equipment: HEP, late effects head/neck radiation on swallowing.   Patient agrees to discharge. Patient goals were partially met. Patient is being discharged due to  pt did not return to [place].Marland Kitchen    [place], [place] 11/15/2022, 2:08 PM  [facility] [address], [place], [place], [address] Phone: [phone]   Fax:  [phone]

## 2023-03-12 IMAGING — PT NM PET TUM IMG INITIAL (PI) SKULL BASE T - THIGH
1 of 7 series · 2 of 25 positions shown · non-contrast
Comparison: CT April 07, 2021.

CLINICAL DATA: Initial treatment strategy for tonsillar cancer.

EXAM:
NUCLEAR MEDICINE PET SKULL BASE TO THIGH
TECHNIQUE: 7.6 mCi F-18 FDG was injected intravenously. Full-ring PET imaging
was performed from the skull base to thigh after the radiotracer. CT
data was obtained and used for attenuation correction and anatomic
localization.
Fasting blood glucose: 135 mg/dl

[Series 4: ct hn_sk_th 5.0 bf37 · axial · 5.0mm · 0.98mm/px · z∈[-658,-470]mm · 2 of 236 slices shown]
[im 189/236  brain]
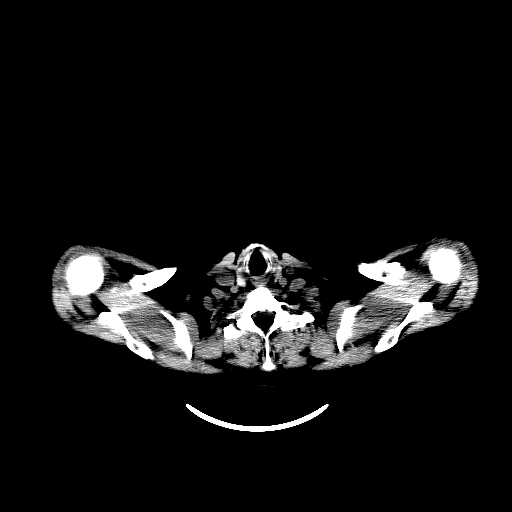
[im 236/236  brain]
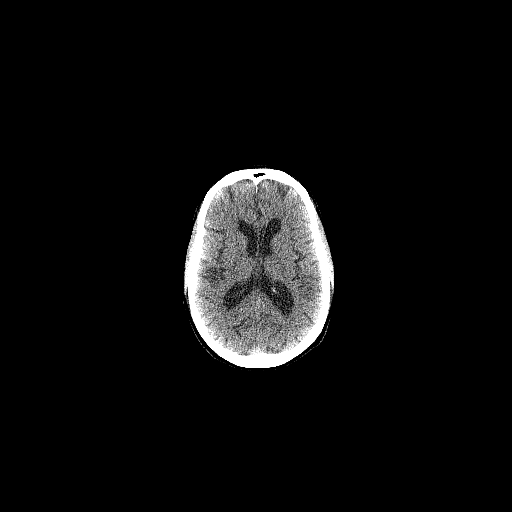

[2 of 25 positions shown; findings below may reference images not displayed]

FINDINGS: Mediastinal blood pool activity: SUV max

Liver activity: SUV max NA

NECK: Hypermetabolic mass centered along the wall of the left
posterolateral oropharynx measuring approximally 20 x 15 mm on image
[DATE] with a max SUV of 12.76.

Hypermetabolic prominent bilateral cervical lymph nodes. For
reference:

-hypermetabolic left level 2a lymph node measures 8 mm on image 35/4
with a max SUV of 6.3.

-hypermetabolic right level 2a lymph node measures 6 mm on image
32/4 with a max SUV of 4.4.

Incidental CT findings: Streak artifact from dental hardware.
Mucosal thickening of the right maxillary sinus.

CHEST: No hypermetabolic mediastinal or hilar nodes. No suspicious
pulmonary nodules on the CT scan.

Incidental CT findings: No suspicious pulmonary nodules. No pleural
effusion. No pneumothorax. Aortic and branch vessel atherosclerosis
without thoracic aortic aneurysm.

ABDOMEN/PELVIS: No abnormal hypermetabolic activity within the
liver, pancreas, adrenal glands, or spleen. No hypermetabolic lymph
nodes in the abdomen or pelvis.

Incidental CT findings: Diffuse hepatic steatosis with focal fatty
sparing along the gallbladder fossa. Gallbladder is unremarkable. No
biliary ductal dilation. Atrophic pancreas with numerous
calcifications throughout the gland, consistent with sequela of
chronic pancreatitis. No hydronephrosis. No evidence of bowel
obstruction or acute bowel inflammation. Aortic and branch vessel
atherosclerosis without abdominal aortic aneurysm. Left posterior
paraspinal stimulator generator with lead tip in the spinal canal.

SKELETON: No focal hypermetabolic activity to suggest skeletal
metastasis.

Incidental CT findings: Multilevel degenerative changes spine.
Degenerative change of the SI joints with partial bony ankylosis.
Right total hip arthroplasty. No aggressive lytic or blastic lesion
of bone.
IMPRESSION: 1. Hypermetabolic mass centered along the wall of the left
posterolateral oropharynx, consistent with known tonsillar neoplasm.

2. Hypermetabolic bilateral cervical lymph nodes, suspicious for
disease involvement.

3. No evidence of hypermetabolic metastatic disease in the chest,
abdomen, or pelvis.

4.   Additional ancillary chronic findings described above.
